# Patient Record
Sex: Female | Born: 1935 | Race: White | Hispanic: No | Marital: Married | Smoking: Former smoker
Health system: Southern US, Community
[De-identification: ages and names within clinical notes are randomized; demographics above are authoritative.]

## PROBLEM LIST (undated history)

## (undated) DIAGNOSIS — R600 Localized edema: Secondary | ICD-10-CM

## (undated) DIAGNOSIS — E039 Hypothyroidism, unspecified: Secondary | ICD-10-CM

## (undated) DIAGNOSIS — M549 Dorsalgia, unspecified: Secondary | ICD-10-CM

## (undated) DIAGNOSIS — R8281 Pyuria: Secondary | ICD-10-CM

## (undated) DIAGNOSIS — D500 Iron deficiency anemia secondary to blood loss (chronic): Secondary | ICD-10-CM

## (undated) DIAGNOSIS — I10 Essential (primary) hypertension: Secondary | ICD-10-CM

## (undated) DIAGNOSIS — M160 Bilateral primary osteoarthritis of hip: Secondary | ICD-10-CM

## (undated) DIAGNOSIS — Z9289 Personal history of other medical treatment: Secondary | ICD-10-CM

## (undated) DIAGNOSIS — K644 Residual hemorrhoidal skin tags: Secondary | ICD-10-CM

## (undated) DIAGNOSIS — K219 Gastro-esophageal reflux disease without esophagitis: Secondary | ICD-10-CM

## (undated) DIAGNOSIS — K921 Melena: Secondary | ICD-10-CM

## (undated) DIAGNOSIS — M47816 Spondylosis without myelopathy or radiculopathy, lumbar region: Secondary | ICD-10-CM

## (undated) DIAGNOSIS — M1990 Unspecified osteoarthritis, unspecified site: Secondary | ICD-10-CM

## (undated) DIAGNOSIS — K625 Hemorrhage of anus and rectum: Secondary | ICD-10-CM

## (undated) DIAGNOSIS — D649 Anemia, unspecified: Secondary | ICD-10-CM

## (undated) DIAGNOSIS — Z8719 Personal history of other diseases of the digestive system: Secondary | ICD-10-CM

## (undated) DIAGNOSIS — Z780 Asymptomatic menopausal state: Secondary | ICD-10-CM

## (undated) DIAGNOSIS — M129 Arthropathy, unspecified: Secondary | ICD-10-CM

## (undated) DIAGNOSIS — J449 Chronic obstructive pulmonary disease, unspecified: Secondary | ICD-10-CM

## (undated) DIAGNOSIS — M810 Age-related osteoporosis without current pathological fracture: Secondary | ICD-10-CM

## (undated) DIAGNOSIS — Z8673 Personal history of transient ischemic attack (TIA), and cerebral infarction without residual deficits: Secondary | ICD-10-CM

## (undated) DIAGNOSIS — Z7901 Long term (current) use of anticoagulants: Secondary | ICD-10-CM

## (undated) DIAGNOSIS — G4700 Insomnia, unspecified: Secondary | ICD-10-CM

## (undated) DIAGNOSIS — M170 Bilateral primary osteoarthritis of knee: Secondary | ICD-10-CM

## (undated) DIAGNOSIS — R609 Edema, unspecified: Secondary | ICD-10-CM

## (undated) DIAGNOSIS — K2960 Other gastritis without bleeding: Secondary | ICD-10-CM

## (undated) DIAGNOSIS — I639 Cerebral infarction, unspecified: Secondary | ICD-10-CM

## (undated) DIAGNOSIS — E0590 Thyrotoxicosis, unspecified without thyrotoxic crisis or storm: Secondary | ICD-10-CM

## (undated) DIAGNOSIS — I480 Paroxysmal atrial fibrillation: Secondary | ICD-10-CM

## (undated) DIAGNOSIS — Z8639 Personal history of other endocrine, nutritional and metabolic disease: Secondary | ICD-10-CM

## (undated) DIAGNOSIS — N189 Chronic kidney disease, unspecified: Secondary | ICD-10-CM

## (undated) HISTORY — PX: BREAST ENHANCEMENT SURGERY: SHX7

## (undated) HISTORY — DX: Age-related osteoporosis without current pathological fracture: M81.0

## (undated) HISTORY — DX: Long term (current) use of anticoagulants: Z79.01

## (undated) HISTORY — DX: Arthropathy, unspecified: M12.9

## (undated) HISTORY — DX: Bilateral primary osteoarthritis of knee: M17.0

## (undated) HISTORY — DX: Thyrotoxicosis, unspecified without thyrotoxic crisis or storm: E05.90

## (undated) HISTORY — DX: Personal history of transient ischemic attack (TIA), and cerebral infarction without residual deficits: Z86.73

## (undated) HISTORY — DX: Melena: K92.1

## (undated) HISTORY — DX: Iron deficiency anemia secondary to blood loss (chronic): D50.0

## (undated) HISTORY — DX: Chronic kidney disease, unspecified: N18.9

## (undated) HISTORY — PX: COLONOSCOPY: SHX174

## (undated) HISTORY — PX: CHOLECYSTECTOMY: SHX55

## (undated) HISTORY — DX: Cerebral infarction, unspecified: I63.9

## (undated) HISTORY — DX: Hemorrhage of anus and rectum: K62.5

## (undated) HISTORY — DX: Spondylosis without myelopathy or radiculopathy, lumbar region: M47.816

## (undated) HISTORY — DX: Insomnia, unspecified: G47.00

## (undated) HISTORY — PX: ABDOMINAL HYSTERECTOMY: SHX81

## (undated) HISTORY — DX: Personal history of other diseases of the digestive system: Z87.19

## (undated) HISTORY — PX: OPERATIVE HYSTEROSCOPY: SUR664

## (undated) HISTORY — DX: Pyuria: R82.81

## (undated) HISTORY — DX: Asymptomatic menopausal state: Z78.0

## (undated) HISTORY — DX: Bilateral primary osteoarthritis of hip: M16.0

## (undated) HISTORY — DX: Localized edema: R60.0

## (undated) HISTORY — DX: Residual hemorrhoidal skin tags: K64.4

## (undated) HISTORY — DX: Other gastritis without bleeding: K29.60

## (undated) HISTORY — DX: Paroxysmal atrial fibrillation: I48.0

## (undated) HISTORY — DX: Edema, unspecified: R60.9

## (undated) HISTORY — PX: WISDOM TOOTH EXTRACTION: SHX21

## (undated) HISTORY — PX: HEMORRHOID SURGERY: SHX153

## (undated) HISTORY — DX: Personal history of other medical treatment: Z92.89

---

## 1999-03-04 ENCOUNTER — Emergency Department (HOSPITAL_COMMUNITY): Admission: EM | Admit: 1999-03-04 | Discharge: 1999-03-04 | Payer: Self-pay

## 2001-05-05 ENCOUNTER — Encounter (INDEPENDENT_AMBULATORY_CARE_PROVIDER_SITE_OTHER): Payer: Self-pay | Admitting: *Deleted

## 2001-05-05 ENCOUNTER — Ambulatory Visit (HOSPITAL_BASED_OUTPATIENT_CLINIC_OR_DEPARTMENT_OTHER): Admission: RE | Admit: 2001-05-05 | Discharge: 2001-05-06 | Payer: Self-pay | Admitting: Surgery

## 2005-09-19 ENCOUNTER — Inpatient Hospital Stay (HOSPITAL_COMMUNITY): Admission: AD | Admit: 2005-09-19 | Discharge: 2005-09-20 | Payer: Self-pay | Admitting: Cardiology

## 2008-08-09 ENCOUNTER — Other Ambulatory Visit: Admission: RE | Admit: 2008-08-09 | Discharge: 2008-08-09 | Payer: Self-pay | Admitting: Surgery

## 2009-03-18 ENCOUNTER — Inpatient Hospital Stay (HOSPITAL_COMMUNITY): Admission: EM | Admit: 2009-03-18 | Discharge: 2009-03-21 | Payer: Self-pay | Admitting: Emergency Medicine

## 2009-03-18 ENCOUNTER — Encounter: Payer: Self-pay | Admitting: Cardiology

## 2010-06-30 DIAGNOSIS — Z8673 Personal history of transient ischemic attack (TIA), and cerebral infarction without residual deficits: Secondary | ICD-10-CM

## 2010-06-30 HISTORY — DX: Personal history of transient ischemic attack (TIA), and cerebral infarction without residual deficits: Z86.73

## 2010-07-11 ENCOUNTER — Ambulatory Visit: Payer: Self-pay | Admitting: Vascular Surgery

## 2010-07-11 ENCOUNTER — Inpatient Hospital Stay (HOSPITAL_COMMUNITY): Admission: EM | Admit: 2010-07-11 | Discharge: 2010-07-13 | Payer: Self-pay | Admitting: Emergency Medicine

## 2010-07-11 ENCOUNTER — Encounter (INDEPENDENT_AMBULATORY_CARE_PROVIDER_SITE_OTHER): Payer: Self-pay | Admitting: Internal Medicine

## 2010-07-13 ENCOUNTER — Encounter (INDEPENDENT_AMBULATORY_CARE_PROVIDER_SITE_OTHER): Payer: Self-pay | Admitting: Neurology

## 2010-07-19 ENCOUNTER — Encounter: Admission: RE | Admit: 2010-07-19 | Discharge: 2010-08-09 | Payer: Self-pay | Admitting: Family Medicine

## 2010-08-01 ENCOUNTER — Ambulatory Visit: Payer: Self-pay | Admitting: Cardiology

## 2010-08-08 ENCOUNTER — Ambulatory Visit: Payer: Self-pay | Admitting: Cardiology

## 2010-08-24 ENCOUNTER — Ambulatory Visit: Payer: Self-pay | Admitting: Cardiology

## 2010-09-08 ENCOUNTER — Ambulatory Visit: Payer: Self-pay | Admitting: Cardiology

## 2010-10-05 ENCOUNTER — Ambulatory Visit: Payer: Self-pay | Admitting: Cardiology

## 2010-10-23 ENCOUNTER — Ambulatory Visit: Payer: Self-pay | Admitting: Internal Medicine

## 2010-10-23 ENCOUNTER — Inpatient Hospital Stay (HOSPITAL_COMMUNITY): Admission: EM | Admit: 2010-10-23 | Discharge: 2010-10-26 | Payer: Self-pay | Admitting: Emergency Medicine

## 2010-11-13 ENCOUNTER — Ambulatory Visit: Payer: Self-pay | Admitting: Cardiology

## 2010-12-05 ENCOUNTER — Inpatient Hospital Stay (HOSPITAL_COMMUNITY)
Admission: EM | Admit: 2010-12-05 | Discharge: 2010-12-08 | Payer: Self-pay | Source: Home / Self Care | Attending: Internal Medicine | Admitting: Internal Medicine

## 2010-12-13 ENCOUNTER — Ambulatory Visit: Payer: Self-pay | Admitting: Cardiology

## 2010-12-20 ENCOUNTER — Ambulatory Visit: Payer: Self-pay | Admitting: Cardiology

## 2011-01-04 ENCOUNTER — Ambulatory Visit: Payer: Self-pay | Admitting: Cardiovascular Disease

## 2011-01-10 ENCOUNTER — Encounter
Admission: RE | Admit: 2011-01-10 | Discharge: 2011-01-10 | Payer: Self-pay | Source: Home / Self Care | Attending: Endocrinology | Admitting: Endocrinology

## 2011-01-19 ENCOUNTER — Ambulatory Visit: Payer: Self-pay | Admitting: Cardiology

## 2011-01-21 ENCOUNTER — Encounter: Payer: Self-pay | Admitting: Endocrinology

## 2011-01-30 ENCOUNTER — Ambulatory Visit: Payer: Self-pay | Admitting: Cardiology

## 2011-02-14 ENCOUNTER — Ambulatory Visit (INDEPENDENT_AMBULATORY_CARE_PROVIDER_SITE_OTHER): Payer: Medicare Other | Admitting: Cardiology

## 2011-02-14 DIAGNOSIS — D509 Iron deficiency anemia, unspecified: Secondary | ICD-10-CM

## 2011-02-14 DIAGNOSIS — I4891 Unspecified atrial fibrillation: Secondary | ICD-10-CM

## 2011-02-14 DIAGNOSIS — I6350 Cerebral infarction due to unspecified occlusion or stenosis of unspecified cerebral artery: Secondary | ICD-10-CM

## 2011-02-14 DIAGNOSIS — Z7901 Long term (current) use of anticoagulants: Secondary | ICD-10-CM

## 2011-03-13 LAB — MAGNESIUM: Magnesium: 1.9 mg/dL (ref 1.5–2.5)

## 2011-03-13 LAB — GLUCOSE, CAPILLARY
Glucose-Capillary: 100 mg/dL — ABNORMAL HIGH (ref 70–99)
Glucose-Capillary: 101 mg/dL — ABNORMAL HIGH (ref 70–99)
Glucose-Capillary: 109 mg/dL — ABNORMAL HIGH (ref 70–99)
Glucose-Capillary: 114 mg/dL — ABNORMAL HIGH (ref 70–99)
Glucose-Capillary: 68 mg/dL — ABNORMAL LOW (ref 70–99)
Glucose-Capillary: 87 mg/dL (ref 70–99)
Glucose-Capillary: 92 mg/dL (ref 70–99)

## 2011-03-13 LAB — CBC
HCT: 22.3 % — ABNORMAL LOW (ref 36.0–46.0)
HCT: 23.5 % — ABNORMAL LOW (ref 36.0–46.0)
HCT: 27.6 % — ABNORMAL LOW (ref 36.0–46.0)
HCT: 29.6 % — ABNORMAL LOW (ref 36.0–46.0)
Hemoglobin: 7.2 g/dL — ABNORMAL LOW (ref 12.0–15.0)
Hemoglobin: 7.5 g/dL — ABNORMAL LOW (ref 12.0–15.0)
MCH: 28.3 pg (ref 26.0–34.0)
MCH: 28.7 pg (ref 26.0–34.0)
MCH: 29.4 pg (ref 26.0–34.0)
MCHC: 31.9 g/dL (ref 30.0–36.0)
MCHC: 32.3 g/dL (ref 30.0–36.0)
MCHC: 32.6 g/dL (ref 30.0–36.0)
MCV: 87.6 fL (ref 78.0–100.0)
MCV: 87.7 fL (ref 78.0–100.0)
MCV: 88.7 fL (ref 78.0–100.0)
MCV: 89 fL (ref 78.0–100.0)
MCV: 91 fL (ref 78.0–100.0)
Platelets: 212 K/uL (ref 150–400)
Platelets: 234 10*3/uL (ref 150–400)
Platelets: 251 10*3/uL (ref 150–400)
Platelets: 268 10*3/uL (ref 150–400)
RBC: 2.65 MIL/uL — ABNORMAL LOW (ref 3.87–5.11)
RBC: 3.38 MIL/uL — ABNORMAL LOW (ref 3.87–5.11)
RDW: 13.6 % (ref 11.5–15.5)
RDW: 14.4 % (ref 11.5–15.5)
WBC: 4.7 K/uL (ref 4.0–10.5)
WBC: 4.8 10*3/uL (ref 4.0–10.5)
WBC: 4.9 10*3/uL (ref 4.0–10.5)
WBC: 6.1 10*3/uL (ref 4.0–10.5)

## 2011-03-13 LAB — BASIC METABOLIC PANEL
BUN: 10 mg/dL (ref 6–23)
CO2: 24 mEq/L (ref 19–32)
CO2: 25 mEq/L (ref 19–32)
Calcium: 9.5 mg/dL (ref 8.4–10.5)
Chloride: 107 mEq/L (ref 96–112)
Creatinine, Ser: 0.96 mg/dL (ref 0.4–1.2)
Creatinine, Ser: 1.01 mg/dL (ref 0.4–1.2)
GFR calc Af Amer: >60 mL/min (ref >60)
GFR calc non Af Amer: 41 mL/min — ABNORMAL LOW (ref >60)
GFR calc non Af Amer: 57 mL/min — ABNORMAL LOW (ref >60)
Potassium: 4 mEq/L (ref 3.5–5.1)
Sodium: 138 mEq/L (ref 135–145)

## 2011-03-13 LAB — DIFFERENTIAL
Basophils Absolute: 0.1 10*3/uL (ref 0.0–0.1)
Basophils Absolute: 0.1 K/uL (ref 0.0–0.1)
Basophils Relative: 1 % (ref 0–1)
Basophils Relative: 1 % (ref 0–1)
Eosinophils Absolute: 0.2 K/uL (ref 0.0–0.7)
Eosinophils Relative: 3 % (ref 0–5)
Eosinophils Relative: 4 % (ref 0–5)
Lymphocytes Relative: 40 % (ref 12–46)
Lymphs Abs: 1.9 10*3/uL (ref 0.7–4.0)
Lymphs Abs: 2.1 10*3/uL (ref 0.7–4.0)
Monocytes Absolute: 0.5 K/uL (ref 0.1–1.0)
Monocytes Relative: 10 % (ref 3–12)
Monocytes Relative: 8 % (ref 3–12)
Neutro Abs: 2.2 K/uL (ref 1.7–7.7)
Neutrophils Relative %: 46 % (ref 43–77)
Neutrophils Relative %: 53 % (ref 43–77)

## 2011-03-13 LAB — COMPREHENSIVE METABOLIC PANEL
AST: 32 U/L (ref 0–37)
Albumin: 3.4 g/dL — ABNORMAL LOW (ref 3.5–5.2)
Alkaline Phosphatase: 27 U/L — ABNORMAL LOW (ref 39–117)
Calcium: 9 mg/dL (ref 8.4–10.5)
Chloride: 110 mEq/L (ref 96–112)
Glucose, Bld: 86 mg/dL (ref 70–99)
Potassium: 3.2 mEq/L — ABNORMAL LOW (ref 3.5–5.1)

## 2011-03-13 LAB — CROSSMATCH
ABO/RH(D): O POS
Antibody Screen: NEGATIVE
Unit division: 0

## 2011-03-13 LAB — BRAIN NATRIURETIC PEPTIDE: Pro B Natriuretic peptide (BNP): 492 pg/mL — ABNORMAL HIGH (ref 0.0–100.0)

## 2011-03-13 LAB — PROTIME-INR
INR: 2.62 — ABNORMAL HIGH (ref 0.00–1.49)
INR: 3.18 — ABNORMAL HIGH (ref 0.00–1.49)
Prothrombin Time: 29.6 seconds — ABNORMAL HIGH (ref 11.6–15.2)
Prothrombin Time: 32.6 seconds — ABNORMAL HIGH (ref 11.6–15.2)

## 2011-03-13 LAB — APTT: aPTT: 64 seconds — ABNORMAL HIGH (ref 24–37)

## 2011-03-14 ENCOUNTER — Encounter (INDEPENDENT_AMBULATORY_CARE_PROVIDER_SITE_OTHER): Payer: Medicare Other

## 2011-03-14 DIAGNOSIS — Z7901 Long term (current) use of anticoagulants: Secondary | ICD-10-CM

## 2011-03-14 DIAGNOSIS — I4891 Unspecified atrial fibrillation: Secondary | ICD-10-CM

## 2011-03-14 LAB — PREPARE FRESH FROZEN PLASMA: Unit division: 0

## 2011-03-14 LAB — DIFFERENTIAL
Basophils Absolute: 0.1 10*3/uL (ref 0.0–0.1)
Basophils Relative: 1 % (ref 0–1)
Eosinophils Absolute: 0.1 10*3/uL (ref 0.0–0.7)
Neutro Abs: 4.9 10*3/uL (ref 1.7–7.7)
Neutrophils Relative %: 64 % (ref 43–77)

## 2011-03-14 LAB — CROSSMATCH
Antibody Screen: NEGATIVE
Unit division: 0
Unit division: 0
Unit division: 0

## 2011-03-14 LAB — VITAMIN B12: Vitamin B-12: 713 pg/mL (ref 211–911)

## 2011-03-14 LAB — BASIC METABOLIC PANEL
BUN: 37 mg/dL — ABNORMAL HIGH (ref 6–23)
CO2: 25 mEq/L (ref 19–32)
CO2: 29 mEq/L (ref 19–32)
Calcium: 9.2 mg/dL (ref 8.4–10.5)
Chloride: 108 mEq/L (ref 96–112)
Creatinine, Ser: 0.85 mg/dL (ref 0.4–1.2)
Creatinine, Ser: 1.04 mg/dL (ref 0.4–1.2)
GFR calc Af Amer: >60 mL/min (ref >60)
Glucose, Bld: 101 mg/dL — ABNORMAL HIGH (ref 70–99)

## 2011-03-14 LAB — COMPREHENSIVE METABOLIC PANEL
ALT: 17 U/L (ref 0–35)
Alkaline Phosphatase: 25 U/L — ABNORMAL LOW (ref 39–117)
BUN: 36 mg/dL — ABNORMAL HIGH (ref 6–23)
CO2: 29 mEq/L (ref 19–32)
Chloride: 106 mEq/L (ref 96–112)
Glucose, Bld: 94 mg/dL (ref 70–99)
Potassium: 4.5 mEq/L (ref 3.5–5.1)
Total Bilirubin: 0.6 mg/dL (ref 0.3–1.2)

## 2011-03-14 LAB — ABO/RH: ABO/RH(D): O POS

## 2011-03-14 LAB — PROTIME-INR
INR: 1.3 (ref 0.00–1.49)
INR: 1.35 (ref 0.00–1.49)
Prothrombin Time: 16.4 seconds — ABNORMAL HIGH (ref 11.6–15.2)
Prothrombin Time: 16.9 seconds — ABNORMAL HIGH (ref 11.6–15.2)

## 2011-03-14 LAB — CBC
HCT: 24.8 % — ABNORMAL LOW (ref 36.0–46.0)
Hemoglobin: 8.5 g/dL — ABNORMAL LOW (ref 12.0–15.0)
Hemoglobin: 9.7 g/dL — ABNORMAL LOW (ref 12.0–15.0)
MCH: 30.6 pg (ref 26.0–34.0)
MCH: 30.9 pg (ref 26.0–34.0)
MCHC: 34.5 g/dL (ref 30.0–36.0)
MCHC: 34.6 g/dL (ref 30.0–36.0)
MCV: 88.5 fL (ref 78.0–100.0)
MCV: 90.2 fL (ref 78.0–100.0)
MCV: 90.5 fL (ref 78.0–100.0)
MCV: 91.7 fL (ref 78.0–100.0)
Platelets: 146 10*3/uL — ABNORMAL LOW (ref 150–400)
Platelets: 149 10*3/uL — ABNORMAL LOW (ref 150–400)
Platelets: 165 10*3/uL (ref 150–400)
Platelets: 173 10*3/uL (ref 150–400)
Platelets: 175 10*3/uL (ref 150–400)
RBC: 2.04 MIL/uL — ABNORMAL LOW (ref 3.87–5.11)
RBC: 2.74 MIL/uL — ABNORMAL LOW (ref 3.87–5.11)
RBC: 2.97 MIL/uL — ABNORMAL LOW (ref 3.87–5.11)
RBC: 3.01 MIL/uL — ABNORMAL LOW (ref 3.87–5.11)
RDW: 15 % (ref 11.5–15.5)
RDW: 15.3 % (ref 11.5–15.5)
RDW: 15.3 % (ref 11.5–15.5)
WBC: 4.9 10*3/uL (ref 4.0–10.5)
WBC: 5.8 10*3/uL (ref 4.0–10.5)
WBC: 7.6 10*3/uL (ref 4.0–10.5)

## 2011-03-14 LAB — URINALYSIS, ROUTINE W REFLEX MICROSCOPIC
Glucose, UA: NEGATIVE mg/dL
Hgb urine dipstick: NEGATIVE
Specific Gravity, Urine: 1.012 (ref 1.005–1.030)
Urobilinogen, UA: 1 mg/dL (ref 0.0–1.0)

## 2011-03-14 LAB — APTT
aPTT: 38 seconds — ABNORMAL HIGH (ref 24–37)
aPTT: 68 seconds — ABNORMAL HIGH (ref 24–37)

## 2011-03-14 LAB — POCT CARDIAC MARKERS
CKMB, poc: <1 ng/mL — ABNORMAL LOW (ref 1.0–8.0)
CKMB, poc: <1 ng/mL — ABNORMAL LOW (ref 1.0–8.0)
Myoglobin, poc: 33.8 ng/mL (ref 12–200)
Myoglobin, poc: 35.5 ng/mL (ref 12–200)
Troponin i, poc: <0.05 ng/mL (ref 0.00–0.09)

## 2011-03-14 LAB — TSH: TSH: 1.244 u[IU]/mL (ref 0.350–4.500)

## 2011-03-14 LAB — PREPARE RBC (CROSSMATCH)

## 2011-03-14 LAB — GLUCOSE, CAPILLARY
Glucose-Capillary: 109 mg/dL — ABNORMAL HIGH (ref 70–99)
Glucose-Capillary: 111 mg/dL — ABNORMAL HIGH (ref 70–99)
Glucose-Capillary: 86 mg/dL (ref 70–99)

## 2011-03-14 LAB — MRSA PCR SCREENING: MRSA by PCR: NEGATIVE

## 2011-03-14 LAB — URINE CULTURE: Culture: NO GROWTH

## 2011-03-14 LAB — IRON AND TIBC
Saturation Ratios: 29 % (ref 20–55)
TIBC: 411 ug/dL (ref 250–470)
UIBC: 293 ug/dL

## 2011-03-14 LAB — FERRITIN: Ferritin: 29 ng/mL (ref 10–291)

## 2011-03-14 LAB — FOLATE: Folate: >20 ng/mL

## 2011-03-18 LAB — URINALYSIS, ROUTINE W REFLEX MICROSCOPIC
Ketones, ur: NEGATIVE mg/dL
Nitrite: NEGATIVE
Protein, ur: NEGATIVE mg/dL
Urobilinogen, UA: 0.2 mg/dL (ref 0.0–1.0)

## 2011-03-18 LAB — GLUCOSE, CAPILLARY: Glucose-Capillary: 130 mg/dL — ABNORMAL HIGH (ref 70–99)

## 2011-03-18 LAB — BASIC METABOLIC PANEL
BUN: 22 mg/dL (ref 6–23)
BUN: 6 mg/dL (ref 6–23)
BUN: 9 mg/dL (ref 6–23)
Creatinine, Ser: 0.81 mg/dL (ref 0.4–1.2)
Creatinine, Ser: 1.55 mg/dL — ABNORMAL HIGH (ref 0.4–1.2)
GFR calc Af Amer: >60 mL/min (ref >60)
GFR calc Af Amer: >60 mL/min (ref >60)
GFR calc non Af Amer: 33 mL/min — ABNORMAL LOW (ref >60)
GFR calc non Af Amer: >60 mL/min (ref >60)
GFR calc non Af Amer: >60 mL/min (ref >60)
Glucose, Bld: 110 mg/dL — ABNORMAL HIGH (ref 70–99)
Potassium: 3.1 mEq/L — ABNORMAL LOW (ref 3.5–5.1)
Potassium: 3.4 mEq/L — ABNORMAL LOW (ref 3.5–5.1)
Sodium: 140 mEq/L (ref 135–145)

## 2011-03-18 LAB — DIFFERENTIAL
Basophils Absolute: 0 10*3/uL (ref 0.0–0.1)
Basophils Relative: 1 % (ref 0–1)
Eosinophils Relative: 3 % (ref 0–5)
Lymphocytes Relative: 31 % (ref 12–46)
Monocytes Absolute: 0.5 10*3/uL (ref 0.1–1.0)
Neutro Abs: 4.3 10*3/uL (ref 1.7–7.7)

## 2011-03-18 LAB — CBC
HCT: 39.6 % (ref 36.0–46.0)
HCT: 40.3 % (ref 36.0–46.0)
HCT: 41.1 % (ref 36.0–46.0)
Hemoglobin: 13.8 g/dL (ref 12.0–15.0)
MCHC: 33.7 g/dL (ref 30.0–36.0)
MCHC: 34.7 g/dL (ref 30.0–36.0)
Platelets: 210 10*3/uL (ref 150–400)
RBC: 4.27 MIL/uL (ref 3.87–5.11)
RDW: 14 % (ref 11.5–15.5)
RDW: 14 % (ref 11.5–15.5)
RDW: 14.2 % (ref 11.5–15.5)
WBC: 6.6 10*3/uL (ref 4.0–10.5)
WBC: 7 10*3/uL (ref 4.0–10.5)
WBC: 7.5 10*3/uL (ref 4.0–10.5)

## 2011-03-18 LAB — LIPID PANEL
LDL Cholesterol: 72 mg/dL (ref 0–99)
Total CHOL/HDL Ratio: 2.9 RATIO
VLDL: 14 mg/dL (ref 0–40)

## 2011-03-18 LAB — PROTIME-INR
INR: 1.09 (ref 0.00–1.49)
INR: 1.09 (ref 0.00–1.49)
Prothrombin Time: 14 seconds (ref 11.6–15.2)

## 2011-03-18 LAB — TROPONIN I: Troponin I: 0.01 ng/mL (ref 0.00–0.06)

## 2011-03-18 LAB — COMPREHENSIVE METABOLIC PANEL
Albumin: 3.8 g/dL (ref 3.5–5.2)
BUN: 18 mg/dL (ref 6–23)
Calcium: 10 mg/dL (ref 8.4–10.5)
Glucose, Bld: 82 mg/dL (ref 70–99)
Total Protein: 6.5 g/dL (ref 6.0–8.3)

## 2011-03-18 LAB — CK TOTAL AND CKMB (NOT AT ARMC)
CK, MB: 1 ng/mL (ref 0.3–4.0)
Relative Index: INVALID (ref 0.0–2.5)
Total CK: 69 U/L (ref 7–177)

## 2011-03-18 LAB — MAGNESIUM
Magnesium: 1.9 mg/dL (ref 1.5–2.5)
Magnesium: 2 mg/dL (ref 1.5–2.5)

## 2011-03-18 LAB — HEMOGLOBIN A1C
Hgb A1c MFr Bld: 5.8 % — ABNORMAL HIGH (ref <5.7)
Mean Plasma Glucose: 120 mg/dL — ABNORMAL HIGH (ref <117)

## 2011-03-18 LAB — APTT: aPTT: 39 seconds — ABNORMAL HIGH (ref 24–37)

## 2011-03-29 ENCOUNTER — Other Ambulatory Visit: Payer: Self-pay | Admitting: Dermatology

## 2011-04-12 LAB — BASIC METABOLIC PANEL
CO2: 26 mEq/L (ref 19–32)
CO2: 27 mEq/L (ref 19–32)
Calcium: 8.9 mg/dL (ref 8.4–10.5)
Calcium: 9.5 mg/dL (ref 8.4–10.5)
Creatinine, Ser: 1.06 mg/dL (ref 0.4–1.2)
GFR calc Af Amer: >60 mL/min (ref >60)
GFR calc Af Amer: >60 mL/min (ref >60)
GFR calc non Af Amer: 51 mL/min — ABNORMAL LOW (ref >60)
GFR calc non Af Amer: 52 mL/min — ABNORMAL LOW (ref >60)
Glucose, Bld: 98 mg/dL (ref 70–99)
Potassium: 3.8 mEq/L (ref 3.5–5.1)
Sodium: 136 mEq/L (ref 135–145)
Sodium: 140 mEq/L (ref 135–145)

## 2011-04-12 LAB — GLUCOSE, CAPILLARY
Glucose-Capillary: 103 mg/dL — ABNORMAL HIGH (ref 70–99)
Glucose-Capillary: 103 mg/dL — ABNORMAL HIGH (ref 70–99)
Glucose-Capillary: 72 mg/dL (ref 70–99)
Glucose-Capillary: 82 mg/dL (ref 70–99)
Glucose-Capillary: 98 mg/dL (ref 70–99)

## 2011-04-12 LAB — PROTIME-INR
INR: 1.1 (ref 0.00–1.49)
INR: 1.1 (ref 0.00–1.49)
INR: 1.2 (ref 0.00–1.49)
Prothrombin Time: 14.1 seconds (ref 11.6–15.2)
Prothrombin Time: 14.5 seconds (ref 11.6–15.2)
Prothrombin Time: 15.7 seconds — ABNORMAL HIGH (ref 11.6–15.2)

## 2011-04-12 LAB — CBC
HCT: 39.1 % (ref 36.0–46.0)
HCT: 42.8 % (ref 36.0–46.0)
Hemoglobin: 13.5 g/dL (ref 12.0–15.0)
Hemoglobin: 13.7 g/dL (ref 12.0–15.0)
MCHC: 34.6 g/dL (ref 30.0–36.0)
MCHC: 34.9 g/dL (ref 30.0–36.0)
MCV: 91.6 fL (ref 78.0–100.0)
MCV: 92.4 fL (ref 78.0–100.0)
Platelets: 205 10*3/uL (ref 150–400)
Platelets: 217 10*3/uL (ref 150–400)
RBC: 4.21 MIL/uL (ref 3.87–5.11)
RBC: 4.22 MIL/uL (ref 3.87–5.11)
RDW: 14.6 % (ref 11.5–15.5)
RDW: 14.7 % (ref 11.5–15.5)
WBC: 7.1 10*3/uL (ref 4.0–10.5)
WBC: 8.2 10*3/uL (ref 4.0–10.5)
WBC: 9.2 10*3/uL (ref 4.0–10.5)

## 2011-04-12 LAB — CK TOTAL AND CKMB (NOT AT ARMC)
CK, MB: 1.5 ng/mL (ref 0.3–4.0)
Relative Index: 1.5 (ref 0.0–2.5)

## 2011-04-12 LAB — POCT I-STAT, CHEM 8
BUN: 19 mg/dL (ref 6–23)
Chloride: 103 mEq/L (ref 96–112)
Creatinine, Ser: 1.3 mg/dL — ABNORMAL HIGH (ref 0.4–1.2)
Glucose, Bld: 102 mg/dL — ABNORMAL HIGH (ref 70–99)
Potassium: 3.4 mEq/L — ABNORMAL LOW (ref 3.5–5.1)

## 2011-04-12 LAB — CARDIAC PANEL(CRET KIN+CKTOT+MB+TROPI)
CK, MB: 1 ng/mL (ref 0.3–4.0)
Relative Index: INVALID (ref 0.0–2.5)
Total CK: 80 U/L (ref 7–177)
Total CK: 85 U/L (ref 7–177)

## 2011-04-12 LAB — LIPID PANEL
HDL: 56 mg/dL (ref >39)
VLDL: 14 mg/dL (ref 0–40)

## 2011-04-12 LAB — COMPREHENSIVE METABOLIC PANEL
Albumin: 3.9 g/dL (ref 3.5–5.2)
BUN: 15 mg/dL (ref 6–23)
Chloride: 105 mEq/L (ref 96–112)
Creatinine, Ser: 1.08 mg/dL (ref 0.4–1.2)
Total Bilirubin: 0.8 mg/dL (ref 0.3–1.2)
Total Protein: 6.1 g/dL (ref 6.0–8.3)

## 2011-04-12 LAB — POCT CARDIAC MARKERS
CKMB, poc: <1 ng/mL — ABNORMAL LOW (ref 1.0–8.0)
Troponin i, poc: <0.05 ng/mL (ref 0.00–0.09)

## 2011-04-12 LAB — DIFFERENTIAL
Basophils Relative: 0 % (ref 0–1)
Eosinophils Absolute: 0.2 10*3/uL (ref 0.0–0.7)
Neutrophils Relative %: 65 % (ref 43–77)

## 2011-04-12 LAB — HEPARIN LEVEL (UNFRACTIONATED)
Heparin Unfractionated: 0.12 IU/mL — ABNORMAL LOW (ref 0.30–0.70)
Heparin Unfractionated: 0.47 IU/mL (ref 0.30–0.70)

## 2011-04-12 LAB — TSH: TSH: 0.735 u[IU]/mL (ref 0.350–4.500)

## 2011-04-12 LAB — BRAIN NATRIURETIC PEPTIDE: Pro B Natriuretic peptide (BNP): 610 pg/mL — ABNORMAL HIGH (ref 0.0–100.0)

## 2011-04-12 LAB — C-REACTIVE PROTEIN: CRP: 0.2 mg/dL — ABNORMAL LOW (ref <0.6)

## 2011-04-12 LAB — APTT: aPTT: 43 seconds — ABNORMAL HIGH (ref 24–37)

## 2011-04-16 ENCOUNTER — Other Ambulatory Visit: Payer: Self-pay | Admitting: Cardiology

## 2011-04-16 DIAGNOSIS — G723 Periodic paralysis: Secondary | ICD-10-CM

## 2011-04-16 DIAGNOSIS — E876 Hypokalemia: Secondary | ICD-10-CM

## 2011-04-17 NOTE — Telephone Encounter (Signed)
Refill per escribe request.  

## 2011-04-18 ENCOUNTER — Encounter: Payer: Medicare Other | Admitting: *Deleted

## 2011-04-23 ENCOUNTER — Ambulatory Visit (INDEPENDENT_AMBULATORY_CARE_PROVIDER_SITE_OTHER): Payer: Medicare Other | Admitting: *Deleted

## 2011-04-23 DIAGNOSIS — I4891 Unspecified atrial fibrillation: Secondary | ICD-10-CM | POA: Insufficient documentation

## 2011-04-23 LAB — POCT INR: INR: 2.7

## 2011-04-30 ENCOUNTER — Other Ambulatory Visit: Payer: Self-pay | Admitting: *Deleted

## 2011-04-30 DIAGNOSIS — Z79899 Other long term (current) drug therapy: Secondary | ICD-10-CM

## 2011-05-15 NOTE — H&P (Signed)
NAMEMARKIA, KYER NO.:  [id number]   MEDICAL RECORD NO.:  [id number]          PATIENT TYPE:  INP   LOCATION:  2040                         FACILITY:  [facility]   PHYSICIAN:  Cassell Clement, M.D. DATE OF BIRTH:  [birth year]   DATE OF ADMISSION:  03/18/2009  DATE OF DISCHARGE:                              HISTORY & PHYSICAL   CHIEF COMPLAINT:  Chest pain and rapid pulse.   HISTORY:  This is a 76 year old Caucasian female who is admitted from  the [facility] Emergency Room.  She awoke at about 5:00 a.m. and noticed that  her heart was racing and she noted some discomfort in the back of her  neck and across her shoulders and subsequently down the back of her left  arm.  The discomfort and rapid pulse persisted and she went to see Dr.  Herb Grays at about 8:00 a.m. and was evaluated and an EKG there showed  her atrial fibrillation with rapid ventricular response.  She was sent  to the emergency room.  By the time she arrived in emergency room, the  chest discomfort had resolved, but she was still in atrial fibrillation.  Her oxygen saturation at Dr. Alda Berthold office was normal at 96%.  The  patient has a past history of atrial fibrillation.  Her last episode  documented was in August 2008.  She had a normal stress Cardiolite in  our office in October 2006, and she had an echocardiogram in our office  in September 2006 showing an ejection fraction of 55-60% with mild  aortic sclerosis.   Her present illness is remarkable for absence of significant dyspnea.  She was slightly nauseated and was diaphoretic when she woke up in  atrial fibrillation this morning.   FAMILY HISTORY:  Both parents died of stroke.   SOCIAL HISTORY:  She is married.  She smokes a half pack of cigarettes a  day.  She drinks a glass of wine perhaps 3 times a week.   HOME MEDICATIONS:  1. Diltiazem CD 180 one daily.  2. Fenofibrate 134 mg daily.  3. Hydrochlorothiazide 25 mg half tablet daily.  4.  Metoprolol 50 mg one twice a day.  5. Miacalcin 1 spray daily and also in nostrils.  6. Trazodone 50 mg half tablet at bedtime p.r.n. for sleep.  7. She takes an over-the-counter laxative stool softener called H2Go      which is a milk of magnesia based product apparently.   The patient is allergic to LIPITOR which cause myalgias.   REVIEW OF SYSTEMS:  She does not have any history of ulcers or GI  bleeding.  She denies any dysuria or hematuria.  She does have an  occasional cough.  All other systems negative in detail.   PHYSICAL EXAMINATION:  VITAL SIGNS:  Her blood pressure is 115/76 and  pulse is 93 in atrial fib.  GENERAL:  The patient is a well-developed, well-nourished woman in no  distress.  She is lying flat comfortably.  She is not experiencing any  pain at the present time.  NECK:  Jugular venous pressure is  normal.  Carotids are normal.  The  thyroid is not enlarged.  There is no lymphadenopathy.  HEENT:  The pupils were equal and reactive.  Sclerae are nonicteric.  Mouth and pharynx are normal.  CHEST:  Clear to percussion and auscultation.  HEART:  No murmur, gallop, rub, or click.  ABDOMEN:  Soft without hepatosplenomegaly or masses.  EXTREMITIES:  No phlebitis or edema.  She has good pedal pulses.  NEUROLOGIC:  Physiologic.  SKIN:  There is no skin rash.   Her EKG on admission shows atrial fib with no ischemic changes.  Chest x-  ray shows borderline cardiomegaly, clear lungs, and no evidence  radiologically of heart failure.   Her blood work shows normal cardiac enzymes, her potassium is low at  3.4, presumably secondary to diuretic.  Her CBC is normal.   IMPRESSION:  1. Chest pain and neck pain with left arm radiation, rule out ischemic      heart disease, rule out myocardial infarction.  2. New onset of recurrent atrial fibrillation.  3. Dyslipidemia by history.  4. Hypokalemia.  5. Cigarette abuse.   DISPOSITION:  We are admitting to telemetry.  We will  try to get a 2-D  echo.  We will treat with IV heparin, IV Cardizem, and aspirin.  We will  begin Coumadin, although she may not need it long-term.  We will  continue beta-blocker in a reduced dose while on IV Cardizem.  We will  replete her potassium.  We will get serial cardiac enzymes.  If she  rules out, we will consider outpatient Cardiolite soon.           ______________________________  Cassell Clement, M.D.     TB/MEDQ  D:  03/18/2009  T:  03/19/2009  Job:  [id number]   cc:   Tammy R. Collins Scotland, M.D.

## 2011-05-15 NOTE — Discharge Summary (Signed)
Lisa [facility]ero, NATZKE NO.:  [id number]   MEDICAL RECORD NO.:  [id number]          PATIENT TYPE:  INP   LOCATION:  2040                         FACILITY:  [facility]   PHYSICIAN:  Cassell Clement, M.D. DATE OF BIRTH:  17-Nov-1935   DATE OF ADMISSION:  03/18/2009  DATE OF DISCHARGE:  03/21/2009                               DISCHARGE SUMMARY   FINAL DIAGNOSES:  1. Paroxysmal atrial fibrillation, resolved.  2. Chest pain, myocardial infarction ruled out.  3. Dyslipidemia.  4. Hypokalemia.  5. Cigarette abuse.   OPERATIONS PERFORMED:  A 2-D echo on March 18, 2009.   HISTORY OF PRESENT ILLNESS:  This is a 76 year old Caucasian female  admitted from the [facility].  She awoke at 5 a.m. on the  morning of admission, noted that her heart was racing and she was also  experiencing discomfort in the back of her neck, across her shoulders,  and down her left arm.  She went to see Dr. Herb Grays about at about 8  a.m. on the day of admission and EKG showed atrial fib with a rapid  ventricular response.  She was sent to the emergency [facility].  By the time  she arrived in the ER, her chest discomfort had resolved, but she was  still in atrial fib.  Her O2 sat at Dr. Alda Berthold office was 96%.  She  does have a past history of paroxysmal atrial fib.  Her last episode of  atrial fib was in August 2008.  She had a normal stress Cardiolite in  our office in October 2006.   PHYSICAL EXAMINATION:  On admission showed blood pressure 115/76 and  pulse was 73, in atrial fibrillation after IV Cardizem in the emergency  [facility].  Carotid pulses were normal.  Thyroid was normal.  The lungs were  clear.  The heart reveals no murmur, gallop, rub, or click.  The abdomen  is soft and nontender.  The extremities show no phlebitis or edema.   HOSPITAL COURSE:  The patient was admitted to telemetry.  She was placed  on IV heparin and begun on oral Coumadin on the day of admission.  We  continued beta-blocker as well as treated her with IV Cardizem.  We  repleted her potassium.  Her 2-D echo showed ejection fraction of 55%  and mild aortic valve sclerosis, but no stenosis.  The patient converted  back to normal sinus rhythm.  Her IV Cardizem was switched back to oral  Cardizem.  IV heparin was continued until March 21, 2009, at which time  her INR was 1.7.  It was felt that she could be safely discharged home  on oral Coumadin.  As noted, serial cardiac enzymes ruled out myocardial  infarction and further evaluation of her presenting symptoms of chest  pain will be done with an outpatient Cardiolite stress test.   ACCESSORY LABORATORY STUDIES:  At discharge, hemoglobin 15.1, hematocrit  42, and white count 7100.  At discharge, sodium 140, potassium 3.8, BUN  13, creatinine 1.04, and blood sugar 98.  INR at discharge is 1.7,  protime 20.4.  Liver function studies on admission were normal.  Cardiac  enzymes negative x3.  Cholesterol was 159, LDL 89, HDL 56, and  triglycerides 70.  Calcium normal.  TSH 0.735 and T4 of 9.5.  C-reactive  protein 0.2.  B-natriuretic peptide 610.  Chest x-ray on admission  showed borderline cardiomegaly with clear lungs and no evidence of CHF,  no effusion, no acute disease.  Her electrocardiogram showed no ischemic  changes.   The patient is being discharged on March 21, 2009, on a low-cholesterol  diet.  She may increase activity as tolerated.  She is to avoid  caffeine.  She will be followed up in the office on March 23, 2009, for  a protime and on March 30, 2009, for an office visit, EKG, protime, and  a BMET.  We will also schedule an outpatient treadmill Cardiolite  sometime in the next several weeks as the schedule permits.  It is  anticipated if she has no further episodes of atrial fib she will remain  on Coumadin for about 1 month and then switch back to aspirin alone.   DISCHARGE MEDICATIONS:  1. Warfarin 5 mg 1 daily or as  directed.  2. Fenofibrate 134 mg daily.  3. Hydrochlorothiazide 12.5 mg daily.  4. Miacalcin nasal spray on alternate nostrils daily.  5. Aspirin 81 mg daily.  6. K-Dur 20 mEq daily.  7. Metoprolol 50 mg 1 twice a day.  8. Diltiazem CD 180 mg 1 daily.  9. Trazodone 50 mg one and half daily at bedtime p.r.n.  10.Nitrostat 1/150 under the tongue p.r.n. for chest pain.   Time spent in preparation of discharge papers, going over medications,  going over dietary instructions, and outpatient plans greater than 30  minutes.           ______________________________  Cassell Clement, M.D.     TB/MEDQ  D:  03/21/2009  T:  03/21/2009  Job:  [id number]   cc:   Tammy R. Collins Scotland, M.D.

## 2011-05-16 ENCOUNTER — Encounter: Payer: Self-pay | Admitting: *Deleted

## 2011-05-17 ENCOUNTER — Encounter: Payer: Self-pay | Admitting: Cardiology

## 2011-05-17 ENCOUNTER — Ambulatory Visit (INDEPENDENT_AMBULATORY_CARE_PROVIDER_SITE_OTHER): Payer: Medicare Other | Admitting: *Deleted

## 2011-05-17 ENCOUNTER — Encounter: Payer: Self-pay | Admitting: *Deleted

## 2011-05-17 ENCOUNTER — Ambulatory Visit (INDEPENDENT_AMBULATORY_CARE_PROVIDER_SITE_OTHER): Payer: Medicare Other | Admitting: Cardiology

## 2011-05-17 ENCOUNTER — Other Ambulatory Visit (INDEPENDENT_AMBULATORY_CARE_PROVIDER_SITE_OTHER): Payer: Medicare Other | Admitting: *Deleted

## 2011-05-17 ENCOUNTER — Telehealth: Payer: Self-pay | Admitting: Cardiology

## 2011-05-17 DIAGNOSIS — I4891 Unspecified atrial fibrillation: Secondary | ICD-10-CM

## 2011-05-17 DIAGNOSIS — E0590 Thyrotoxicosis, unspecified without thyrotoxic crisis or storm: Secondary | ICD-10-CM | POA: Insufficient documentation

## 2011-05-17 DIAGNOSIS — I119 Hypertensive heart disease without heart failure: Secondary | ICD-10-CM

## 2011-05-17 DIAGNOSIS — Z7901 Long term (current) use of anticoagulants: Secondary | ICD-10-CM | POA: Insufficient documentation

## 2011-05-17 DIAGNOSIS — Z8673 Personal history of transient ischemic attack (TIA), and cerebral infarction without residual deficits: Secondary | ICD-10-CM | POA: Insufficient documentation

## 2011-05-17 DIAGNOSIS — E785 Hyperlipidemia, unspecified: Secondary | ICD-10-CM | POA: Insufficient documentation

## 2011-05-17 DIAGNOSIS — Z79899 Other long term (current) drug therapy: Secondary | ICD-10-CM

## 2011-05-17 LAB — BASIC METABOLIC PANEL
BUN: 24 mg/dL — ABNORMAL HIGH (ref 6–23)
CO2: 30 mEq/L (ref 19–32)
Calcium: 9.7 mg/dL (ref 8.4–10.5)
Chloride: 102 mEq/L (ref 96–112)
Creatinine, Ser: 1.5 mg/dL — ABNORMAL HIGH (ref 0.4–1.2)
Glucose, Bld: 100 mg/dL — ABNORMAL HIGH (ref 70–99)

## 2011-05-17 NOTE — Telephone Encounter (Signed)
PT SAID SHE WAS TO CALL SUSIE BACK ABOUT A PILL.

## 2011-05-17 NOTE — Assessment & Plan Note (Signed)
The patient is doing well with her Coumadin anticoagulation.  Her INR is 2.7 today.  She has not been experiencing any bleeding problems.  She has had no TIA symptoms.  She does have a history of a remote stroke.

## 2011-05-17 NOTE — Progress Notes (Signed)
Lisa [facility]ero Date of Birth:  Jul 29, 1935 [facility] [address]   [address] [place], [place]  [address] [phone]           Fax   [phone]  HPI: This pleasant 76 year old woman is seen for a scheduled followup office visit.  She has a history of paroxysmal atrial fibrillation.  She was hospitalized at [facility] in March of 2010 for atrial fibrillation.  At that time she converted on IV Cardizem promptly back to sinus rhythm.  She remained on warfarin for several months and then was stopped.  However, in July 2011 she was hospitalized with an embolic stroke at that time she was placed back on long-term Coumadin which she has remained on.  In October 2011 she was admitted briefly to [facility] with an upper GI bleed secondary to erosive duodenitis.  Her Coumadin was stopped briefly and then she was placed back on Coumadin.  She is now on long-term protime pump inhibitor to prevent further problems with her stomach.  She has not been expressing any hematochezia or melena.  She denies any chest pain or shortness of breath.  She's had no recent peripheral edema  Current Outpatient Prescriptions  Medication Sig Dispense Refill  . amLODipine (NORVASC) 2.5 MG tablet Take 2.5 mg by mouth daily. Taking 5 mg. daily      . calcitonin, salmon, (MIACALCIN/FORTICAL) 200 UNIT/ACT nasal spray 1 spray by Nasal route daily.        Marland Kitchen diltiazem (DILACOR XR) 180 MG 24 hr capsule Take 180 mg by mouth daily.        . fenofibrate 54 MG tablet Take 54 mg by mouth daily.        . IRON PO Take by mouth.        Marland Kitchen KLOR-CON M10 10 MEQ tablet TAKE 1 TABLET BY MOUTH TWICE A DAY  60 tablet  5  . levothyroxine (SYNTHROID, LEVOTHROID) 50 MCG tablet Take 50 mcg by mouth daily.        . metoprolol (LOPRESSOR) 50 MG tablet Take 50 mg by mouth 2 (two) times daily.        . pantoprazole (PROTONIX) 40 MG tablet Take 40 mg by mouth 2 (two) times daily.        . traMADol (ULTRAM)  50 MG tablet Take 50 mg by mouth daily.        . traZODone (DESYREL) 50 MG tablet Take 75 mg by mouth at bedtime.        Marland Kitchen warfarin (COUMADIN) 5 MG tablet Take 5 mg by mouth daily. Take as directed        Allergies  Allergen Reactions  . Lipitor (Atorvastatin Calcium)     myalgias    Patient Active Problem List  Diagnoses  . Atrial fibrillation  . History of embolic stroke  . Hyperthyroidism  . Long-term (current) use of anticoagulants  . Benign hypertensive heart disease without heart failure  . Dyslipidemia    History  Smoking status  . Former Smoker  Smokeless tobacco  . Not on file    History  Alcohol Use: Not on file    Family History  Problem Relation Age of Onset  . Stroke Mother   . Dementia Father     Review of Systems: The patient denies any heat or cold intolerance.  No weight gain or weight loss.  The patient denies headaches or blurry vision.  There is no cough or  sputum production.  The patient denies dizziness.  There is no hematuria or hematochezia.  The patient denies any muscle aches or arthritis.  The patient denies any rash.  The patient denies frequent falling or instability.  There is no history of depression or anxiety.  All other systems were reviewed and are negative.   Physical Exam: Filed Vitals:   05/17/11 1108  BP: 130/80  Pulse: 60  The general appearance reveals a well-developed well-nourished elderly woman in no acute distress.Pupils equal and reactive.   Extraocular Movements are full.  There is no scleral icterus.  The mouth and pharynx are normal.  The neck is supple.  The carotids reveal no bruits.  The jugular venous pressure is normal.  The thyroid is not enlarged.  There is no lymphadenopathy.The chest is clear to percussion and auscultation. There are no rales or rhonchi. Expansion of the chest is symmetrical.The precordium is quiet.  The first heart sound is normal.  The second heart sound is physiologically split.  There is no  murmur gallop rub or click.  There is no abnormal lift or heave.The abdomen is soft and nontender. Bowel sounds are normal. The liver and spleen are not enlarged. There Are no abdominal masses. There are no bruits.The pedal pulses are good.  There is no phlebitis or edema.  There is no cyanosis or clubbing.Strength is normal and symmetrical in all extremities.  There is no lateralizing weakness.  There are no sensory deficits.The skin is warm and dry.  There is no rash.    Assessment / Plan: Continue same medication.  Return for monthly protimes and recheck for an office visit in 3 months

## 2011-05-17 NOTE — Assessment & Plan Note (Signed)
The patient has a past history of paroxysmal atrial fibrillation.  She is on long-term Coumadin.  Since last visit she's had no recurrent atrial fibrillation or palpitations.  She is walking regularly for exercise.

## 2011-05-17 NOTE — Assessment & Plan Note (Signed)
The patient has a past history of essential hypertension.  She had been previously having problems with peripheral edema from her amlodipine but this seems to have improved since her dose was reduced.  She's not having any headaches or dizziness

## 2011-05-18 NOTE — Discharge Summary (Signed)
[name], Lisa [facility]ero NO.:  [id number]   MEDICAL RECORD NO.:  [id number]          PATIENT TYPE:  INP   LOCATION:  3739                         FACILITY:  [facility]   PHYSICIAN:  Cassell Clement, M.D. DATE OF BIRTH:  27-Sep-1935   DATE OF ADMISSION:  09/19/2005  DATE OF DISCHARGE:  09/20/2005                                 DISCHARGE SUMMARY   FINAL DIAGNOSES:  1.  Paroxysmal atrial fibrillation, resolved.  2.  Hypercholesterolemia by history.  3.  Throat pain, possible anginal equivalent, resolved.   HISTORY:  This 76 year old married Caucasian female with a past history of  paroxysmal atrial fib was admitted after appearing at Dr. Alda Berthold office  earlier on September 19, 2005 in atrial fib with rapid ventricular response.  She denied chest pain but was feeling a tightness in her jaw and neck.  She  has had a past history of paroxysmal atrial fib, but the episodes have  become more frequent recently.  The patient smokes a half a pack of  cigarettes a day.  She drinks a moderate amount of caffeine and has a small  glass of wine with supper.   PRESENT MEDICATIONS:  1.  Diltiazem LA 180 b.i.d.  2.  Hydrochlorothiazide 25 mg a half tablet daily.  3.  Weekly Fosamax.   ALLERGIES:  She states she is allergic to LIPITOR, which cause myalgias, but  in the past had taken Crestor without side-effects.  She is not on any lipid-  lowering medication at the present time.   PHYSICAL EXAM ON ADMISSION:  VITAL SIGNS:  Her blood pressure on admission  is 130/70.  On arrival at the floor, she was back in sinus rhythm at 60 per  minute and apparently had converted on the way to the hospital.  CHEST:  Clear.  HEART:  Reveals no murmur, gallop or rub.  ABDOMEN:  Negative.  EXTREMITIES:  Negative.   ACCESSORY CLINICAL DATA:  Her repeat electrocardiogram shows no ischemic  changes and she does have an incomplete right bundle branch block.   HOSPITAL COURSE:  The patient had an  uneventful hospital course.  She was  kept on IV heparin overnight.  A beta blocker was added to her regimen.  She  was counseled regarding smoking cessation.   LABORATORY STUDIES:  Her laboratory studies included a normal troponin and  CK-MB.  Hemoglobin 12.7, hematocrit 36,  white count 7600, platelet count  207,000.  Sodium 139, potassium 3.5, BUN 13, creatinine 1.0, blood sugar  144, but a nonfasting specimen.  Beta natriuretic peptide slightly elevated  at 226.  Thyroid function studies were normal.  Fasting lipid panel drawn on  the morning of discharge is not yet reported back.   DISPOSITION:  The patient is stable for discharge on September 20, 2005.   DISCHARGE MEDICATIONS:  She is being discharged on:  1.  Metoprolol 50 mg a half a tablet b.i.d.  2.  Cardizem CD 180 mg once a day.  3.  Coated aspirin 81 mg daily.  4.  Hydrochlorothiazide 25 mg half a tablet daily.  5.  To resume weekly Fosamax.   FOLLOWUP:  She is to see Dr. Patty Sermons in the office in 7-10 days for an  office visit and 2-D echo.  At that time, we will also discuss possible  further ischemic workup with a Cardiolite stress test, in view of her  history of throat pain with tachycardia.   DISCHARGE DIET:  She was instructed about a low-cholesterol diet.   DISCHARGE INSTRUCTIONS:  She is to avoid excessive caffeine and she is  counseled about smoking cessation.   CONDITION ON DISCHARGE:  Improved.           ______________________________  Cassell Clement, M.D.     TB/MEDQ  D:  09/20/2005  T:  09/20/2005  Job:  [id number]   cc:   Tammy R. Collins Scotland, M.D.  [address] [place]  [place] [address]  Fax: [phone]

## 2011-05-18 NOTE — H&P (Signed)
Lisa [facility], Lisa [facility] NO.:  [id number]   MEDICAL RECORD NO.:  [id number]          PATIENT TYPE:  INP   LOCATION:  3739                         FACILITY:  [facility]   PHYSICIAN:  Cassell Clement, M.D. DATE OF BIRTH:  Jul 30, 1935   DATE OF ADMISSION:  09/19/2005  DATE OF DISCHARGE:                                HISTORY & PHYSICAL   CHIEF COMPLAINT:  Rapid heart action and throat pain.   HISTORY:  This is a 76 year old married Caucasian female with a past history  of paroxysmal atrial fibrillation. We first saw Lisa [facility] for atrial  fibrillation in 1999. We last saw her in the office and 2003. She has a  history of paroxysmal atrial fibrillation. Her last echocardiogram done in  our office on October 22, 2002, had shown left atrial size at upper limit of  normal at 40 mm, and she had normal left ventricular systolic function with  mildly decreased left ventricular compliance and trace tricuspid  regurgitation. The patient has a history of borderline hypertension. She  also has a history of hypercholesterolemia. She previously had a trial of  Lipitor but developed myalgias. She reports that she was then switched by  [facility] to Crestor and appeared to be tolerating that  okay but was unable to get refills and stopped taking it. She has  subsequently switched physicians and now is seeing Dr. Herb Grays. The  patient has not been experiencing any precordial chest discomfort but has  noted some intermittent tightness in her neck and jaw area, possibly an  anginal equivalent. She feels that she has been having more frequent  episodes of paroxysmal atrial fibrillation recently. This morning she awoke  early with severe dyspnea and exertional fatigue and noted that her heart  was racing. She went to see Dr. Collins Scotland in the office where her  electrocardiogram confirms rapid atrial fibrillation, and she was referred  for admission and further evaluation. Upon  arrival in her room, her  electrocardiogram here showed normal sinus rhythm.   FAMILY HISTORY:  Negative for paroxysmal atrial fibrillation but is positive  for coronary heart disease. Her mother died at 27 of natural causes. Father  died of Alzheimer's at 66.   SOCIAL HISTORY:  She smokes less than one-half pack cigarettes a day. She  drinks a small glass of wine with supper. She has three cups of coffee each  morning which are half irregular and half decaf.  The patient is retired.  She walks for exercise.   PRESENT MEDICATIONS:  1.  Diltiazem LA 180 mg twice a day.  2.  Hydrochlorothiazide 25 mg 1/2 tablet daily.  3.  Fosamax once a week.   ALLERGIES:  She is allergic to LIPITOR which cause myalgias but apparently  was able to take Crestor.   REVIEW OF SYSTEMS:  Reveals that she has experienced a lot of postprandial  gas. She does not have any known cholelithiasis problems. She denies any  dysuria or kidney problems. She denies any smoker's cough or sputum  production or hemoptysis. She has a remote history of pericarditis.  Her operations include hysterectomy, hemorrhoidectomy and recent vein  surgery on the left leg.   REVIEW OF SYSTEMS:  Remainder of Review of Systems is negative in detail.   PHYSICAL EXAMINATION:  VITAL SIGNS: Blood pressure is 130/70, pulse 60.  HEENT:  Negative.  CHEST:  Clear.  HEART:  Reveals a quiet precordium without murmur, gallop, rub or click.  ABDOMEN: The abdomen is soft without hepatosplenomegaly or masses.  EXTREMITIES:  Show good peripheral pulses. No phlebitis or edema.   Her first electrocardiogram done in Dr. Alda Berthold office shows atrial  fibrillation with rapid ventricular response and no ischemic changes.   EKG #2 done on arrival here shows normal sinus rhythm at 67 per minute and  incomplete right bundle branch block, left atrial enlargement, and no  ischemic changes.   Chest x-ray is pending.   Laboratory work so far is  normal, but thyroid functions are not yet back,  and the B-natruretic peptide is pending.  Lipid panel is pending.   IMPRESSION:  1.  Paroxysmal atrial fibrillation, resolved.  2.  Throat pain during periods of tachycardia, possibly anginal equivalent.  3.  History of hypercholesterolemia.  4.  Cigarette abuse.   DISPOSITION:  1.  We are admitting overnight for observation.  2.  She will receive IV heparin overnight.  3.  We will and a beta blocker to try to prevent future episodes of atrial      fibrillation and will simultaneously cut back on her oral diltiazem to      just 180 mg once a day to allow her to tolerate the beta blocker.  4.  We will counsel on smoking cessation.  5.  Anticipate if she remains in sinus rhythm tonight, we will probably      discharge her tomorrow and plan for outpatient evaluation of      echocardiogram and probably subsequent stress Cardiolite to rule out      underlying ischemic problems.           ______________________________  Cassell Clement, M.D.     TB/MEDQ  D:  09/19/2005  T:  09/19/2005  Job:  [id number]   cc:   Tammy R. Collins Scotland, M.D.  [address] [place]  [place] [address]  Fax: [phone]

## 2011-05-18 NOTE — Op Note (Signed)
[employer]  Patient:    Lisa Lucero, SPEAK                      MRN: [id number] Proc. Date: 05/05/01 Adm. Date:  [id number] Attending:  Katha Cabal CC:         Tammy R. Collins Scotland, M.D.   Operative Report  CCS [phone].  PREOPERATIVE DIAGNOSES: 1. Silicone breast implants from the 1970s with palpable mass in left breast    and also skin nevus on the left medial breast. 2. Posterior hemorrhoidal prolapse with hemorrhoids.  PROCEDURES: 1. Left breast biopsy. 2. Excision of nevus, left breast. 3. Hemorrhoidectomy.  SURGEON:  Thornton Park. Daphine Deutscher, M.D.  ANESTHESIA:  General by LMA.  DISPOSITION:  To overnight recovery care center at [facility].  DESCRIPTION OF PROCEDURE:  Ms. Lievanos was taken to room 8 and given general and placed in the lithotomy position.  Preoperatively I marked the left breast, and the palpable BB-size mass was firm.  It was in about the 4 oclock position behind her areola.  Because of her implants, no sharp objects were inserted into the breast, and after making a tiny incision overlying this mass, I used blunt dissection and scissors to dissect out a hard, BB-size mass, which was removed in toto and sent for permanent sections.  I closed the breast with 4-0 and 5-0 Vicryl subcutaneously and subcuticularly and subsequent benzoin and Steri-Strips.  In the meanwhile, the patient had asked me about an area medially in her left breast, which initially looked like more of a seborrheic keratosis.  There was, however, a pigmented portion which lent itself to an elliptical incision.  However, there was another portion that looked more like seborrheic keratosis, would have made this require a large excision, so what I elected to do was to do a scraping of the inferior medial part of this and then excise the pigmented portion.  This was excised and closed with 5-0 Vicryl with benzoin and Steri-Strips.  Then sterile  dressings were applied.  She was placed up in the dorsal lithotomy position.  The area was prepped widely with Betadine and draped sterilely.  With her in this position, she had prominent prolapse of hemorrhoids kind of at the 8 oclock position and at the 5 oclock position and skin tags posteriorly.  My approach to this was to get purchases of this first on the patients right side and excise the excessive internal hemorrhoidal tissue, then excise the external hemorrhoidal tissue, and close the inside with a running locking suture of 3-0 chromic.  The outside was then approximated up to the skin line with interrupted 3-0 chromic sutures. Similarly, on the other side I excised a large hemorrhoidal column and closed the internal hemorrhoids with running locking suture internally after again placing a sentinel suture using a figure-of-eight technique.  This seemed to control the bleeding.  Again the excess skin had been excised and was closed separately with interrupted simple sutures of 3-0 chromic.  Betadine ointment was massaged into the area.  This really seemed to take care of the internal-external hemorrhoid components, particularly posteriorly.  I had previously injected that area with 1% lidocaine and Marcaine mixture with epinephrine.  In addition, the patient had received Ancef prior to the breast biopsy.  She seemed to tolerate the procedure well.  Because I felt like she may be more uncomfortable, I elected to try to get a room for her in the  recovery care center for overnight observation.  FINAL DIAGNOSES: 1. Left breast mass. 2. Hemorrhoids, status post excision. DD:  05/05/01 TD:  05/06/01 Job: [id number]

## 2011-05-21 ENCOUNTER — Telehealth: Payer: Self-pay | Admitting: *Deleted

## 2011-05-21 ENCOUNTER — Encounter: Payer: Medicare Other | Admitting: *Deleted

## 2011-05-21 NOTE — Telephone Encounter (Signed)
Adv. Pt of labs , try to increase water intake

## 2011-06-14 ENCOUNTER — Ambulatory Visit (INDEPENDENT_AMBULATORY_CARE_PROVIDER_SITE_OTHER): Payer: Medicare Other | Admitting: *Deleted

## 2011-06-14 DIAGNOSIS — I4891 Unspecified atrial fibrillation: Secondary | ICD-10-CM

## 2011-06-28 ENCOUNTER — Telehealth: Payer: Self-pay | Admitting: Cardiology

## 2011-06-28 NOTE — Telephone Encounter (Signed)
Pt wants to talk to you about Dr Patty Sermons filling out concealed weapon form Her PCP wouldn't fill out for her please call

## 2011-06-28 NOTE — Telephone Encounter (Signed)
Discussed with Dr. Patty Sermons and he said needs to come from PCP.  Advised patient

## 2011-07-05 ENCOUNTER — Other Ambulatory Visit: Payer: Self-pay | Admitting: Endocrinology

## 2011-07-05 DIAGNOSIS — E049 Nontoxic goiter, unspecified: Secondary | ICD-10-CM

## 2011-07-12 ENCOUNTER — Ambulatory Visit (INDEPENDENT_AMBULATORY_CARE_PROVIDER_SITE_OTHER): Payer: Medicare Other | Admitting: *Deleted

## 2011-07-12 DIAGNOSIS — I4891 Unspecified atrial fibrillation: Secondary | ICD-10-CM

## 2011-07-17 ENCOUNTER — Telehealth: Payer: Self-pay | Admitting: Cardiology

## 2011-07-17 DIAGNOSIS — I119 Hypertensive heart disease without heart failure: Secondary | ICD-10-CM

## 2011-07-17 MED ORDER — AMLODIPINE BESYLATE 5 MG PO TABS: 5.0000 mg | ORAL_TABLET | Freq: Every day | ORAL | Status: DC

## 2011-07-17 NOTE — Telephone Encounter (Signed)
Spoke with patient and she has been taking norvasc 10 mg 1/2 tablet daily, chart said 5 mg 1/2 daily.  Changed in chart and called in 5 mg tablets daily so she would not have to break tablet, ok with patient.

## 2011-07-17 NOTE — Telephone Encounter (Signed)
Called in wonder in if she had been taken off of her prescription of Amlodipine. If not can it be refilled at [facility] in [place] [phone]. Please call back. I have pulled her chart.

## 2011-08-09 ENCOUNTER — Encounter: Payer: Medicare Other | Admitting: *Deleted

## 2011-08-17 ENCOUNTER — Ambulatory Visit (INDEPENDENT_AMBULATORY_CARE_PROVIDER_SITE_OTHER): Payer: Medicare Other | Admitting: Cardiology

## 2011-08-17 ENCOUNTER — Encounter: Payer: Self-pay | Admitting: Cardiology

## 2011-08-17 ENCOUNTER — Ambulatory Visit (INDEPENDENT_AMBULATORY_CARE_PROVIDER_SITE_OTHER): Payer: Medicare Other | Admitting: *Deleted

## 2011-08-17 VITALS — BP 100/70 | HR 52 | Wt 124.0 lb

## 2011-08-17 DIAGNOSIS — K219 Gastro-esophageal reflux disease without esophagitis: Secondary | ICD-10-CM

## 2011-08-17 DIAGNOSIS — I4891 Unspecified atrial fibrillation: Secondary | ICD-10-CM

## 2011-08-17 DIAGNOSIS — E785 Hyperlipidemia, unspecified: Secondary | ICD-10-CM

## 2011-08-17 DIAGNOSIS — I119 Hypertensive heart disease without heart failure: Secondary | ICD-10-CM

## 2011-08-17 DIAGNOSIS — E781 Pure hyperglyceridemia: Secondary | ICD-10-CM

## 2011-08-17 NOTE — Progress Notes (Signed)
Lisa Lucero Date of Birth:  Mar 13, 1935 [facility] [address]   [address] [place], [place]  [address] [phone]           Fax   [phone]  History of Present Illness: This pleasant 76 year old Caucasian female is seen for a scheduled followup office visit.  She has a past history of paroxysmal atrial fibrillation.  She was hospitalized in March of 2010 for atrial fibrillation and converted on IV Cardizem back to sinus rhythm promptly.  She remained on warfarin for several months and then the warfarin was stopped because she was maintaining sinus rhythm.  However in July 2011 she was hospitalized with an embolic stroke and was placed on long-term Coumadin and has remained on Coumadin.  Her Coumadin was held briefly in October 2011 when she was admitted to [facility] with an upper GI bleed secondary to erosive duodenitis.  She is now on a long-term proton pump inhibitor to prevent further GI problems.  She has not been experiencing any awareness of palpitations.  He has had mild occasional dizziness.  Current Outpatient Prescriptions  Medication Sig Dispense Refill  . calcitonin, salmon, (MIACALCIN/FORTICAL) 200 UNIT/ACT nasal spray 1 spray by Nasal route daily.        Marland Kitchen diltiazem (DILACOR XR) 180 MG 24 hr capsule Take 180 mg by mouth daily.        . fenofibrate 54 MG tablet Take 54 mg by mouth daily.        Marland Kitchen gabapentin (NEURONTIN) 100 MG tablet Take 100 mg by mouth as directed. Dr. Sonia Baller       . IRON PO Take by mouth.        Marland Kitchen KLOR-CON M10 10 MEQ tablet TAKE 1 TABLET BY MOUTH TWICE A DAY  60 tablet  5  . levothyroxine (SYNTHROID, LEVOTHROID) 50 MCG tablet Take 50 mcg by mouth daily.        . metoprolol (LOPRESSOR) 50 MG tablet Take 50 mg by mouth 2 (two) times daily.        . pantoprazole (PROTONIX) 40 MG tablet Take 40 mg by mouth daily.       . traMADol (ULTRAM) 50 MG tablet Take 50 mg by mouth daily.        Marland Kitchen warfarin (COUMADIN) 5 MG tablet  Take 5 mg by mouth daily. Take as directed        Allergies  Allergen Reactions  . Lipitor (Atorvastatin Calcium)     myalgias    Patient Active Problem List  Diagnoses  . Atrial fibrillation  . History of embolic stroke  . Hyperthyroidism  . Long-term (current) use of anticoagulants  . Benign hypertensive heart disease without heart failure  . Dyslipidemia    History  Smoking status  . Former Smoker  Smokeless tobacco  . Not on file    History  Alcohol Use: Not on file    Family History  Problem Relation Age of Onset  . Stroke Mother   . Dementia Father     Review of Systems: Constitutional: no fever chills diaphoresis or fatigue or change in weight.  Head and neck: no hearing loss, no epistaxis, no photophobia or visual disturbance. Respiratory: No cough, shortness of breath or wheezing. Cardiovascular: No chest pain peripheral edema, palpitations. Gastrointestinal: No abdominal distention, no abdominal pain, no change in bowel habits hematochezia or melena. Genitourinary: No dysuria, no frequency, no urgency, no nocturia. Musculoskeletal:No arthralgias, no back pain, no gait disturbance  or myalgias. Neurological: No dizziness, no headaches, no numbness, no seizures, no syncope, no weakness, no tremors. Hematologic: No lymphadenopathy, no easy bruising. Psychiatric: No confusion, no hallucinations, no sleep disturbance.    Physical Exam: Filed Vitals:   08/17/11 1126  BP: 100/70  Pulse: 52  The general appearance feels a well-developed well-nourished woman in no distress.Pupils equal and reactive.   Extraocular Movements are full.  There is no scleral icterus.  The mouth and pharynx are normal.  The neck is supple.  The carotids reveal no bruits.  The jugular venous pressure is normal.  The thyroid is not enlarged.  There is no lymphadenopathy.  The chest is clear to percussion and auscultation. There are no rales or rhonchi. Expansion of the chest is  symmetrical.  The precordium is quiet.  The first heart sound is normal.  The second heart sound is physiologically split.  There is no murmur gallop rub or click.  There is no abnormal lift or heave.  The abdomen is soft and nontender. Bowel sounds are normal. The liver and spleen are not enlarged. There Are no abdominal masses. There are no bruits.    Normal extremity without phlebitis or edema.  Pedal pulses are good.Strength is normal and symmetrical in all extremities.  There is no lateralizing weakness.  There are no sensory deficits.  The skin is warm and dry.  There is no rash.She does have easy bruising from her Coumadin.     Assessment / Plan: I believe that her dizziness probably is related to her low blood pressure and we will stop her amlodipine at this point and observe response.  Continue same Coumadin.  Recheck in one month for protime and in 4 months for followup office visit.

## 2011-08-17 NOTE — Assessment & Plan Note (Addendum)
The patient has been experiencing no problems from her Fenofibrate.  She denies any myalgias .

## 2011-08-17 NOTE — Assessment & Plan Note (Signed)
The patient has had no recurrence of her atrial fibrillation.  She's not having any complications from her Coumadin anticoagulation.  Her INR today is 2.7 and she will continue same Coumadin

## 2011-08-17 NOTE — Assessment & Plan Note (Addendum)
The patient has a past history of essential hypertension.  Recently her blood pressures have been running low normal and she has been having some dizzy spells.  Her blood pressure today is 100/70 And we are going to have her stop taking her amlodipine.  She is also had some intermittent ankle edema probably secondary to amlodipine

## 2011-09-07 ENCOUNTER — Other Ambulatory Visit: Payer: Self-pay | Admitting: Cardiology

## 2011-09-07 NOTE — Telephone Encounter (Signed)
Refilled wafarin

## 2011-09-17 ENCOUNTER — Ambulatory Visit (INDEPENDENT_AMBULATORY_CARE_PROVIDER_SITE_OTHER): Payer: Medicare Other | Admitting: *Deleted

## 2011-09-17 DIAGNOSIS — I4891 Unspecified atrial fibrillation: Secondary | ICD-10-CM

## 2011-09-17 LAB — POCT INR: INR: 2.4

## 2011-10-15 ENCOUNTER — Ambulatory Visit (INDEPENDENT_AMBULATORY_CARE_PROVIDER_SITE_OTHER): Payer: Medicare Other | Admitting: *Deleted

## 2011-10-15 DIAGNOSIS — I4891 Unspecified atrial fibrillation: Secondary | ICD-10-CM

## 2011-10-15 LAB — POCT INR: INR: 2.6

## 2011-11-26 ENCOUNTER — Ambulatory Visit (INDEPENDENT_AMBULATORY_CARE_PROVIDER_SITE_OTHER): Payer: Medicare Other | Admitting: *Deleted

## 2011-11-26 DIAGNOSIS — I4891 Unspecified atrial fibrillation: Secondary | ICD-10-CM

## 2011-11-26 LAB — POCT INR: INR: 1.9

## 2011-12-16 ENCOUNTER — Other Ambulatory Visit: Payer: Self-pay | Admitting: Cardiology

## 2011-12-21 ENCOUNTER — Ambulatory Visit (INDEPENDENT_AMBULATORY_CARE_PROVIDER_SITE_OTHER): Payer: Medicare Other | Admitting: Cardiology

## 2011-12-21 ENCOUNTER — Encounter: Payer: Self-pay | Admitting: Cardiology

## 2011-12-21 ENCOUNTER — Encounter: Payer: Medicare Other | Admitting: *Deleted

## 2011-12-21 ENCOUNTER — Ambulatory Visit (INDEPENDENT_AMBULATORY_CARE_PROVIDER_SITE_OTHER): Payer: Medicare Other | Admitting: *Deleted

## 2011-12-21 VITALS — BP 140/98 | HR 60 | Ht 66.0 in | Wt 125.0 lb

## 2011-12-21 DIAGNOSIS — I480 Paroxysmal atrial fibrillation: Secondary | ICD-10-CM

## 2011-12-21 DIAGNOSIS — I4891 Unspecified atrial fibrillation: Secondary | ICD-10-CM

## 2011-12-21 DIAGNOSIS — E785 Hyperlipidemia, unspecified: Secondary | ICD-10-CM

## 2011-12-21 DIAGNOSIS — I119 Hypertensive heart disease without heart failure: Secondary | ICD-10-CM

## 2011-12-21 LAB — POCT INR: INR: 2.4

## 2011-12-21 NOTE — Assessment & Plan Note (Signed)
The patient has a history of dyslipidemia.  She is on fenofibrate and this is monitored by Dr. Collins Scotland.

## 2011-12-21 NOTE — Progress Notes (Signed)
Lisa Lucero Date of Birth:  [birth year] [facility] / [facility] [address]   [address] [place], [place]  [address] [phone]           Fax   [phone]  History of Present Illness: This pleasant 76 year old woman is seen back for a scheduled followup office visit.  She has a past history of paroxysmal atrial fibrillation.  She was last hospitalized for atrial fibrillation in March of 2010.  She is on long-term Coumadin.  After she had gone off Coumadin and was maintaining normal sinus rhythm she was hospitalized with an embolic stroke and was placed back on long-term Coumadin.  The Coumadin had to be held in October 2011 when she was admitted [facility] with a GI bleed secondary to erosive duodenitis.  She's had no further problems with her stomach since being placed on long-term proton pump inhibitor.  Current Outpatient Prescriptions  Medication Sig Dispense Refill  . acetaminophen (TYLENOL) 500 MG tablet Take 500 mg by mouth every 6 (six) hours as needed.        . calcitonin, salmon, (MIACALCIN/FORTICAL) 200 UNIT/ACT nasal spray 1 spray by Nasal route daily.        Marland Kitchen diltiazem (DILACOR XR) 180 MG 24 hr capsule Take 180 mg by mouth daily.        . diphenhydrAMINE (BENADRYL) 25 MG tablet Take 25 mg by mouth every 6 (six) hours as needed.        . fenofibrate 54 MG tablet Take 54 mg by mouth daily.        . IRON PO Take by mouth.        Marland Kitchen KLOR-CON M10 10 MEQ tablet TAKE 1 TABLET BY MOUTH TWICE A DAY  60 tablet  5  . levothyroxine (SYNTHROID, LEVOTHROID) 50 MCG tablet Take 50 mcg by mouth daily.        . metoprolol (LOPRESSOR) 50 MG tablet Take 50 mg by mouth 2 (two) times daily.        . pantoprazole (PROTONIX) 40 MG tablet Take 40 mg by mouth daily.       . traMADol (ULTRAM) 50 MG tablet Take 50 mg by mouth daily. Taking two bid      . traZODone (DESYREL) 50 MG tablet Take 50 mg by mouth at bedtime. Taking 1/2 daily per Dr. Collins Scotland       . warfarin (COUMADIN) 5 MG  tablet TAKE 1 TABLET DAILY X2DAYS AND TAKE 1/2 TABLET DAILY FOR 5 DAYS OR AS DIRECTED  90 tablet  3  . fluticasone (FLONASE) 50 MCG/ACT nasal spray Place 50 mcg into the nose as directed.        Allergies  Allergen Reactions  . Lipitor (Atorvastatin Calcium)     myalgias    Patient Active Problem List  Diagnoses  . Atrial fibrillation  . History of embolic stroke  . Hyperthyroidism  . Long-term (current) use of anticoagulants  . Benign hypertensive heart disease without heart failure  . Dyslipidemia    History  Smoking status  . Former Smoker  Smokeless tobacco  . Not on file    History  Alcohol Use: Not on file    Family History  Problem Relation Age of Onset  . Stroke Mother   . Dementia Father     Review of Systems: Constitutional: no fever chills diaphoresis or fatigue or change in weight.  Head and neck: no hearing loss, no epistaxis, no photophobia or visual disturbance. Respiratory:  No cough, shortness of breath or wheezing. Cardiovascular: No chest pain peripheral edema, palpitations. Gastrointestinal: No abdominal distention, no abdominal pain, no change in bowel habits hematochezia or melena. Genitourinary: No dysuria, no frequency, no urgency, no nocturia. Musculoskeletal:No arthralgias, no back pain, no gait disturbance or myalgias. Neurological: No dizziness, no headaches, no numbness, no seizures, no syncope, no weakness, no tremors. Hematologic: No lymphadenopathy, no easy bruising. Psychiatric: No confusion, no hallucinations, no sleep disturbance.    Physical Exam: Filed Vitals:   12/21/11 1121  BP: 140/98  Pulse: 60   the general appearance reveals a well-developed well-nourished woman in no distress.The head and neck exam reveals pupils equal and reactive.  Extraocular movements are full.  There is no scleral icterus.  The mouth and pharynx are normal.  The neck is supple.  The carotids reveal no bruits.  The jugular venous pressure is  normal.  The  thyroid is not enlarged.  There is no lymphadenopathy.  The chest is clear to percussion and auscultation.  There are no rales or rhonchi.  Expansion of the chest is symmetrical.  The precordium is quiet.  The first heart sound is normal.  The second heart sound is physiologically split.  There is no murmur gallop rub or click.  There is no abnormal lift or heave.  The abdomen is soft and nontender.  The bowel sounds are normal.  The liver and spleen are not enlarged.  There are no abdominal masses.  There are no abdominal bruits.  Extremities reveal good pedal pulses.  There is no phlebitis or edema.  There is no cyanosis or clubbing.  Strength is normal and symmetrical in all extremities.  There is no lateralizing weakness.  There are no sensory deficits.  The skin is warm and dry.  There is no rash.     Assessment / Plan: Continue on same medication to needs to get back to regular exercise.  Watch dietary salt especially over the holidays.  Followup with Dr. Collins Scotland for a blood pressure check in early January.  Recheck here in 4 months for office visit and EKG

## 2011-12-21 NOTE — Patient Instructions (Signed)
Increase your walking and watch your salt intake to help lower your blood pressure.  Follow up with Dr Collins Scotland in about 2 weeks to recheck your blood pressure, call the office for an appointment.  Your physician wants you to follow-up in: 4 months You will receive a reminder letter in the mail two months in advance. If you don't receive a letter, please call our office to schedule the follow-up appointment.

## 2011-12-21 NOTE — Assessment & Plan Note (Signed)
The patient has a history of essential hypertension.  Today her blood pressure is higher than usual.  She was not aware that her blood pressure was high and she's had no symptoms from it and in fact has been feeling well recently.  She denies headaches or dizziness.  She has not been exercising as much because she has been busy with the holidays.  Also her diet may have had increased salt.  Will not make any changes in her blood pressure medication at this time since usually her pressure is good but we will ask her to followup with Dr. Collins Scotland after the first of the year regarding her blood pressure.

## 2011-12-21 NOTE — Assessment & Plan Note (Signed)
The patient has not been aware of any palpitations or recurrent atrial fibrillation.  She's had no further TIA symptoms or embolic strokes

## 2012-01-02 DIAGNOSIS — N189 Chronic kidney disease, unspecified: Secondary | ICD-10-CM | POA: Diagnosis not present

## 2012-01-02 DIAGNOSIS — I10 Essential (primary) hypertension: Secondary | ICD-10-CM | POA: Diagnosis not present

## 2012-01-04 DIAGNOSIS — N189 Chronic kidney disease, unspecified: Secondary | ICD-10-CM | POA: Diagnosis not present

## 2012-01-07 ENCOUNTER — Ambulatory Visit
Admission: RE | Admit: 2012-01-07 | Discharge: 2012-01-07 | Disposition: A | Discharge status: Home / Self Care | Payer: Medicare Other | Source: Ambulatory Visit | Attending: Endocrinology | Admitting: Endocrinology

## 2012-01-07 DIAGNOSIS — E049 Nontoxic goiter, unspecified: Secondary | ICD-10-CM

## 2012-01-07 DIAGNOSIS — E042 Nontoxic multinodular goiter: Secondary | ICD-10-CM | POA: Diagnosis not present

## 2012-01-18 ENCOUNTER — Ambulatory Visit (INDEPENDENT_AMBULATORY_CARE_PROVIDER_SITE_OTHER): Payer: Medicare Other | Admitting: *Deleted

## 2012-01-18 DIAGNOSIS — I4891 Unspecified atrial fibrillation: Secondary | ICD-10-CM | POA: Diagnosis not present

## 2012-02-22 ENCOUNTER — Ambulatory Visit (INDEPENDENT_AMBULATORY_CARE_PROVIDER_SITE_OTHER): Payer: Medicare Other | Admitting: *Deleted

## 2012-02-22 DIAGNOSIS — I4891 Unspecified atrial fibrillation: Secondary | ICD-10-CM

## 2012-02-22 LAB — POCT INR: INR: 1.3

## 2012-03-04 DIAGNOSIS — J309 Allergic rhinitis, unspecified: Secondary | ICD-10-CM | POA: Diagnosis not present

## 2012-03-05 DIAGNOSIS — N952 Postmenopausal atrophic vaginitis: Secondary | ICD-10-CM | POA: Diagnosis not present

## 2012-03-08 ENCOUNTER — Inpatient Hospital Stay (HOSPITAL_COMMUNITY)
Admission: EM | Admit: 2012-03-08 | Discharge: 2012-03-13 | DRG: 418 | Disposition: A | Discharge status: Home / Self Care | Payer: Medicare Other | Source: Ambulatory Visit | Attending: Internal Medicine | Admitting: Internal Medicine

## 2012-03-08 ENCOUNTER — Emergency Department (HOSPITAL_COMMUNITY): Payer: Medicare Other

## 2012-03-08 ENCOUNTER — Encounter (HOSPITAL_COMMUNITY): Payer: Self-pay | Admitting: Emergency Medicine

## 2012-03-08 DIAGNOSIS — R1900 Intra-abdominal and pelvic swelling, mass and lump, unspecified site: Secondary | ICD-10-CM | POA: Diagnosis not present

## 2012-03-08 DIAGNOSIS — I10 Essential (primary) hypertension: Secondary | ICD-10-CM | POA: Diagnosis present

## 2012-03-08 DIAGNOSIS — K8010 Calculus of gallbladder with chronic cholecystitis without obstruction: Secondary | ICD-10-CM | POA: Diagnosis not present

## 2012-03-08 DIAGNOSIS — K828 Other specified diseases of gallbladder: Secondary | ICD-10-CM | POA: Diagnosis not present

## 2012-03-08 DIAGNOSIS — K219 Gastro-esophageal reflux disease without esophagitis: Secondary | ICD-10-CM | POA: Diagnosis present

## 2012-03-08 DIAGNOSIS — E871 Hypo-osmolality and hyponatremia: Secondary | ICD-10-CM | POA: Diagnosis present

## 2012-03-08 DIAGNOSIS — C249 Malignant neoplasm of biliary tract, unspecified: Secondary | ICD-10-CM | POA: Diagnosis not present

## 2012-03-08 DIAGNOSIS — K810 Acute cholecystitis: Secondary | ICD-10-CM | POA: Diagnosis not present

## 2012-03-08 DIAGNOSIS — E878 Other disorders of electrolyte and fluid balance, not elsewhere classified: Secondary | ICD-10-CM | POA: Diagnosis present

## 2012-03-08 DIAGNOSIS — Z87891 Personal history of nicotine dependence: Secondary | ICD-10-CM | POA: Diagnosis not present

## 2012-03-08 DIAGNOSIS — J4489 Other specified chronic obstructive pulmonary disease: Secondary | ICD-10-CM | POA: Diagnosis present

## 2012-03-08 DIAGNOSIS — Z7901 Long term (current) use of anticoagulants: Secondary | ICD-10-CM | POA: Diagnosis not present

## 2012-03-08 DIAGNOSIS — E876 Hypokalemia: Secondary | ICD-10-CM | POA: Diagnosis present

## 2012-03-08 DIAGNOSIS — Z8673 Personal history of transient ischemic attack (TIA), and cerebral infarction without residual deficits: Secondary | ICD-10-CM

## 2012-03-08 DIAGNOSIS — J449 Chronic obstructive pulmonary disease, unspecified: Secondary | ICD-10-CM | POA: Diagnosis present

## 2012-03-08 DIAGNOSIS — K8020 Calculus of gallbladder without cholecystitis without obstruction: Secondary | ICD-10-CM | POA: Diagnosis present

## 2012-03-08 DIAGNOSIS — Z79899 Other long term (current) drug therapy: Secondary | ICD-10-CM

## 2012-03-08 DIAGNOSIS — K8000 Calculus of gallbladder with acute cholecystitis without obstruction: Secondary | ICD-10-CM | POA: Diagnosis not present

## 2012-03-08 DIAGNOSIS — D72829 Elevated white blood cell count, unspecified: Secondary | ICD-10-CM | POA: Diagnosis present

## 2012-03-08 DIAGNOSIS — R1012 Left upper quadrant pain: Secondary | ICD-10-CM | POA: Diagnosis not present

## 2012-03-08 DIAGNOSIS — R109 Unspecified abdominal pain: Secondary | ICD-10-CM | POA: Diagnosis not present

## 2012-03-08 DIAGNOSIS — R110 Nausea: Secondary | ICD-10-CM | POA: Diagnosis not present

## 2012-03-08 DIAGNOSIS — I4891 Unspecified atrial fibrillation: Secondary | ICD-10-CM | POA: Diagnosis present

## 2012-03-08 DIAGNOSIS — D7289 Other specified disorders of white blood cells: Secondary | ICD-10-CM | POA: Diagnosis not present

## 2012-03-08 DIAGNOSIS — N3091 Cystitis, unspecified with hematuria: Secondary | ICD-10-CM | POA: Diagnosis present

## 2012-03-08 DIAGNOSIS — R1011 Right upper quadrant pain: Secondary | ICD-10-CM | POA: Diagnosis not present

## 2012-03-08 HISTORY — DX: Essential (primary) hypertension: I10

## 2012-03-08 HISTORY — DX: Cerebral infarction, unspecified: I63.9

## 2012-03-08 HISTORY — DX: Chronic obstructive pulmonary disease, unspecified: J44.9

## 2012-03-08 HISTORY — DX: Gastro-esophageal reflux disease without esophagitis: K21.9

## 2012-03-08 LAB — URINALYSIS, ROUTINE W REFLEX MICROSCOPIC
Bilirubin Urine: NEGATIVE
Glucose, UA: 100 mg/dL — AB
Hgb urine dipstick: NEGATIVE
Ketones, ur: NEGATIVE mg/dL
Protein, ur: NEGATIVE mg/dL
Urobilinogen, UA: 0.2 mg/dL (ref 0.0–1.0)

## 2012-03-08 LAB — COMPREHENSIVE METABOLIC PANEL
ALT: 18 U/L (ref 0–35)
AST: 33 U/L (ref 0–37)
Alkaline Phosphatase: 43 U/L (ref 39–117)
CO2: 31 mEq/L (ref 19–32)
GFR calc Af Amer: 65 mL/min — ABNORMAL LOW (ref >90)
GFR calc non Af Amer: 56 mL/min — ABNORMAL LOW (ref >90)
Glucose, Bld: 136 mg/dL — ABNORMAL HIGH (ref 70–99)
Potassium: 3.4 mEq/L — ABNORMAL LOW (ref 3.5–5.1)
Sodium: 134 mEq/L — ABNORMAL LOW (ref 135–145)

## 2012-03-08 LAB — CBC
HCT: 38.4 % (ref 36.0–46.0)
Hemoglobin: 13.3 g/dL (ref 12.0–15.0)
MCH: 31.4 pg (ref 26.0–34.0)
MCHC: 34.6 g/dL (ref 30.0–36.0)
RDW: 13.8 % (ref 11.5–15.5)

## 2012-03-08 LAB — DIFFERENTIAL
Basophils Absolute: 0 10*3/uL (ref 0.0–0.1)
Basophils Relative: 0 % (ref 0–1)
Eosinophils Absolute: 0.1 10*3/uL (ref 0.0–0.7)
Monocytes Absolute: 0.5 10*3/uL (ref 0.1–1.0)
Monocytes Relative: 4 % (ref 3–12)
Neutro Abs: 11.2 10*3/uL — ABNORMAL HIGH (ref 1.7–7.7)

## 2012-03-08 LAB — URINE MICROSCOPIC-ADD ON

## 2012-03-08 LAB — PROTIME-INR: Prothrombin Time: 22.8 seconds — ABNORMAL HIGH (ref 11.6–15.2)

## 2012-03-08 MED ORDER — ACETAMINOPHEN 650 MG RE SUPP: 650.0000 mg | Freq: Four times a day (QID) | RECTAL | Status: DC | PRN

## 2012-03-08 MED ORDER — SENNA 8.6 MG PO TABS
1.0000 | ORAL_TABLET | Freq: Two times a day (BID) | ORAL | Status: DC
  Administered 2012-03-09 – 2012-03-13 (×8): 8.6 mg via ORAL
  Filled 2012-03-08 (×11): qty 1

## 2012-03-08 MED ORDER — IOHEXOL 300 MG/ML  SOLN
80.0000 mL | Freq: Once | INTRAMUSCULAR | Status: AC | PRN
  Administered 2012-03-08: 80 mL via INTRAVENOUS

## 2012-03-08 MED ORDER — ONDANSETRON HCL 4 MG/2ML IJ SOLN
4.0000 mg | Freq: Four times a day (QID) | INTRAMUSCULAR | Status: DC | PRN
  Administered 2012-03-08: 4 mg via INTRAVENOUS
  Filled 2012-03-08: qty 2

## 2012-03-08 MED ORDER — IOHEXOL 300 MG/ML  SOLN
20.0000 mL | INTRAMUSCULAR | Status: AC
  Administered 2012-03-08: 20 mL via ORAL

## 2012-03-08 MED ORDER — ACETAMINOPHEN 325 MG PO TABS: 650.0000 mg | ORAL_TABLET | Freq: Four times a day (QID) | ORAL | Status: DC | PRN

## 2012-03-08 MED ORDER — ONDANSETRON HCL 4 MG PO TABS: 4.0000 mg | ORAL_TABLET | Freq: Four times a day (QID) | ORAL | Status: DC | PRN

## 2012-03-08 MED ORDER — HYDROMORPHONE HCL PF 1 MG/ML IJ SOLN
1.0000 mg | INTRAMUSCULAR | Status: DC | PRN
Start: 2012-03-08 — End: 2012-03-09
  Administered 2012-03-08: 1 mg via INTRAVENOUS
  Filled 2012-03-08: qty 1

## 2012-03-08 MED ORDER — LABETALOL HCL 5 MG/ML IV SOLN
10.0000 mg | INTRAVENOUS | Status: DC | PRN
Start: 2012-03-08 — End: 2012-03-13
  Administered 2012-03-09 (×2): 10 mg via INTRAVENOUS
  Filled 2012-03-08 (×3): qty 4

## 2012-03-08 MED ORDER — METOPROLOL TARTRATE 50 MG PO TABS
50.0000 mg | ORAL_TABLET | Freq: Two times a day (BID) | ORAL | Status: DC
  Administered 2012-03-08 – 2012-03-13 (×10): 50 mg via ORAL
  Filled 2012-03-08: qty 2
  Filled 2012-03-08 (×10): qty 1

## 2012-03-08 MED ORDER — ONDANSETRON HCL 4 MG/2ML IJ SOLN
4.0000 mg | Freq: Four times a day (QID) | INTRAMUSCULAR | Status: DC | PRN
  Administered 2012-03-11: 4 mg via INTRAVENOUS

## 2012-03-08 MED ORDER — SODIUM CHLORIDE 0.9 % IV SOLN
1.5000 g | Freq: Four times a day (QID) | INTRAVENOUS | Status: DC
  Administered 2012-03-09 – 2012-03-12 (×15): 1.5 g via INTRAVENOUS
  Filled 2012-03-08 (×19): qty 1.5

## 2012-03-08 MED ORDER — SODIUM CHLORIDE 0.9 % IV SOLN
Freq: Once | INTRAVENOUS | Status: AC
  Administered 2012-03-09: 04:00:00 via INTRAVENOUS

## 2012-03-08 MED ORDER — DOCUSATE SODIUM 100 MG PO CAPS
100.0000 mg | ORAL_CAPSULE | Freq: Two times a day (BID) | ORAL | Status: DC
  Administered 2012-03-09 – 2012-03-13 (×8): 100 mg via ORAL
  Filled 2012-03-08 (×7): qty 1

## 2012-03-08 MED ORDER — DILTIAZEM HCL ER 180 MG PO CP24
180.0000 mg | ORAL_CAPSULE | Freq: Every day | ORAL | Status: DC
  Administered 2012-03-09 – 2012-03-13 (×5): 180 mg via ORAL
  Filled 2012-03-08 (×5): qty 1

## 2012-03-08 MED ORDER — MORPHINE SULFATE 2 MG/ML IJ SOLN
2.0000 mg | INTRAMUSCULAR | Status: DC | PRN
  Administered 2012-03-09 – 2012-03-12 (×7): 2 mg via INTRAVENOUS
  Filled 2012-03-08 (×9): qty 1

## 2012-03-08 MED ORDER — SODIUM CHLORIDE 0.9 % IV SOLN
3.0000 g | Freq: Once | INTRAVENOUS | Status: AC
  Administered 2012-03-08: 3 g via INTRAVENOUS
  Filled 2012-03-08: qty 3

## 2012-03-08 NOTE — H&P (Signed)
PCP:   Herb Grays, MD, MD   Chief Complaint:  RUQ pain  HPI: 76yoF with h/o embolic CVA 06/2010, HTN, AFib/Coumadin, COPD, h/o GIB with supratherapeutic INR  presents with RUQ pain and found to have GB stone, concern for GB tumor, and pericholecystic fluid  concerning for hemorrhagic cystitis.   Pt is a moderately bad historian, not seemingly due to dementia or acute illness, but states she  exhausted from being ED all day long. However, she states she was in usual state of health without  any complaint until suddenly in the early morning today she awoke with RUQ and epigastric pain,  for which she sought care. She denies nausea until she started taking PO contrast in the ED. She  denies any fevers, chills, sweats, malaise, and was well until this event. Pt has not eaten all day, so unclear if worse with PO intake.   In the ED, vitals were stable. Labs showed minimal hypoNa 134, hypoK 3.4, hypoCl 93, pre-renal  24/0.97, normal LFT's and lipase. WBC 12.9 with 87% neutros, rest of CBC normal. INR 1.97. UA  negative for infection. Initially had RUQ ultrasound showing "markedly abnormal GB" and this was  followed with CT showing dilated GB with 2.2cm gallstone, filled with medium density sludge vs  tumor, and with pericholecystic fluid tracking inferiorly called as possible hemorrhage, i.e.  hemorrhagic cystitis. These changes were affecting the 2nd part duodenum, with mass effect. There  was biliary and pancreatic ductal dilatation but no clear mass or stone; finally also with  borderline c'megaly and COPD.   Dr. Lindie Spruce in surgery was called from the ED and will see the patient. Pt has been ordered for  Unasyn.   ROS otherwise negative, no cardiopulmonary complaints, no dysuria, no cough.   Past Medical History  Diagnosis Date  . Paroxysmal atrial fibrillation   . History of embolic stroke 06/2010  . Erosive gastritis     with GI bleed thought due to elevated INR and duodenitis  .  Hyperthyroidism   . Peripheral edema   . Long-term (current) use of anticoagulants   . HTN (hypertension)   . COPD (chronic obstructive pulmonary disease)     Past Surgical History  Procedure Date  . Hemorrhoid surgery   . Operative hysteroscopy     Medications:  HOME MEDS: Poorly reconciled with pt. She does recognize most of the names  Prior to Admission medications   Medication Sig Start Date End Date Taking? Authorizing Provider  acetaminophen (TYLENOL) 500 MG tablet Take 500 mg by mouth 2 (two) times daily.    Yes Historical Provider, MD  CALCIUM PO Take 1,000 mg by mouth daily.   Yes Historical Provider, MD  diltiazem (DILACOR XR) 180 MG 24 hr capsule Take 180 mg by mouth daily.     Yes Historical Provider, MD  fenofibrate 54 MG tablet Take 54 mg by mouth daily.     Yes Historical Provider, MD  fluticasone (FLONASE) 50 MCG/ACT nasal spray Place 2 sprays into the nose daily.  10/26/11  Yes Historical Provider, MD  HYDROcodone-acetaminophen (VICODIN ES) 7.5-750 MG per tablet Take 1 tablet by mouth once.   Yes Historical Provider, MD  IRON PO Take 1 tablet by mouth daily.    Yes Historical Provider, MD  levothyroxine (SYNTHROID, LEVOTHROID) 50 MCG tablet Take 50 mcg by mouth daily.     Yes Historical Provider, MD  metoprolol (LOPRESSOR) 50 MG tablet Take 50 mg by mouth 2 (two) times daily.  Yes Historical Provider, MD  Multiple Vitamin (MULITIVITAMIN WITH MINERALS) TABS Take 1 tablet by mouth daily.   Yes Historical Provider, MD  pantoprazole (PROTONIX) 40 MG tablet Take 40 mg by mouth daily.    Yes Historical Provider, MD  potassium chloride (K-DUR) 10 MEQ tablet Take 10 mEq by mouth 2 (two) times daily.   Yes Historical Provider, MD  traMADol (ULTRAM) 50 MG tablet Take 50 mg by mouth 2 (two) times daily.    Yes Historical Provider, MD  traZODone (DESYREL) 50 MG tablet Take 25 mg by mouth at bedtime.    Yes Historical Provider, MD  warfarin (COUMADIN) 5 MG tablet Take 2.5-5 mg  by mouth daily. 2.5mg  Monday-Saturday 5mg  Sunday   Yes Historical Provider, MD    Allergies:  Allergies  Allergen Reactions  . Lipitor (Atorvastatin Calcium)     myalgias    Social History:   reports that she quit smoking about 17 months ago. Her smoking use included Cigarettes. She does not have any smokeless tobacco history on file. She reports that she drinks about .6 ounces of alcohol per week. She reports that she does not use illicit drugs.  She lives at home with her husband.   Family History: Family History  Problem Relation Age of Onset  . Stroke Mother   . Dementia Father   . Hypertension    . Arthritis      Physical Exam: Filed Vitals:   03/08/12 1059 03/08/12 1727 03/08/12 2033  BP: 147/96 159/93 200/118  Pulse: 59 65 78  Temp: 97.7 F (36.5 C) 97 F (36.1 C) 98.1 F (36.7 C)  TempSrc: Oral Oral Oral  Resp: 20 18 16   SpO2: 99% 99% 98%   Blood pressure 200/118, pulse 78, temperature 98.1 F (36.7 C), temperature source Oral, resp. rate 16, SpO2 98.00%. Gen: Elderly appearing F in no distress, appears well, poor historian, well dressed, not toxic or  even ill appearing.  HEENT: Pupils round, reactive, normal appearing, EOMI, sclera clear wihtout icterus. Mouth moist  and normal appearing Lungs: CTAB no w/c/r, good air movement, normal exam Heart: Regular and not tachy, no m/g, normal exam as well Abd: All quadrants except RUQ are benign, no TTP, no facial grimacing, however when RUQ is  palpated, she guards quite a lot and endorses pain, and grimaces. Difficult to assess liver edge  or GB given her guarding. Positive BS's Extrem: Warm, perfusing normally, radials palpable, normal exam. No BLE edema  Neuro: Alert, attentive, CN 2-12 intact, moves around on the bed on her own, normal exam and  grossly non focal.    Labs & Imaging Results for orders placed during the hospital encounter of 03/08/12 (from the past 48 hour(s))  COMPREHENSIVE METABOLIC PANEL      Status: Abnormal   Collection Time   03/08/12 11:59 AM      Component Value Range Comment   Sodium 134 (*) 135 - 145 (mEq/L)    Potassium 3.4 (*) 3.5 - 5.1 (mEq/L)    Chloride 93 (*) 96 - 112 (mEq/L)    CO2 31  19 - 32 (mEq/L)    Glucose, Bld 136 (*) 70 - 99 (mg/dL)    BUN 24 (*) 6 - 23 (mg/dL)    Creatinine, Ser 1.61  0.50 - 1.10 (mg/dL)    Calcium 09.6  8.4 - 10.5 (mg/dL)    Total Protein 7.9  6.0 - 8.3 (g/dL)    Albumin 4.7  3.5 - 5.2 (g/dL)  AST 33  0 - 37 (U/L)    ALT 18  0 - 35 (U/L)    Alkaline Phosphatase 43  39 - 117 (U/L)    Total Bilirubin 0.4  0.3 - 1.2 (mg/dL)    GFR calc non Af Amer 56 (*) >90 (mL/min)    GFR calc Af Amer 65 (*) >90 (mL/min)   CBC     Status: Abnormal   Collection Time   03/08/12 11:59 AM      Component Value Range Comment   WBC 12.9 (*) 4.0 - 10.5 (K/uL)    RBC 4.24  3.87 - 5.11 (MIL/uL)    Hemoglobin 13.3  12.0 - 15.0 (g/dL)    HCT 16.1  09.6 - 04.5 (%)    MCV 90.6  78.0 - 100.0 (fL)    MCH 31.4  26.0 - 34.0 (pg)    MCHC 34.6  30.0 - 36.0 (g/dL)    RDW 40.9  81.1 - 91.4 (%)    Platelets 257  150 - 400 (K/uL)   DIFFERENTIAL     Status: Abnormal   Collection Time   03/08/12 11:59 AM      Component Value Range Comment   Neutrophils Relative 87 (*) 43 - 77 (%)    Neutro Abs 11.2 (*) 1.7 - 7.7 (K/uL)    Lymphocytes Relative 9 (*) 12 - 46 (%)    Lymphs Abs 1.2  0.7 - 4.0 (K/uL)    Monocytes Relative 4  3 - 12 (%)    Monocytes Absolute 0.5  0.1 - 1.0 (K/uL)    Eosinophils Relative 1  0 - 5 (%)    Eosinophils Absolute 0.1  0.0 - 0.7 (K/uL)    Basophils Relative 0  0 - 1 (%)    Basophils Absolute 0.0  0.0 - 0.1 (K/uL)   LIPASE, BLOOD     Status: Normal   Collection Time   03/08/12 11:59 AM      Component Value Range Comment   Lipase 19  11 - 59 (U/L)   PROTIME-INR     Status: Abnormal   Collection Time   03/08/12 11:59 AM      Component Value Range Comment   Prothrombin Time 22.8 (*) 11.6 - 15.2 (seconds)    INR 1.97 (*) 0.00 - 1.49      URINALYSIS, ROUTINE W REFLEX MICROSCOPIC     Status: Abnormal   Collection Time   03/08/12 12:03 PM      Component Value Range Comment   Color, Urine YELLOW  YELLOW     APPearance CLEAR  CLEAR     Specific Gravity, Urine 1.013  1.005 - 1.030     pH 8.0  5.0 - 8.0     Glucose, UA 100 (*) NEGATIVE (mg/dL)    Hgb urine dipstick NEGATIVE  NEGATIVE     Bilirubin Urine NEGATIVE  NEGATIVE     Ketones, ur NEGATIVE  NEGATIVE (mg/dL)    Protein, ur NEGATIVE  NEGATIVE (mg/dL)    Urobilinogen, UA 0.2  0.0 - 1.0 (mg/dL)    Nitrite NEGATIVE  NEGATIVE     Leukocytes, UA TRACE (*) NEGATIVE    URINE MICROSCOPIC-ADD ON     Status: Abnormal   Collection Time   03/08/12 12:03 PM      Component Value Range Comment   Squamous Epithelial / LPF FEW (*) RARE     WBC, UA 0-2  <3 (WBC/hpf)    RBC / HPF 0-2  <3 (  RBC/hpf)    Urine-Other MUCOUS PRESENT      US Abdomen Complete  03/08/2012  *RADIOLOGY REPORT*  Clinical Data:  Right upper quadrant pain.  Elevated white count.  COMPLETE ABDOMINAL ULTRASOUND  Comparison:  None.  Findings:  Gallbladder:  The gallbladder is profoundly abnormal.  It appears enlarged and filled with echogenic material and at least one gallstone.  I looked  at this myself and could not see blood flow within this tissue, though the appearance otherwise is quite worrisome for a gallbladder mass.  The only other possibility would be a gallbladder full of pus.  In any case, I would recommend a CT scan to help [place] understand this unusual appearance.  The patient was not extraordinarily tender in this area surprisingly.  Common bile duct:  Enlarged at 9 mm.  No ductal stone is seen.  Liver:  No focal parenchymal lesion.  Mild intraductal prominence in the left lobe.  IVC:  Normal  Pancreas:  Normal  Spleen:  Normal at 6.2 cm.  Right Kidney:  Normal at 9.6 cm.  Normal echogenicity.  No cyst, mass, stone or hydronephrosis.  Left Kidney:  Normal at 9.7 cm.  Abdominal aorta:  Mildly ectatic with maximal  diameter 2.2 cm.  No ascites  IMPRESSION: Markedly abnormal gallbladder.  The structure is enlarged and filled with echogenic material and at least one gallstone.  The appearance is worrisome for a gallbladder mass, though I cannot demonstrate blood flow within it.  If this is not a mass, it would seem to represent advanced cholecystitis with pus filling the gallbladder.  However, the patient was not very tender, surprisingly.  I would recommend a CT scan to understand this more completely.  Original Report Authenticated By: Thomasenia Sales, M.D.   Ct Abdomen Pelvis W Contrast  03/08/2012  *RADIOLOGY REPORT*  Clinical Data: Right abdominal pain and palpable mass.  CT ABDOMEN AND PELVIS WITH CONTRAST  Technique:  Multidetector CT imaging of the abdomen and pelvis was performed following the standard protocol during bolus administration of intravenous contrast.  Contrast: 80mL OMNIPAQUE IOHEXOL 300 MG/ML IJ SOLN  Comparison: None.  Findings: The gallbladder is markedly abnormal and enlarged containing medium density sludge or soft tissue surrounding a 2.2 cm gallstone.  There is also medium density fluid surrounding the gallbladder and tracking inferiorly from the gallbladder posterior to the transverse colon and partially surrounding the duodenum. There is also medial displacement and flattening of the second portion of the duodenum by these changes.  Also noted is pronounced intrahepatic biliary ductal dilatation and less pronounced extrahepatic biliary ductal dilatation.  No obstructing stone or pancreatic mass is visualized.  Mildly dilated pancreatic duct, measuring 3.3 mm in diameter.  Mild changes of COPD at the lung bases.  The heart is borderline enlarged.  Unremarkable spleen. The adrenal glands, kidneys and urinary bladder are unremarkable.  Surgically absent uterus.  No adnexal masses or enlarged lymph nodes seen.  No gastrointestinal abnormalities.  Mild lumbar spine degenerative changes.  IMPRESSION:   1.  Dilated gallbladder containing a 2.2 cm gallstone and filled with medium density sludge or tumor. 2.  Medium density pericholecystic fluid tracking inferiorly, as described above.  This may represent hemorrhage, potentially associated with hemorrhagic cholecystitis. 3.  The changes in and around the gallbladder are secondarily affecting the second portion of the duodenum, with mass effect on the duodenum. 4.  Biliary and pancreatic ductal dilatation with no visible obstructing stone or mass. 5.  COPD and borderline  cardiomegaly.  These results were called by telephone on 03/08/2012  at  1850 hours to  Dr. Jeraldine Loots, who verbally acknowledged these results.  Original Report Authenticated By: Darrol Angel, M.D.    Impression Present on Admission:  .Gallstone .Leukocytosis .Afib .Hyponatremia .Hypochloremia .Hypokalemia .Atrial fibrillation  76yoF with h/o embolic CVA 06/2010, HTN, AFib/Coumadin, COPD, h/o GIB with supratherapeutic INR  presents with RUQ pain and found to have GB stone, concern for GB tumor, and pericholecystic fluid  concerning for hemorrhagic cystitis.   1. Gallstone, with sludge vs tumor, and inferiorly tracking fluid concering for hemorrhagic  cystitis: Despite WBC 13, pt doesn't actually appear ill and was doing well until today, no  fevers. Will need to have ? GB tumor worked up and surgery already consulted, appreciated. Given  the concern for hemorrhagic cystitis, I think it's safer to hold her Coumadin for a couple days  until the dust settles, despite her known h/o CVA and AFib. I have discussed this with the pt and  she agrees. Furthermore, although she does not appear infected to me, her WBC count is high, so  will give IV Unasyn but low threshold to stop this in a couple days.   - Clear liquids if tolerable but NPO if pain as discussed with pt, will make NPOpMN. Surgery recs  appreciated  - Unasyn for now. Pain control.   2. Leukocytosis: As above. UA is  negative and has no other infectious ROS.   3. AFib: cardiac exam is actually regular at present and not tachycardic, not active issue except  for holding coumadin.  - Continue home metoprolol, diltiazem. Trending daily INR.   4. HypoNa, hypoCl, hypoK: All minimal. Will give some IVF's and PO repletion of K, monitor.   5. Holding all various non essential meds. Continue levothyroxine   Regular bed, [facility] team 9 Presumed full code  Other plans as per orders.  Analisa Sledd 03/08/2012, 8:40 PM  Addendum: Pt now running quite hypertense, will restart her PO metoprolol and diltiazem as she probably has missed the PM doses

## 2012-03-08 NOTE — ED Notes (Signed)
Pt. Stated, Iwoke up with abdominal pain onset 0300, called the Dr. And told to come here

## 2012-03-08 NOTE — ED Notes (Signed)
Patient transported to CT 

## 2012-03-08 NOTE — ED Provider Notes (Signed)
History     CSN: [id number]  Arrival date & time 03/08/12  1052   First MD Initiated Contact with Patient 03/08/12 1137      Chief Complaint  Patient presents with  . Abdominal Pain    HPI The patient presents with abdominal pain.  The pain began approximately 9 hours ago, subacutely.  The pain was most prominently about the epigastrium and right upper quadrant.  Pain was also slightly diffuse.  Pain is sharp, crampy.  Patient notes nausea.  She denies any lightheadedness, dyspnea, chest pain there was some relief with the combination of TUMS, Zantac, Vicodin.  On arrival the patient's pain is decreased. Past Medical History  Diagnosis Date  . Paroxysmal atrial fibrillation   . History of embolic stroke   . Erosive gastritis     with GI bleed  . Hyperthyroidism   . Peripheral edema   . Long-term (current) use of anticoagulants     History reviewed. No pertinent past surgical history.  Family History  Problem Relation Age of Onset  . Stroke Mother   . Dementia Father     History  Substance Use Topics  . Smoking status: Former Games developer  . Smokeless tobacco: Not on file  . Alcohol Use: 0.6 oz/week    1 Glasses of wine per week     occassional    OB History    Grav Para Term Preterm Abortions TAB SAB Ect Mult Living                  Review of Systems  Constitutional:       HPI  HENT:       HPI otherwise negative  Eyes: Negative.   Respiratory:       HPI, otherwise negative  Cardiovascular:       HPI, otherwise nmegative  Gastrointestinal: Negative for vomiting.  Genitourinary:       HPI, otherwise negative  Musculoskeletal:       HPI, otherwise negative  Skin: Negative.   Neurological: Negative for syncope.    Allergies  Lipitor  Home Medications   Current Outpatient Rx  Name Route Sig Dispense Refill  . ACETAMINOPHEN 500 MG PO TABS Oral Take 500 mg by mouth 2 (two) times daily.     Marland Kitchen CALCIUM PO Oral Take 1,000 mg by mouth daily.    Marland Kitchen DILTIAZEM  HCL ER 180 MG PO CP24 Oral Take 180 mg by mouth daily.      . FENOFIBRATE 54 MG PO TABS Oral Take 54 mg by mouth daily.      Marland Kitchen FLUTICASONE PROPIONATE 50 MCG/ACT NA SUSP Nasal Place 2 sprays into the nose daily.     Marland Kitchen HYDROCODONE-ACETAMINOPHEN 7.5-750 MG PO TABS Oral Take 1 tablet by mouth once.    . IRON PO Oral Take 1 tablet by mouth daily.     Marland Kitchen LEVOTHYROXINE SODIUM 50 MCG PO TABS Oral Take 50 mcg by mouth daily.      Marland Kitchen METOPROLOL TARTRATE 50 MG PO TABS Oral Take 50 mg by mouth 2 (two) times daily.      . ADULT MULTIVITAMIN W/MINERALS CH Oral Take 1 tablet by mouth daily.    Marland Kitchen PANTOPRAZOLE SODIUM 40 MG PO TBEC Oral Take 40 mg by mouth daily.     Marland Kitchen POTASSIUM CHLORIDE ER 10 MEQ PO TBCR Oral Take 10 mEq by mouth 2 (two) times daily.    . TRAMADOL HCL 50 MG PO TABS Oral Take 50 mg by  mouth 2 (two) times daily.     . TRAZODONE HCL 50 MG PO TABS Oral Take 25 mg by mouth at bedtime.     . WARFARIN SODIUM 5 MG PO TABS Oral Take 2.5-5 mg by mouth daily. 2.5mg  Monday-Saturday 5mg  Sunday      BP 147/96  Pulse 59  Temp(Src) 97.7 F (36.5 C) (Oral)  Resp 20  SpO2 99%  Physical Exam  Nursing note and vitals reviewed. Constitutional: She is oriented to person, place, and time. She appears well-developed and well-nourished. No distress.  HENT:  Head: Normocephalic and atraumatic.  Eyes: Conjunctivae and EOM are normal.  Cardiovascular: Normal rate and regular rhythm.   Pulmonary/Chest: Effort normal and breath sounds normal. No stridor. No respiratory distress.  Abdominal: Soft. She exhibits no distension. There is no hepatosplenomegaly. There is tenderness in the right upper quadrant, epigastric area, periumbilical area and suprapubic area. There is guarding. There is no rigidity, no rebound and no CVA tenderness.  Musculoskeletal: She exhibits no edema.  Neurological: She is alert and oriented to person, place, and time. No cranial nerve deficit.  Skin: Skin is warm and dry.  Psychiatric: She  has a normal mood and affect.    ED Course  Procedures (including critical care time)   Labs Reviewed  COMPREHENSIVE METABOLIC PANEL  CBC  DIFFERENTIAL  LIPASE, BLOOD  URINALYSIS, ROUTINE W REFLEX MICROSCOPIC   No results found.   No diagnosis found.  I have reviewed the scan and discuss both the CT and the ultrasound with the radiologist.  I have discussed the case with general surgery, as well as the hospitalist service.  MDM  This elderly female with atrial fibrillation, anticoagulated, now presents with new abdominal pain.  On my exam the patient's pain is improved, as she took Vicodin prior to arrival.  Patient does have tenderness both in the epigastrium and a positive Murphy's sign.  The patient's labs are notable for leukocytosis, but no other notable findings consistent with acute hepatobiliary obstruction.  The patient's ultrasound was notable for a 2 cm gallstone and nonspecific gallbladder changes.  A followup CT demonstrated changes concerning for either malignancy or hemorrhagic cholecystitis.  Given these findings, the patient's leukocytosis, she was provided Unasyn.  The patient's INR was actually subtherapeutic, but given the possibility of surgery, this was not supplemented with Lovenox.  I discussed the case with Dr. Lindie Spruce who will be available as a consultant for the patient.  The patient was admitted to the hospital service for further evaluation and management.  Gerhard Munch, MD 03/08/12 [phone]

## 2012-03-08 NOTE — ED Notes (Signed)
Attempted to call report.  RN in contact room. Will call back in 10 minutes.  Dr Kaylyn Layer is aware that pt has taken diltiazem this am.  He stated to have pt take metoprolol and have floor call him in 30 min if pt tp isn't below 170/100 at re-check.

## 2012-03-08 NOTE — ED Notes (Signed)
PT with elevated bp.  Denies pain.  All other vs stable.  Pt has not taken her pm meds.  Dr Kaylyn Layer notified and stated to release her pm bp meds.

## 2012-03-09 LAB — BASIC METABOLIC PANEL
BUN: 15 mg/dL (ref 6–23)
CO2: 25 mEq/L (ref 19–32)
Calcium: 9.6 mg/dL (ref 8.4–10.5)
Chloride: 93 mEq/L — ABNORMAL LOW (ref 96–112)
Creatinine, Ser: 0.77 mg/dL (ref 0.50–1.10)
GFR calc Af Amer: >90 mL/min (ref >90)
GFR calc non Af Amer: 81 mL/min — ABNORMAL LOW (ref >90)
Glucose, Bld: 124 mg/dL — ABNORMAL HIGH (ref 70–99)
Potassium: 3 mEq/L — ABNORMAL LOW (ref 3.5–5.1)
Sodium: 130 mEq/L — ABNORMAL LOW (ref 135–145)

## 2012-03-09 LAB — CBC
HCT: 36.3 % (ref 36.0–46.0)
Hemoglobin: 12.5 g/dL (ref 12.0–15.0)
MCH: 30.6 pg (ref 26.0–34.0)
MCHC: 34.4 g/dL (ref 30.0–36.0)
MCV: 88.8 fL (ref 78.0–100.0)
Platelets: 234 10*3/uL (ref 150–400)
RBC: 4.09 MIL/uL (ref 3.87–5.11)
RDW: 13.6 % (ref 11.5–15.5)
WBC: 9.9 10*3/uL (ref 4.0–10.5)

## 2012-03-09 LAB — GLUCOSE, CAPILLARY: Glucose-Capillary: 119 mg/dL — ABNORMAL HIGH (ref 70–99)

## 2012-03-09 LAB — PROTIME-INR: INR: 2.32 — ABNORMAL HIGH (ref 0.00–1.49)

## 2012-03-09 MED ORDER — POTASSIUM CHLORIDE CRYS ER 20 MEQ PO TBCR
40.0000 meq | EXTENDED_RELEASE_TABLET | Freq: Once | ORAL | Status: AC
  Administered 2012-03-09: 40 meq via ORAL
  Filled 2012-03-09: qty 2

## 2012-03-09 MED ORDER — LEVOTHYROXINE SODIUM 50 MCG PO TABS
50.0000 ug | ORAL_TABLET | Freq: Every day | ORAL | Status: DC
  Administered 2012-03-09 – 2012-03-13 (×5): 50 ug via ORAL
  Filled 2012-03-09 (×5): qty 1

## 2012-03-09 MED ORDER — HYDRALAZINE HCL 20 MG/ML IJ SOLN
20.0000 mg | Freq: Three times a day (TID) | INTRAMUSCULAR | Status: DC | PRN
  Administered 2012-03-09 – 2012-03-12 (×2): 20 mg via INTRAVENOUS
  Filled 2012-03-09: qty 1

## 2012-03-09 MED ORDER — DIPHENHYDRAMINE HCL 50 MG/ML IJ SOLN: 12.5000 mg | Freq: Four times a day (QID) | INTRAMUSCULAR | Status: DC | PRN

## 2012-03-09 MED ORDER — HYDRALAZINE HCL 20 MG/ML IJ SOLN
10.0000 mg | Freq: Four times a day (QID) | INTRAMUSCULAR | Status: DC | PRN
  Filled 2012-03-09: qty 0.5

## 2012-03-09 NOTE — Progress Notes (Signed)
Patient ID: Lisa Lucero, female   DOB: 02/02/1935, 77 y.o.   MRN: [id number]  Assessment/Plan:  Principal Problem:   *Cholelithiasis/Cholecystitis - patient remains afebrile - WBC count is within normal limits - patient was started on Unasyn on admission - as per EPIC note surgery has been notified of patient's admission and consult requested - we will follow up with surgery - continue antibiotics - analgesia provided  Active Problems:   Atrial fibrillation - rate controlled with cardizem and metoprolol - hold coumadin in the setting of possible hemorrhagic cystiti   History of embolic stroke - hold aspirin and coumadin for now   Hemorrhagic cystitis - follow up surgery - hold aspirin and coumadin   Leukocytosis - perhaps secondary to cholecystitis - resolved at present - continue unasyn   Hyponatremia - Possible etiology is dehydration versus SIADH secondary to malignant process (gallbladder) - IV fluids, NS - continue to monitor - no changes in mental status   Hypokalemia - replete - follow up BMP in am   EDUCATION - test results and diagnostic studies were discussed with patientat the bedside - patient has verbalized the understanding - questions were answered at the bedside and contact information was provided for additional questions or concerns   Subjective: No events overnight. Patient denies chest pain, shortness of breath, reports abdominal pain.  Objective:  Vital signs in last 24 hours:  Filed Vitals:   03/09/12 0150 03/09/12 0417 03/09/12 0500 03/09/12 0819  BP: 184/100 180/101 179/90 178/103  Pulse: 69 67 67 75  Temp: 96.7 F (35.9 C) 98.7 F (37.1 C) 98.1 F (36.7 C)   TempSrc: Oral Oral Oral   Resp: 18 18 18    Height:      Weight:      SpO2: 96% 98% 99%     Physical Exam: General: Alert, awake, oriented x3, in no acute distress. HEENT: No bruits, no goiter. Moist mucous membranes, no scleral icterus, no conjunctival  pallor. Heart: Regular rate and rhythm, S1/S2 +, no murmurs, rubs, gallops. Lungs: Clear to auscultation bilaterally. No wheezing, no rhonchi, no rales.  Abdomen: Soft, tender over lower rib margin on the right side, nondistended, positive bowel sounds. Extremities: No clubbing or cyanosis, no pitting edema,  positive pedal pulses. Neuro: Grossly nonfocal.  Lab Results:  Lab 03/09/12 0850 03/08/12 1159  WBC 9.9 12.9*  HGB 12.5 13.3  HCT 36.3 38.4  PLT 234 257  MCV 88.8 90.6  MCH 30.6 31.4  MCHC 34.4 34.6  RDW 13.6 13.8  LYMPHSABS -- 1.2  MONOABS -- 0.5  EOSABS -- 0.1  BASOSABS -- 0.0  BANDABS -- --    Lab 03/09/12 0850 03/08/12 1159  NA 130* 134*  K 3.0* 3.4*  CL 93* 93*  CO2 25 31  GLUCOSE 124* 136*  BUN 15 24*  CREATININE 0.77 0.97  CALCIUM 9.6 10.3  MG -- --    Lab 03/09/12 0850 03/08/12 1159  INR 2.32* 1.97*  PROTIME -- --    Studies/Results: US Abdomen Complete 03/08/2012  *RADIOLOGY REPORT*  Clinical Data:  Right upper quadrant pain.  Elevated white count.  COMPLETE ABDOMINAL ULTRASOUND  Comparison:  None.  Findings:  Gallbladder:  The gallbladder is profoundly abnormal.  It appears enlarged and filled with echogenic material and at least one gallstone.  I looked  at this myself and could not see blood flow within this tissue, though the appearance otherwise is quite worrisome for a gallbladder mass.  The only other possibility would  be a gallbladder full of pus.  In any case, I would recommend a CT scan to help [place] understand this unusual appearance.  The patient was not extraordinarily tender in this area surprisingly.  Common bile duct:  Enlarged at 9 mm.  No ductal stone is seen.  Liver:  No focal parenchymal lesion.  Mild intraductal prominence in the left lobe.  IVC:  Normal  Pancreas:  Normal  Spleen:  Normal at 6.2 cm.  Right Kidney:  Normal at 9.6 cm.  Normal echogenicity.  No cyst, mass, stone or hydronephrosis.  Left Kidney:  Normal at 9.7 cm.  Abdominal  aorta:  Mildly ectatic with maximal diameter 2.2 cm.  No ascites  IMPRESSION: Markedly abnormal gallbladder.  The structure is enlarged and filled with echogenic material and at least one gallstone.  The appearance is worrisome for a gallbladder mass, though I cannot demonstrate blood flow within it.  If this is not a mass, it would seem to represent advanced cholecystitis with pus filling the gallbladder.  However, the patient was not very tender, surprisingly.  I would recommend a CT scan to understand this more completely.  Original Report Authenticated By: Thomasenia Sales, M.D.   Ct Abdomen Pelvis W Contrast 03/08/2012  *RADIOLOGY REPORT*  Clinical Data: Right abdominal pain and palpable mass.  CT ABDOMEN AND PELVIS WITH CONTRAST  Technique:  Multidetector CT imaging of the abdomen and pelvis was performed following the standard protocol during bolus administration of intravenous contrast.  Contrast: 80mL OMNIPAQUE IOHEXOL 300 MG/ML IJ SOLN  Comparison: None.  Findings: The gallbladder is markedly abnormal and enlarged containing medium density sludge or soft tissue surrounding a 2.2 cm gallstone.  There is also medium density fluid surrounding the gallbladder and tracking inferiorly from the gallbladder posterior to the transverse colon and partially surrounding the duodenum. There is also medial displacement and flattening of the second portion of the duodenum by these changes.  Also noted is pronounced intrahepatic biliary ductal dilatation and less pronounced extrahepatic biliary ductal dilatation.  No obstructing stone or pancreatic mass is visualized.  Mildly dilated pancreatic duct, measuring 3.3 mm in diameter.  Mild changes of COPD at the lung bases.  The heart is borderline enlarged.  Unremarkable spleen. The adrenal glands, kidneys and urinary bladder are unremarkable.  Surgically absent uterus.  No adnexal masses or enlarged lymph nodes seen.  No gastrointestinal abnormalities.  Mild lumbar spine  degenerative changes.  IMPRESSION:  1.  Dilated gallbladder containing a 2.2 cm gallstone and filled with medium density sludge or tumor. 2.  Medium density pericholecystic fluid tracking inferiorly, as described above.  This may represent hemorrhage, potentially associated with hemorrhagic cholecystitis. 3.  The changes in and around the gallbladder are secondarily affecting the second portion of the duodenum, with mass effect on the duodenum. 4.  Biliary and pancreatic ductal dilatation with no visible obstructing stone or mass. 5.  COPD and borderline cardiomegaly.  These results were called by telephone on 03/08/2012  at  1850 hours to  Dr. Jeraldine Loots, who verbally acknowledged these results.  Original Report Authenticated By: Darrol Angel, M.D.    Medications: Scheduled Meds:   . sodium chloride   Intravenous Once  . ampicillin-sulbactam (UNASYN) IV  1.5 g Intravenous Q6H  . ampicillin-sulbactam (UNASYN) IV  3 g Intravenous Once  . diltiazem  180 mg Oral Daily  . docusate sodium  100 mg Oral BID  . iohexol  20 mL Oral Q1 Hr x 2  .  levothyroxine  50 mcg Oral Daily  . metoprolol  50 mg Oral BID  . potassium chloride  40 mEq Oral Once  . senna  1 tablet Oral BID     LOS: 1 day   Christabella Alvira 03/09/2012, 1:26 PM  [facility] Pager: [phone]

## 2012-03-10 ENCOUNTER — Encounter (HOSPITAL_COMMUNITY): Payer: Self-pay | Admitting: General Practice

## 2012-03-10 DIAGNOSIS — K8000 Calculus of gallbladder with acute cholecystitis without obstruction: Secondary | ICD-10-CM

## 2012-03-10 DIAGNOSIS — K8010 Calculus of gallbladder with chronic cholecystitis without obstruction: Secondary | ICD-10-CM

## 2012-03-10 LAB — CBC
MCH: 31 pg (ref 26.0–34.0)
MCHC: 34.8 g/dL (ref 30.0–36.0)
MCV: 89.3 fL (ref 78.0–100.0)
Platelets: 210 10*3/uL (ref 150–400)
RBC: 3.93 MIL/uL (ref 3.87–5.11)
RDW: 13.7 % (ref 11.5–15.5)

## 2012-03-10 LAB — BASIC METABOLIC PANEL
CO2: 26 mEq/L (ref 19–32)
Calcium: 9.6 mg/dL (ref 8.4–10.5)
Creatinine, Ser: 0.67 mg/dL (ref 0.50–1.10)
GFR calc non Af Amer: 84 mL/min — ABNORMAL LOW (ref >90)
Sodium: 136 mEq/L (ref 135–145)

## 2012-03-10 LAB — GLUCOSE, CAPILLARY: Glucose-Capillary: 85 mg/dL (ref 70–99)

## 2012-03-10 LAB — PROTIME-INR
INR: 2.28 — ABNORMAL HIGH (ref 0.00–1.49)
Prothrombin Time: 25.5 seconds — ABNORMAL HIGH (ref 11.6–15.2)

## 2012-03-10 MED ORDER — FENOFIBRATE 54 MG PO TABS
54.0000 mg | ORAL_TABLET | Freq: Every day | ORAL | Status: DC
  Administered 2012-03-10 – 2012-03-13 (×4): 54 mg via ORAL
  Filled 2012-03-10 (×4): qty 1

## 2012-03-10 NOTE — Progress Notes (Signed)
Patient ID: Lisa Lucero, female   DOB: June 21, 1935, 77 y.o.   MRN: [id number]  Assessment/Plan:   Principal Problem:   *Cholelithiasis/Cholecystitis  - patient remains afebrile  - WBC count is within normal limits  - patient was started on Unasyn on admission  - surgery consult appreciated - continue antibiotics  - analgesia provided  - keep NPO  Active Problems:   Atrial fibrillation  - rate controlled with cardizem and metoprolol  - hold coumadin in the setting of possible hemorrhagic cystitis  History of embolic stroke  - hold aspirin and coumadin for now   Hemorrhagic cystitis  - follow up surgery  - hold aspirin and coumadin   Leukocytosis  - perhaps secondary to cholecystitis  - resolved at present  - continue unasyn   Hyponatremia  - Possible etiology is dehydration versus SIADH secondary to malignant process (gallbladder)  - IV fluids, NS  - resolved   Hypokalemia  - repleted; resolved - follow up BMP in am   EDUCATION  - test results and diagnostic studies were discussed with patientat the bedside  - patient has verbalized the understanding  - questions were answered at the bedside and contact information was provided for additional questions or concerns   Subjective: No events overnight.   Objective:  Vital signs in last 24 hours:   03/10/12 0456 03/10/12 1316  BP: 169/86 165/95  Pulse: 71 75  Temp: 98.6 F (37 C) 97.1 F (36.2 C)  TempSrc: Oral Oral  Resp: 16 18  SpO2: 97% 98%    Intake/Output from previous day:   Intake/Output Summary (Last 24 hours) at 03/10/12 1735 Last data filed at 03/10/12 0600  Gross per 24 hour  Intake    640 ml  Output      0 ml  Net    640 ml    Physical Exam: General:  no acute distress. HEENT: No bruits, no goiter. Moist mucous membranes, no scleral icterus, no conjunctival pallor. Heart: Regular rate and rhythm, S1/S2 +, no murmurs, rubs, gallops. Lungs: Clear to auscultation bilaterally. No  wheezing, no rhonchi, no rales.  Abdomen: tender without rebound or guarding Extremities: No clubbing or cyanosis, no pitting edema,  positive pedal pulses. Neuro: Grossly nonfocal.  Lab Results:   Lab 03/10/12 1029 03/09/12 0850 03/08/12 1159  WBC 7.3 9.9 12.9*  HGB 12.2 12.5 13.3  HCT 35.1* 36.3 38.4  PLT 210 234 257  MCV 89.3 88.8 90.6    Lab 03/10/12 1029 03/09/12 0850 03/08/12 1159  NA 136 130* 134*  K 3.5 3.0* 3.4*  CL 101 93* 93*  CO2 26 25 31   GLUCOSE 119* 124* 136*  BUN 12 15 24*  CREATININE 0.67 0.77 0.97  CALCIUM 9.6 9.6 10.3  MG -- -- --    Lab 03/10/12 0542 03/09/12 0850 03/08/12 1159  INR 2.28* 2.32* 1.97*   Studies/Results: Ct Abdomen Pelvis W Contrast  03/08/2012  *RADIOLOGY REPORT*  Clinical Data: Right abdominal pain and palpable mass.  CT ABDOMEN AND PELVIS WITH CONTRAST  Technique:  Multidetector CT imaging of the abdomen and pelvis was performed following the standard protocol during bolus administration of intravenous contrast.  Contrast: 80mL OMNIPAQUE IOHEXOL 300 MG/ML IJ SOLN  Comparison: None.  Findings: The gallbladder is markedly abnormal and enlarged containing medium density sludge or soft tissue surrounding a 2.2 cm gallstone.  There is also medium density fluid surrounding the gallbladder and tracking inferiorly from the gallbladder posterior to the transverse colon and partially  surrounding the duodenum. There is also medial displacement and flattening of the second portion of the duodenum by these changes.  Also noted is pronounced intrahepatic biliary ductal dilatation and less pronounced extrahepatic biliary ductal dilatation.  No obstructing stone or pancreatic mass is visualized.  Mildly dilated pancreatic duct, measuring 3.3 mm in diameter.  Mild changes of COPD at the lung bases.  The heart is borderline enlarged.  Unremarkable spleen. The adrenal glands, kidneys and urinary bladder are unremarkable.  Surgically absent uterus.  No adnexal masses  or enlarged lymph nodes seen.  No gastrointestinal abnormalities.  Mild lumbar spine degenerative changes.  IMPRESSION:  1.  Dilated gallbladder containing a 2.2 cm gallstone and filled with medium density sludge or tumor. 2.  Medium density pericholecystic fluid tracking inferiorly, as described above.  This may represent hemorrhage, potentially associated with hemorrhagic cholecystitis. 3.  The changes in and around the gallbladder are secondarily affecting the second portion of the duodenum, with mass effect on the duodenum. 4.  Biliary and pancreatic ductal dilatation with no visible obstructing stone or mass. 5.  COPD and borderline cardiomegaly.  These results were called by telephone on 03/08/2012  at  1850 hours to  Dr. Jeraldine Loots, who verbally acknowledged these results.  Original Report Authenticated By: Darrol Angel, M.D.    Medications: Scheduled Meds:   . ampicillin-sulbactam (UNASYN) IV  1.5 g Intravenous Q6H  . diltiazem  180 mg Oral Daily  . docusate sodium  100 mg Oral BID  . fenofibrate  54 mg Oral Daily  . levothyroxine  50 mcg Oral Daily  . metoprolol  50 mg Oral BID  . senna  1 tablet Oral BID     LOS: 2 days   Keylor Rands 03/10/2012, 5:35 PM  [facility] Pager: [phone]

## 2012-03-10 NOTE — Progress Notes (Addendum)
Speech Language/Pathology SLP Cancellation Note  Evaluation cancelled today due to medical issues with patient which prohibited therapy. Per MD notes, "Suspect hemorrhagic cholecystitis and will need to keep NPO." Discussed with RN who confirmed that patient needs to remain NPO at this time. Will defer swallow evaluation and f/u 3/12.   MD, please clarify if swallow evaluation needed at this time. Thank you.   Ferdinand Lango MA, CCC-SLP [phone]    Lisa Lucero 03/10/2012, 11:20 AM

## 2012-03-10 NOTE — Consult Note (Signed)
Pt seen and examined.  Chart reviewed.  Hemorrhagic cholecystitis.  Will need cholecystectomy once INR lower.  Wll check on tomorrow to see if she is able to go.

## 2012-03-10 NOTE — Consult Note (Signed)
Reason for Consult: RUQ pain Consulting Surgeon: Cornett Referring Physician: Elisabeth Pigeon   HPI: Lisa Lucero is an 77 y.o. female with a history of medical issues including Afib on chronic Coumadin. She had been doing well until Saturday morning when she developed some RUQ and epigastric abd pain. She took some OTC medicine for indigestion with no improvement. (Had a prior hx of UGIB in 12/11 but (-) EGD, treated for gastritis with mild bleed secondary to anticoagulation.) She presented to ER and her workup was abnormal with [place] and CT findings of the gallbladder. Due to her medical issues, she was admitted by medicine service and surgery consult requested. She is feeling better this am. Initial diet rec of NPO was switched to regular diet for some reason. The pt states she could not eat this am due to some pain and nausea.  Past Medical History:  Past Medical History  Diagnosis Date  . Paroxysmal atrial fibrillation   . History of embolic stroke 06/2010  . Erosive gastritis     with GI bleed thought due to elevated INR and duodenitis  . Hyperthyroidism   . Peripheral edema   . Long-term (current) use of anticoagulants   . HTN (hypertension)   . COPD (chronic obstructive pulmonary disease)     Surgical History:  Past Surgical History  Procedure Date  . Hemorrhoid surgery   . Operative hysteroscopy     Family History:  Family History  Problem Relation Age of Onset  . Stroke Mother   . Dementia Father   . Hypertension    . Arthritis      Social History:  reports that she quit smoking about 17 months ago. Her smoking use included Cigarettes. She does not have any smokeless tobacco history on file. She reports that she drinks about .6 ounces of alcohol per week. She reports that she does not use illicit drugs.  Allergies:  Allergies  Allergen Reactions  . Lipitor (Atorvastatin Calcium)     myalgias    Medications:  Prior to Admission:  Prescriptions prior to admission    Medication Sig Dispense Refill  . acetaminophen (TYLENOL) 500 MG tablet Take 500 mg by mouth 2 (two) times daily.       Marland Kitchen CALCIUM PO Take 1,000 mg by mouth daily.      Marland Kitchen diltiazem (DILACOR XR) 180 MG 24 hr capsule Take 180 mg by mouth daily.        . fenofibrate 54 MG tablet Take 54 mg by mouth daily.        . fluticasone (FLONASE) 50 MCG/ACT nasal spray Place 2 sprays into the nose daily.       Marland Kitchen HYDROcodone-acetaminophen (VICODIN ES) 7.5-750 MG per tablet Take 1 tablet by mouth once.      . IRON PO Take 1 tablet by mouth daily.       Marland Kitchen levothyroxine (SYNTHROID, LEVOTHROID) 50 MCG tablet Take 50 mcg by mouth daily.        . metoprolol (LOPRESSOR) 50 MG tablet Take 50 mg by mouth 2 (two) times daily.        . Multiple Vitamin (MULITIVITAMIN WITH MINERALS) TABS Take 1 tablet by mouth daily.      . pantoprazole (PROTONIX) 40 MG tablet Take 40 mg by mouth daily.       . potassium chloride (K-DUR) 10 MEQ tablet Take 10 mEq by mouth 2 (two) times daily.      . traMADol (ULTRAM) 50 MG tablet Take 50 mg  by mouth 2 (two) times daily.       . traZODone (DESYREL) 50 MG tablet Take 25 mg by mouth at bedtime.       Marland Kitchen warfarin (COUMADIN) 5 MG tablet Take 2.5-5 mg by mouth daily. 2.5mg  Monday-Saturday 5mg  Sunday        ROS: See HPI for pertinent findings, otherwise complete 10 system review negative.  Physical Exam: Blood pressure 169/86, pulse 71, temperature 98.6 F (37 C), temperature source Oral, resp. rate 16, height 5\' 6"  (1.676 m), weight 55.838 kg (123 lb 1.6 oz), SpO2 97.00%.  General Appearance:  Alert, cooperative, no distress, appears stated age  Head:  Normocephalic, without obvious abnormality, atraumatic  ENT: Unremarkable  Neck: Supple, symmetrical, trachea midline, no adenopathy, thyroid: not enlarged, symmetric, no tenderness/mass/nodules  Lungs:   Clear to auscultation bilaterally, no w/r/r, respirations unlabored without use of accessory muscles.  Heart:  Regular rate and  rhythm, S1, S2 normal, no murmur, rub or gallop. Carotids 2+ without bruit.  Abdomen:   Soft, low midline scar c/w prior partial Hysterectomy. She is tender RUQ and epigastric region.  Bowel sounds active all four quadrants,  no masses, no organomegaly.  Rectal:  Deferred.  Extremities: Extremities normal, atraumatic, no cyanosis or edema  Skin: Skin color, texture, turgor normal, no rashes or lesions  Neurologic: Normal affect, no gross deficits.     Labs: CBC  Basename 03/09/12 0850 03/08/12 1159  WBC 9.9 12.9*  HGB 12.5 13.3  HCT 36.3 38.4  PLT 234 257   MET  Basename 03/09/12 0850 03/08/12 1159  NA 130* 134*  K 3.0* 3.4*  CL 93* 93*  CO2 25 31  GLUCOSE 124* 136*  BUN 15 24*  CREATININE 0.77 0.97  CALCIUM 9.6 10.3    Basename 03/08/12 1159  PROT 7.9  ALBUMIN 4.7  AST 33  ALT 18  ALKPHOS 43  BILITOT 0.4  BILIDIR --  IBILI --  LIPASE 19   PT/INR  Basename 03/10/12 0542 03/09/12 0850  LABPROT 25.5* 25.9*  INR 2.28* 2.32*   ABG No results found for this basename: PHART:2,PCO2:2,PO2:2,HCO3:2 in the last 72 hours    US Abdomen Complete  03/08/2012  *RADIOLOGY REPORT*  Clinical Data:  Right upper quadrant pain.  Elevated white count.  COMPLETE ABDOMINAL ULTRASOUND  Comparison:  None.  Findings:  Gallbladder:  The gallbladder is profoundly abnormal.  It appears enlarged and filled with echogenic material and at least one gallstone.  I looked  at this myself and could not see blood flow within this tissue, though the appearance otherwise is quite worrisome for a gallbladder mass.  The only other possibility would be a gallbladder full of pus.  In any case, I would recommend a CT scan to help [place] understand this unusual appearance.  The patient was not extraordinarily tender in this area surprisingly.  Common bile duct:  Enlarged at 9 mm.  No ductal stone is seen.  Liver:  No focal parenchymal lesion.  Mild intraductal prominence in the left lobe.  IVC:  Normal  Pancreas:   Normal  Spleen:  Normal at 6.2 cm.  Right Kidney:  Normal at 9.6 cm.  Normal echogenicity.  No cyst, mass, stone or hydronephrosis.  Left Kidney:  Normal at 9.7 cm.  Abdominal aorta:  Mildly ectatic with maximal diameter 2.2 cm.  No ascites  IMPRESSION: Markedly abnormal gallbladder.  The structure is enlarged and filled with echogenic material and at least one gallstone.  The appearance is  worrisome for a gallbladder mass, though I cannot demonstrate blood flow within it.  If this is not a mass, it would seem to represent advanced cholecystitis with pus filling the gallbladder.  However, the patient was not very tender, surprisingly.  I would recommend a CT scan to understand this more completely.  Original Report Authenticated By: Thomasenia Sales, M.D.   Ct Abdomen Pelvis W Contrast  03/08/2012  *RADIOLOGY REPORT*  Clinical Data: Right abdominal pain and palpable mass.  CT ABDOMEN AND PELVIS WITH CONTRAST  Technique:  Multidetector CT imaging of the abdomen and pelvis was performed following the standard protocol during bolus administration of intravenous contrast.  Contrast: 80mL OMNIPAQUE IOHEXOL 300 MG/ML IJ SOLN  Comparison: None.  Findings: The gallbladder is markedly abnormal and enlarged containing medium density sludge or soft tissue surrounding a 2.2 cm gallstone.  There is also medium density fluid surrounding the gallbladder and tracking inferiorly from the gallbladder posterior to the transverse colon and partially surrounding the duodenum. There is also medial displacement and flattening of the second portion of the duodenum by these changes.  Also noted is pronounced intrahepatic biliary ductal dilatation and less pronounced extrahepatic biliary ductal dilatation.  No obstructing stone or pancreatic mass is visualized.  Mildly dilated pancreatic duct, measuring 3.3 mm in diameter.  Mild changes of COPD at the lung bases.  The heart is borderline enlarged.  Unremarkable spleen. The adrenal glands,  kidneys and urinary bladder are unremarkable.  Surgically absent uterus.  No adnexal masses or enlarged lymph nodes seen.  No gastrointestinal abnormalities.  Mild lumbar spine degenerative changes.  IMPRESSION:  1.  Dilated gallbladder containing a 2.2 cm gallstone and filled with medium density sludge or tumor. 2.  Medium density pericholecystic fluid tracking inferiorly, as described above.  This may represent hemorrhage, potentially associated with hemorrhagic cholecystitis. 3.  The changes in and around the gallbladder are secondarily affecting the second portion of the duodenum, with mass effect on the duodenum. 4.  Biliary and pancreatic ductal dilatation with no visible obstructing stone or mass. 5.  COPD and borderline cardiomegaly.  These results were called by telephone on 03/08/2012  at  1850 hours to  Dr. Jeraldine Loots, who verbally acknowledged these results.  Original Report Authenticated By: Darrol Angel, M.D.    Assessment/Plan: Principal Problem: RUQ pain secondary to biliary process, possible hemorrhagic cholecystitis and gallstone. Mass effect on duodenum Atrial fibrillation-Coumadin on hold History of embolic stroke Leukocytosis-normalized LFTs normal on admission  Will d/w MD and review all imaging with radiologist. Suspect hemorrhagic cholecystitis and will need to keep NPO and hold Coumadin, may need to actively reverse, though her Hgb is stable and she appears clinically better.   Marianna Fuss PA-C 03/10/2012, 9:35 AM

## 2012-03-11 ENCOUNTER — Inpatient Hospital Stay (HOSPITAL_COMMUNITY): Payer: Medicare Other | Admitting: Anesthesiology

## 2012-03-11 ENCOUNTER — Encounter (HOSPITAL_COMMUNITY): Payer: Self-pay | Admitting: Anesthesiology

## 2012-03-11 ENCOUNTER — Encounter (HOSPITAL_COMMUNITY)
Admission: EM | Disposition: A | Discharge status: Home / Self Care | Payer: Self-pay | Source: Ambulatory Visit | Attending: Internal Medicine

## 2012-03-11 ENCOUNTER — Inpatient Hospital Stay (HOSPITAL_COMMUNITY): Payer: Medicare Other

## 2012-03-11 ENCOUNTER — Encounter (HOSPITAL_COMMUNITY): Payer: Self-pay | Admitting: Surgery

## 2012-03-11 DIAGNOSIS — K8010 Calculus of gallbladder with chronic cholecystitis without obstruction: Secondary | ICD-10-CM | POA: Diagnosis not present

## 2012-03-11 DIAGNOSIS — K8000 Calculus of gallbladder with acute cholecystitis without obstruction: Secondary | ICD-10-CM | POA: Diagnosis not present

## 2012-03-11 HISTORY — PX: CHOLECYSTECTOMY: SHX55

## 2012-03-11 LAB — COMPREHENSIVE METABOLIC PANEL
ALT: 16 U/L (ref 0–35)
AST: 25 U/L (ref 0–37)
Albumin: 3.2 g/dL — ABNORMAL LOW (ref 3.5–5.2)
Alkaline Phosphatase: 34 U/L — ABNORMAL LOW (ref 39–117)
CO2: 23 mEq/L (ref 19–32)
Chloride: 102 mEq/L (ref 96–112)
Creatinine, Ser: 0.63 mg/dL (ref 0.50–1.10)
GFR calc non Af Amer: 86 mL/min — ABNORMAL LOW (ref >90)
Potassium: 2.8 mEq/L — ABNORMAL LOW (ref 3.5–5.1)
Total Bilirubin: 0.6 mg/dL (ref 0.3–1.2)

## 2012-03-11 LAB — CBC
MCV: 88.8 fL (ref 78.0–100.0)
Platelets: 176 10*3/uL (ref 150–400)
RBC: 3.49 MIL/uL — ABNORMAL LOW (ref 3.87–5.11)
WBC: 6.2 10*3/uL (ref 4.0–10.5)

## 2012-03-11 LAB — PROTIME-INR: INR: 1.97 — ABNORMAL HIGH (ref 0.00–1.49)

## 2012-03-11 SURGERY — LAPAROSCOPIC CHOLECYSTECTOMY WITH INTRAOPERATIVE CHOLANGIOGRAM
Anesthesia: General | Site: Abdomen | Wound class: Contaminated

## 2012-03-11 MED ORDER — OXYCODONE-ACETAMINOPHEN 5-325 MG PO TABS
2.0000 | ORAL_TABLET | ORAL | Status: DC | PRN
  Administered 2012-03-11: 2 via ORAL
  Administered 2012-03-12: 1 via ORAL
  Administered 2012-03-12: 2 via ORAL
  Administered 2012-03-12: 1 via ORAL
  Administered 2012-03-12: 2 via ORAL
  Administered 2012-03-13 (×2): 1 via ORAL
  Filled 2012-03-11: qty 1
  Filled 2012-03-11: qty 2
  Filled 2012-03-11: qty 1
  Filled 2012-03-11: qty 2
  Filled 2012-03-11: qty 1
  Filled 2012-03-11: qty 2
  Filled 2012-03-11: qty 1

## 2012-03-11 MED ORDER — HYDROMORPHONE HCL PF 1 MG/ML IJ SOLN
0.2500 mg | INTRAMUSCULAR | Status: DC | PRN
  Administered 2012-03-11 (×2): 0.5 mg via INTRAVENOUS
  Administered 2012-03-11: 0.25 mg via INTRAVENOUS
  Administered 2012-03-11: 0.5 mg via INTRAVENOUS
  Administered 2012-03-11: 0.25 mg via INTRAVENOUS

## 2012-03-11 MED ORDER — HEPARIN SODIUM (PORCINE) 5000 UNIT/ML IJ SOLN
5000.0000 [IU] | Freq: Three times a day (TID) | INTRAMUSCULAR | Status: DC
  Administered 2012-03-12 – 2012-03-13 (×2): 5000 [IU] via SUBCUTANEOUS
  Filled 2012-03-11 (×5): qty 1

## 2012-03-11 MED ORDER — MIDAZOLAM HCL 5 MG/5ML IJ SOLN
INTRAMUSCULAR | Status: DC | PRN
  Administered 2012-03-11: 2 mg via INTRAVENOUS

## 2012-03-11 MED ORDER — GLYCOPYRROLATE 0.2 MG/ML IJ SOLN
INTRAMUSCULAR | Status: DC | PRN
  Administered 2012-03-11: .4 mg via INTRAVENOUS

## 2012-03-11 MED ORDER — ROCURONIUM BROMIDE 100 MG/10ML IV SOLN
INTRAVENOUS | Status: DC | PRN
  Administered 2012-03-11: 40 mg via INTRAVENOUS

## 2012-03-11 MED ORDER — BUPIVACAINE-EPINEPHRINE 0.25% -1:200000 IJ SOLN
INTRAMUSCULAR | Status: DC | PRN
  Administered 2012-03-11: 10 mL

## 2012-03-11 MED ORDER — NEOSTIGMINE METHYLSULFATE 1 MG/ML IJ SOLN
INTRAMUSCULAR | Status: DC | PRN
  Administered 2012-03-11: 3 mg via INTRAVENOUS

## 2012-03-11 MED ORDER — FENTANYL CITRATE 0.05 MG/ML IJ SOLN
INTRAMUSCULAR | Status: DC | PRN
  Administered 2012-03-11 (×3): 50 ug via INTRAVENOUS
  Administered 2012-03-11: 100 ug via INTRAVENOUS

## 2012-03-11 MED ORDER — HYDROMORPHONE HCL PF 1 MG/ML IJ SOLN
INTRAMUSCULAR | Status: AC
  Administered 2012-03-11: 0.5 mg via INTRAVENOUS
  Filled 2012-03-11: qty 1

## 2012-03-11 MED ORDER — LABETALOL HCL 5 MG/ML IV SOLN
INTRAVENOUS | Status: DC | PRN
  Administered 2012-03-11 (×3): 2.5 mg via INTRAVENOUS
  Administered 2012-03-11 (×2): 5 mg via INTRAVENOUS
  Administered 2012-03-11: 2.5 mg via INTRAVENOUS

## 2012-03-11 MED ORDER — SODIUM CHLORIDE 0.9 % IR SOLN
Status: DC | PRN
  Administered 2012-03-11 (×2): 1000 mL

## 2012-03-11 MED ORDER — LACTATED RINGERS IV SOLN
INTRAVENOUS | Status: DC | PRN
  Administered 2012-03-11: 15:00:00 via INTRAVENOUS

## 2012-03-11 MED ORDER — PROPOFOL 10 MG/ML IV EMUL
INTRAVENOUS | Status: DC | PRN
  Administered 2012-03-11: 140 mg via INTRAVENOUS

## 2012-03-11 MED ORDER — IOHEXOL 300 MG/ML  SOLN
INTRAMUSCULAR | Status: DC | PRN
  Administered 2012-03-11: 20 mL

## 2012-03-11 MED ORDER — ONDANSETRON HCL 4 MG/2ML IJ SOLN: 4.0000 mg | Freq: Four times a day (QID) | INTRAMUSCULAR | Status: DC | PRN

## 2012-03-11 MED ORDER — POTASSIUM CHLORIDE 10 MEQ/100ML IV SOLN
10.0000 meq | INTRAVENOUS | Status: AC
  Administered 2012-03-11 (×6): 10 meq via INTRAVENOUS
  Filled 2012-03-11 (×6): qty 100

## 2012-03-11 MED ORDER — 0.9 % SODIUM CHLORIDE (POUR BTL) OPTIME
TOPICAL | Status: DC | PRN
  Administered 2012-03-11: 2000 mL

## 2012-03-11 MED ORDER — HEMOSTATIC AGENTS (NO CHARGE) OPTIME
TOPICAL | Status: DC | PRN
  Administered 2012-03-11: 1 via TOPICAL

## 2012-03-11 MED ORDER — ONDANSETRON HCL 4 MG/2ML IJ SOLN
INTRAMUSCULAR | Status: DC | PRN
  Administered 2012-03-11: 4 mg via INTRAVENOUS

## 2012-03-11 SURGICAL SUPPLY — 53 items
ADH SKN CLS APL DERMABOND .7 (GAUZE/BANDAGES/DRESSINGS) ×1
APPLIER CLIP ROT 10 11.4 M/L (STAPLE) ×2
APR CLP MED LRG 11.4X10 (STAPLE) ×1
BAG SPEC RTRVL LRG 6X4 10 (ENDOMECHANICALS) ×1
BLADE SURG ROTATE 9660 (MISCELLANEOUS) IMPLANT
CANISTER SUCTION 2500CC (MISCELLANEOUS) ×2 IMPLANT
CHLORAPREP W/TINT 26ML (MISCELLANEOUS) ×2 IMPLANT
CLIP APPLIE ROT 10 11.4 M/L (STAPLE) ×1 IMPLANT
CLOTH BEACON ORANGE TIMEOUT ST (SAFETY) ×2 IMPLANT
COVER MAYO STAND STRL (DRAPES) ×2 IMPLANT
COVER SURGICAL LIGHT HANDLE (MISCELLANEOUS) ×2 IMPLANT
DECANTER SPIKE VIAL GLASS SM (MISCELLANEOUS) ×2 IMPLANT
DERMABOND ADVANCED (GAUZE/BANDAGES/DRESSINGS) ×1
DERMABOND ADVANCED .7 DNX12 (GAUZE/BANDAGES/DRESSINGS) ×1 IMPLANT
DISSECTOR BLUNT TIP ENDO 5MM (MISCELLANEOUS) ×1 IMPLANT
DRAIN CHANNEL 19F RND (DRAIN) ×1 IMPLANT
DRAPE C-ARM 42X72 X-RAY (DRAPES) ×2 IMPLANT
DRAPE UTILITY 15X26 W/TAPE STR (DRAPE) ×4 IMPLANT
DRAPE WARM FLUID 44X44 (DRAPE) ×2 IMPLANT
ELECT REM PT RETURN 9FT ADLT (ELECTROSURGICAL) ×2
ELECTRODE REM PT RTRN 9FT ADLT (ELECTROSURGICAL) ×1 IMPLANT
EVACUATOR SILICONE 100CC (DRAIN) ×1 IMPLANT
GAUZE SPONGE 2X2 8PLY STRL LF (GAUZE/BANDAGES/DRESSINGS) IMPLANT
GLOVE BIO SURGEON STRL SZ8 (GLOVE) ×3 IMPLANT
GLOVE BIOGEL PI IND STRL 6.5 (GLOVE) IMPLANT
GLOVE BIOGEL PI IND STRL 7.0 (GLOVE) IMPLANT
GLOVE BIOGEL PI IND STRL 8 (GLOVE) ×1 IMPLANT
GLOVE BIOGEL PI INDICATOR 6.5 (GLOVE) ×1
GLOVE BIOGEL PI INDICATOR 7.0 (GLOVE) ×1
GLOVE BIOGEL PI INDICATOR 8 (GLOVE) ×2
GLOVE ECLIPSE 6.5 STRL STRAW (GLOVE) ×3 IMPLANT
GLOVE SURG SS PI 6.5 STRL IVOR (GLOVE) ×1 IMPLANT
GOWN STRL NON-REIN LRG LVL3 (GOWN DISPOSABLE) ×8 IMPLANT
HEMOSTAT SNOW SURGICEL 2X4 (HEMOSTASIS) ×1 IMPLANT
KIT BASIN OR (CUSTOM PROCEDURE TRAY) ×2 IMPLANT
KIT ROOM TURNOVER OR (KITS) ×2 IMPLANT
NS IRRIG 1000ML POUR BTL (IV SOLUTION) ×2 IMPLANT
PAD ARMBOARD 7.5X6 YLW CONV (MISCELLANEOUS) ×2 IMPLANT
POUCH SPECIMEN RETRIEVAL 10MM (ENDOMECHANICALS) ×2 IMPLANT
SCISSORS LAP 5X35 DISP (ENDOMECHANICALS) IMPLANT
SET CHOLANGIOGRAPH 5 50 .035 (SET/KITS/TRAYS/PACK) ×2 IMPLANT
SET IRRIG TUBING LAPAROSCOPIC (IRRIGATION / IRRIGATOR) ×2 IMPLANT
SLEEVE ENDOPATH XCEL 5M (ENDOMECHANICALS) ×2 IMPLANT
SPECIMEN JAR SMALL (MISCELLANEOUS) ×2 IMPLANT
SPONGE GAUZE 2X2 STER 10/PKG (GAUZE/BANDAGES/DRESSINGS) ×1
SUT ETHILON 2 0 FS 18 (SUTURE) ×1 IMPLANT
SUT MNCRL AB 4-0 PS2 18 (SUTURE) ×2 IMPLANT
TOWEL OR 17X24 6PK STRL BLUE (TOWEL DISPOSABLE) ×2 IMPLANT
TOWEL OR 17X26 10 PK STRL BLUE (TOWEL DISPOSABLE) ×2 IMPLANT
TRAY LAPAROSCOPIC (CUSTOM PROCEDURE TRAY) ×2 IMPLANT
TROCAR XCEL BLUNT TIP 100MML (ENDOMECHANICALS) ×2 IMPLANT
TROCAR XCEL NON-BLD 11X100MML (ENDOMECHANICALS) ×2 IMPLANT
TROCAR XCEL NON-BLD 5MMX100MML (ENDOMECHANICALS) ×2 IMPLANT

## 2012-03-11 NOTE — Progress Notes (Signed)
Patient ID: Lisa Lucero, female   DOB: 1935/06/26, 77 y.o.   MRN: [id number]  Assessment/Plan:   Principal Problem:   *Cholelithiasis/Cholecystitis  - patient remains afebrile  - WBC count is within normal limits  - patient was started on Unasyn on admission  - surgery consult follow up - patient is on OR now for cholecystectomy - continue antibiotics  - analgesia provided   Active Problems:   Atrial fibrillation  - rate controlled with cardizem and metoprolol  - hold coumadin in the setting of possible hemorrhagic cystitis   History of embolic stroke  - hold aspirin and coumadin for now   Hemorrhagic cystitis  - follow up surgery  - hold aspirin and coumadin   Leukocytosis  - perhaps secondary to cholecystitis  - resolved at present  - continue unasyn   Hyponatremia  - Possible etiology is dehydration versus SIADH secondary to malignant process (gallbladder)  - IV fluids, NS  - resolved   Hypokalemia  - repleted;  - follow up BMP in am   EDUCATION  - test results and diagnostic studies were discussed with patientat the bedside  - patient has verbalized the understanding  - questions were answered at the bedside and contact information was provided for additional questions or concerns  - PT evaluation - no follow up needed - SLP - evaluation for dysphagia postponed until after surgery   Subjective: No events overnight. Patient denies chest pain, shortness of breath, abdominal pain. Patient reports no longer has dysphagia.  Objective:  Vital signs in last 24 hours:  Filed Vitals:   03/10/12 1316 03/10/12 2200 03/11/12 0525 03/11/12 1720  BP: 165/95 179/95 153/86   Pulse: 75 78 71   Temp: 97.1 F (36.2 C) 99.4 F (37.4 C) 98.1 F (36.7 C) 98 F (36.7 C)  TempSrc: Oral Oral Oral   Resp: 18 18 18    Height:      Weight:      SpO2: 98% 98% 95% 99%    Intake/Output from previous day:   Intake/Output Summary (Last 24 hours) at 03/11/12 1737 Last  data filed at 03/11/12 1627  Gross per 24 hour  Intake   1100 ml  Output     20 ml  Net   1080 ml    Physical Exam: General: Alert, awake, oriented x3, in no acute distress. HEENT: No bruits, no goiter. Moist mucous membranes, no scleral icterus, no conjunctival pallor. Heart: irregular rate and rhythm, S1/S2 +, no murmurs, rubs, gallops. Lungs: Clear to auscultation bilaterally. No wheezing, no rhonchi, no rales.  Abdomen: Soft, nontender, nondistended, positive bowel sounds. Extremities: No clubbing or cyanosis, no pitting edema,  positive pedal pulses. Neuro: Grossly nonfocal.  Lab Results:  Lab 03/11/12 0535 03/10/12 1029 03/09/12 0850 03/08/12 1159  WBC 6.2 7.3 9.9 12.9*  HGB 10.7* 12.2 12.5 13.3  HCT 31.0* 35.1* 36.3 38.4  PLT 176 210 234 257  MCV 88.8 89.3 88.8 90.6    Lab 03/11/12 0535 03/10/12 1029 03/09/12 0850 03/08/12 1159  NA 136 136 130* 134*  K 2.8* 3.5 3.0* 3.4*  CL 102 101 93* 93*  CO2 23 26 25 31   GLUCOSE 80 119* 124* 136*  BUN 8 12 15  24*  CREATININE 0.63 0.67 0.77 0.97  CALCIUM 9.0 9.6 9.6 10.3    Lab 03/11/12 0535 03/10/12 0542 03/09/12 0850 03/08/12 1159  INR 1.97* 2.28* 2.32* 1.97*   Studies/Results: Dg Cholangiogram Operative 03/11/2012   IMPRESSION  Negative for retained common  duct stone.     Medications: Scheduled Meds:   . ampicillin-sulbactam (UNASYN) IV  1.5 g Intravenous Q6H  . diltiazem  180 mg Oral Daily  . docusate sodium  100 mg Oral BID  . fenofibrate  54 mg Oral Daily  . levothyroxine  50 mcg Oral Daily  . metoprolol  50 mg Oral BID  . potassium chloride  10 mEq Intravenous Q1 Hr x 6  . senna  1 tablet Oral BID    LOS: 3 days   Lisa Lucero 03/11/2012, 5:37 PM  [facility] Pager: [phone]

## 2012-03-11 NOTE — Progress Notes (Signed)
Speech Language/Pathology SLP Cancellation Note  Treatment cancelled today due to patient receiving procedure or test.  Noted that patient now with plans to go to the OR. Remains NPO. SLP will sign off. Please re-consult for swallow evaluation if needed once patient able to resume pos.   Ferdinand Lango MA, CCC-SLP [phone]  Ferdinand Lango Meryl 03/11/2012, 8:37 AM

## 2012-03-11 NOTE — Progress Notes (Signed)
INITIAL ADULT NUTRITION ASSESSMENT Date: 03/11/2012   Time: 10:47 AM  Reason for Assessment: Consult: Dysphagia  ASSESSMENT: Female 77 y.o.  Dx: Gallstone  Hx:  Past Medical History  Diagnosis Date  . Paroxysmal atrial fibrillation   . History of embolic stroke 06/2010  . Erosive gastritis     with GI bleed thought due to elevated INR and duodenitis  . Hyperthyroidism   . Peripheral edema   . Long-term (current) use of anticoagulants   . HTN (hypertension)   . COPD (chronic obstructive pulmonary disease)   . Stroke   . GERD (gastroesophageal reflux disease)     Related Meds:     . ampicillin-sulbactam (UNASYN) IV  1.5 g Intravenous Q6H  . diltiazem  180 mg Oral Daily  . docusate sodium  100 mg Oral BID  . fenofibrate  54 mg Oral Daily  . levothyroxine  50 mcg Oral Daily  . metoprolol  50 mg Oral BID  . potassium chloride  10 mEq Intravenous Q1 Hr x 6  . senna  1 tablet Oral BID     Ht: 5\' 6"  (167.6 cm)  Wt: 123 lb 1.6 oz (55.838 kg)  Ideal Wt: 59.1 kg % Ideal Wt: 95%  Usual Wt: 62.7 kg per patient report 1.5 years ago % Usual Wt: 89%  Body mass index is 19.87 kg/(m^2). WNL  Food/Nutrition Related Hx: Patient had minimal hyponatermia, hypocloremia, and hypokalemia on admission. Was given IVF's and repleted with K. History of mild dysphagia per nutrition screen. No wounds or BM to document  SLP attempted to evaluate but unable to do so because of patient's lapaorscopy and probable cholecystectomy (3/12)  Patient reported dysphagia is more related to a swollen-feeling in her throat, which is resolving itself and not inhibited her eating abilities. Stated she has had a good appetite, and is able to tolerate most foods without any difficulty swallowing.   Weight loss of 16 lbs occurred approximately a year and a half ago, and patient has not been able to regain weight back. Patient expressed a desire to be closer to her usual weight.  Labs:  CMP     Component  Value Date/Time   NA 136 03/11/2012 0535   K 2.8* 03/11/2012 0535   CL 102 03/11/2012 0535   CO2 23 03/11/2012 0535   GLUCOSE 80 03/11/2012 0535   BUN 8 03/11/2012 0535   CREATININE 0.63 03/11/2012 0535   CALCIUM 9.0 03/11/2012 0535   PROT 5.7* 03/11/2012 0535   ALBUMIN 3.2* 03/11/2012 0535   AST 25 03/11/2012 0535   ALT 16 03/11/2012 0535   ALKPHOS 34* 03/11/2012 0535   BILITOT 0.6 03/11/2012 0535   GFRNONAA 86* 03/11/2012 0535   GFRAA >90 03/11/2012 0535  Abnormal amounts of glucose present in urine  CBG (last 3)   Basename 03/11/12 0802 03/10/12 0734 03/09/12 0803  GLUCAP 74 85 119*    Intake: No intake or output data in the 24 hours ending 03/11/12 1051   Diet Order: NPO  Supplements/Tube Feeding:N/A  IVF: N/A  Estimated Nutritional Needs:   Kcal:1500 - 1700 Protein: 75 - 85 Fluid: > 1.5 L  Dicussed supplement options due to patient expressing difficulty in regaining lost weight. Has tried some supplements before, but has not like the taste of them. Discussed with patient trying Ensure Complete BID as a way to increase protein and energy intake once diet order has been advanced.  NUTRITION DIAGNOSIS: -Inadequate protein energy intake (NI-5.3).  Status: Ongoing  RELATED TO:  History of dysphagia  AS EVIDENCE BY: unable to regain unintentional weight loss of 16 lbs   MONITORING/EVALUATION(Goals): Goal: Consume >/= 90% of estimated needs Monitor: Diet advancements, SLP consults, PO intake, weights, labs, supplement tolerance  EDUCATION NEEDS: -No education needs identified at this time  INTERVENTION:  Once diet order is advanced, recommend supplementing Ensure Complete BID to provide additional 700 kcal, 26 gram protein, and 360 ml free water  RD to follow nutrition care plan  Dietitian #[phone]  DOCUMENTATION CODES Per approved criteria  -Not Applicable    Lloyd Huger 03/11/2012, 10:47 AM  Hettie Holstein [phone]

## 2012-03-11 NOTE — Progress Notes (Signed)
Pt to or.  The procedure has been discussed with the patient. Operative and non operative treatments have been discussed. Risks of surgery include bleeding, infection,  Common bile duct injury,  Injury to the stomach,liver, colon,small intestine, abdominal wall,  Diaphragm,  Major blood vessels,  And the need for an open procedure.  Other risks include worsening of medical problems, death,  DVT and pulmonary embolism, and cardiovascular events.   Medical options have also been discussed. The patient has been informed of long term expectations of surgery and non surgical options,  The patient agrees to proceed.

## 2012-03-11 NOTE — Anesthesia Preprocedure Evaluation (Signed)
Anesthesia Evaluation  Patient identified by MRN, date of birth, ID band Patient awake    Reviewed: Allergy & Precautions, H&P , NPO status , Patient's Chart, lab work & pertinent test results  Airway Mallampati: II  Neck ROM: full    Dental   Pulmonary COPD         Cardiovascular hypertension, + dysrhythmias Atrial Fibrillation     Neuro/Psych CVA    GI/Hepatic GERD-  ,  Endo/Other  Hyperthyroidism   Renal/GU      Musculoskeletal   Abdominal   Peds  Hematology   Anesthesia Other Findings   Reproductive/Obstetrics                           Anesthesia Physical Anesthesia Plan  ASA: III  Anesthesia Plan: General   Post-op Pain Management:    Induction: Intravenous  Airway Management Planned: Oral ETT  Additional Equipment:   Intra-op Plan:   Post-operative Plan: Extubation in OR  Informed Consent: I have reviewed the patients History and Physical, chart, labs and discussed the procedure including the risks, benefits and alternatives for the proposed anesthesia with the patient or authorized representative who has indicated his/her understanding and acceptance.     Plan Discussed with: CRNA and Surgeon  Anesthesia Plan Comments:         Anesthesia Quick Evaluation

## 2012-03-11 NOTE — Interval H&P Note (Signed)
History and Physical Interval Note:  03/11/2012 2:44 PM  Lisa Lucero  has presented today for surgery, with the diagnosis of Gallstones  The various methods of treatment have been discussed with the patient and family. After consideration of risks, benefits and other options for treatment, the patient has consented to  Procedure(s) (LRB): LAPAROSCOPIC CHOLECYSTECTOMY WITH INTRAOPERATIVE CHOLANGIOGRAM (N/A) as a surgical intervention .  The patients' history has been reviewed, patient examined, no change in status, stable for surgery.  I have reviewed the patients' chart and labs.  Questions were answered to the patient's satisfaction.     Trevaris Pennella A.

## 2012-03-11 NOTE — Progress Notes (Signed)
Patient ID: Lisa Lucero, female   DOB: [birth year], 77 y.o.   MRN: [id number] Called by RN regarding bleeding around JP drain.  There was oozing around JP site likely from the skin edge.  I placed a pursestring 2-0 nylon suture under sterile technique.  2% lido with epi for local. Good hemostasis and patient tolerated it well. Violeta Gelinas, MD, MPH, FACS Pager: [phone]

## 2012-03-11 NOTE — Anesthesia Procedure Notes (Signed)
Procedure Name: Intubation Date/Time: 03/11/2012 3:17 PM Performed by: Delbert Harness Pre-anesthesia Checklist: Patient identified, Timeout performed, Emergency Drugs available, Suction available and Patient being monitored Patient Re-evaluated:Patient Re-evaluated prior to inductionOxygen Delivery Method: Circle system utilized Preoxygenation: Pre-oxygenation with 100% oxygen Intubation Type: IV induction Ventilation: Mask ventilation without difficulty Laryngoscope Size: Mac and 3 Grade View: Grade I Tube type: Oral Tube size: 7.5 mm Number of attempts: 1 Airway Equipment and Method: Stylet Placement Confirmation: ETT inserted through vocal cords under direct vision,  positive ETCO2 and breath sounds checked- equal and bilateral Secured at: 21 cm Tube secured with: Tape Dental Injury: Teeth and Oropharynx as per pre-operative assessment

## 2012-03-11 NOTE — Preoperative (Signed)
Beta Blockers   Reason not to administer Beta Blockers:Not Applicable 

## 2012-03-11 NOTE — Anesthesia Postprocedure Evaluation (Signed)
Anesthesia Post Note  Patient: Lisa Lucero  Procedure(s) Performed: Procedure(s) (LRB): LAPAROSCOPIC CHOLECYSTECTOMY WITH INTRAOPERATIVE CHOLANGIOGRAM (N/A)  Anesthesia type: General  Patient location: PACU  Post pain: Pain level controlled and Adequate analgesia  Post assessment: Post-op Vital signs reviewed, Patient's Cardiovascular Status Stable, Respiratory Function Stable, Patent Airway and Pain level controlled  Last Vitals:  Filed Vitals:   03/11/12 1735  BP: 164/81  Pulse: 73  Temp:   Resp: 21    Post vital signs: Reviewed and stable  Level of consciousness: awake, alert  and oriented  Complications: No apparent anesthesia complications

## 2012-03-11 NOTE — Progress Notes (Signed)
Physical Therapy Evaluation  Past Surgical History  Procedure Date  . Hemorrhoid surgery   . Operative hysteroscopy   . Abdominal hysterectomy    Past Medical History  Diagnosis Date  . Paroxysmal atrial fibrillation   . History of embolic stroke 06/2010  . Erosive gastritis     with GI bleed thought due to elevated INR and duodenitis  . Hyperthyroidism   . Peripheral edema   . Long-term (current) use of anticoagulants   . HTN (hypertension)   . COPD (chronic obstructive pulmonary disease)   . Stroke   . GERD (gastroesophageal reflux disease)       03/11/12 1100  PT Visit Information  Last PT Received On 03/11/12  Patient Stated Goals  Goal #1 Back to normal  Precautions  Precautions (None)  Restrictions  Weight Bearing Restrictions No  Home Living  Lives With Spouse  Receives Help From Family  Type of Home House  Home Layout Two level;Able to live on main level with bedroom/bathroom  Home Access Stairs to enter  Entrance Stairs-Rails None  Entrance Stairs-Number of Steps 1  Home Adaptive Equipment None  Prior Function  Level of Independence Independent with basic ADLs;Independent with homemaking with ambulation;Independent with gait;Independent with transfers  Able to Take Stairs? Yes  Driving Yes  Cognition  Overall Cognitive Status Appears within functional limits for tasks assessed  Bed Mobility  Bed Mobility No (Sitting EOB)  Transfers  Transfers Yes  Sit to Stand 7: Independent  Stand to Sit 7: Independent  Ambulation/Gait  Ambulation/Gait Yes  Ambulation/Gait Assistance 7: Independent  Ambulation Distance (Feet) 150 Feet  Assistive device None  Gait Pattern WFL  Stairs No  Wheelchair Mobility  Wheelchair Mobility No  Posture/Postural Control  Posture/Postural Control No significant limitations  Balance  Balance Assessed No  RLE Assessment  RLE Assessment WFL  LLE Assessment  LLE Assessment WFL  PT - End of Session  Equipment Utilized  During Treatment Gait belt  Activity Tolerance Patient tolerated treatment well  Patient left in bed;with call bell in reach (Sitting EOB)  Nurse Communication Mobility status for transfers;Mobility status for ambulation  General  Behavior During Session [facility] for tasks performed  Cognition [facility] for tasks performed  PT Assessment  Clinical Impression Statement pt presents for Gall Bladder surgery.  pt Independent with all mobility.  Spoke with RN and CNA about allowing pt to ambulate after surgery, as pt notes she was told she wasn't allowed to get up.  pt is Independent and want pt to remain Independent with all mobility.    PT Recommendation/Assessment Patent does not need any further PT services  No Skilled PT Patient at baseline level of functioning;Patient is independent with all acitivity/mobility  PT Recommendation  Follow Up Recommendations No PT follow up  Equipment Recommended None recommended by PT    Mack Hook, PT [phone]

## 2012-03-11 NOTE — Progress Notes (Signed)
  Subjective: Pt feeling ok. Pain better. NO N/V   Objective: Vital signs in last 24 hours: Temp:  [97.1 F (36.2 C)-99.4 F (37.4 C)] 98.1 F (36.7 C) (03/12 0525) Pulse Rate:  [71-79] 71  (03/12 0525) Resp:  [18] 18  (03/12 0525) BP: (153-179)/(86-96) 153/86 mmHg (03/12 0525) SpO2:  [95 %-98 %] 95 % (03/12 0525) Last BM Date: 03/10/12  Intake/Output this shift:    Physical Exam: BP 153/86  Pulse 71  Temp(Src) 98.1 F (36.7 C) (Oral)  Resp 18  Ht 5\' 6"  (1.676 m)  Wt 55.838 kg (123 lb 1.6 oz)  BMI 19.87 kg/m2  SpO2 95% Abdomen: soft, less tender RUQ today, no mass.  Labs: CBC  Basename 03/11/12 0535 03/10/12 1029  WBC 6.2 7.3  HGB 10.7* 12.2  HCT 31.0* 35.1*  PLT 176 210   BMET  Basename 03/11/12 0535 03/10/12 1029  NA 136 136  K 2.8* 3.5  CL 102 101  CO2 23 26  GLUCOSE 80 119*  BUN 8 12  CREATININE 0.63 0.67  CALCIUM 9.0 9.6   LFT  Basename 03/11/12 0535 03/08/12 1159  PROT 5.7* --  ALBUMIN 3.2* --  AST 25 --  ALT 16 --  ALKPHOS 34* --  BILITOT 0.6 --  BILIDIR -- --  IBILI -- --  LIPASE -- 19   PT/INR  Basename 03/11/12 0535 03/10/12 0542  LABPROT 22.8* 25.5*  INR 1.97* 2.28*   ABG No results found for this basename: PHART:2,PCO2:2,PO2:2,HCO3:2 in the last 72 hours  Studies/Results: No results found.  Assessment/Plan:  Principal Problem:  RUQ pain secondary to biliary process, possible hemorrhagic cholecystitis and gallstone, possible but less likely mass/tumor. Mass effect on duodenum  Atrial fibrillation-Coumadin on hold- INR now less than 2.0 History of embolic stroke  Leukocytosis-normalized  LFTs normal on admission Hypokalemia-replete today Will take to OR for laparoscopy and probable cholecystectomy. I discussed the procedure in detail.  We discussed the risks and benefits of a laparoscopic cholecystectomy and possible cholangiogram including, but not limited to bleeding, infection, injury to surrounding structures such  as the intestine or liver, bile leak, retained gallstones, need to convert to an open procedure, prolonged diarrhea, blood clots such as  DVT, common bile duct injury, anesthesia risks, and possible need for additional procedures.  The likelihood of improvement in symptoms and return to the patient's normal status is good. We discussed the typical post-operative recovery course.   LOS: 3 days    Alyse Low 03/11/2012 8:11 AM

## 2012-03-11 NOTE — Op Note (Signed)
Laparoscopic Cholecystectomy with IOC Procedure Note  Indications: This patient presents with symptomatic gallbladder disease and will undergo laparoscopic cholecystectomy.  Pre-operative Diagnosis: Hemorrhagic cholecystitis  Post-operative Diagnosis: Same  Surgeon: Tabitha Riggins A.   Assistants: Dr Janee Morn MD  Anesthesia: General endotracheal anesthesia and Local anesthesia 0.25.% bupivacaine, with epinephrine  ASA Class: 3  Procedure Details  The patient was seen again in the Holding Room. The risks, benefits, complications, treatment options, and expected outcomes were discussed with the patient. The possibilities of reaction to medication, pulmonary aspiration, perforation of viscus, bleeding, recurrent infection, finding a normal gallbladder, the need for additional procedures, failure to diagnose a condition, the possible need to convert to an open procedure, and creating a complication requiring transfusion or operation were discussed with the patient. The patient and/or family concurred with the proposed plan, giving informed consent. The site of surgery properly noted/marked. The patient was taken to Operating Room, identified as Annett Fabian and the procedure verified as Laparoscopic Cholecystectomy with Intraoperative Cholangiograms. A Time Out was held and the above information confirmed.  Prior to the induction of general anesthesia, antibiotic prophylaxis was administered. General endotracheal anesthesia was then administered and tolerated well. After the induction, the abdomen was prepped in the usual sterile fashion. The patient was positioned in the supine position with the left arm comfortably tucked, along with some reverse Trendelenburg.  Local anesthetic agent was injected into the skin near the umbilicus and an incision made. The midline fascia was incised and the Hasson technique was used to introduce a 10 mm port under direct vision. It was secured with a figure of  eight Vicryl suture placed in the usual fashion. Pneumoperitoneum was then created with CO2 and tolerated well without any adverse changes in the patient's vital signs. Additional trocars were introduced under direct vision. All skin incisions were infiltrated with a local anesthetic agent before making the incision and placing the trocars.   She had significant hemorrhagic cholecystitis,  The gallbladder was decompressed with the Nazot.  The gallbladder was necrotic.  The gallbladder was identified, the fundus grasped and retracted cephalad. Adhesions were lysed bluntly and with the electrocautery where indicated, taking care not to injure any adjacent organs or viscus. The infundibulum was grasped and retracted laterally, exposing the peritoneum overlying the triangle of Calot. This was then divided and exposed in a blunt fashion. The cystic duct was clearly identified and bluntly dissected circumferentially. The junctions of the gallbladder, cystic duct and common bile duct were clearly identified prior to the division of any linear structure and photo documented.   An incision was made in the cystic duct and the cholangiogram catheter introduced. The catheter was secured using an endoclip. The study showed no stones and good visualization of the distal and proximal biliary tree. The catheter was then removed.   The cystic duct was then doubly ligated with surgical clips  on the patient side and singly clipped on the gallbladder side and divided. The cystic artery was identified, dissected free, ligated with clips and divided as well.   The gallbladder was dissected from the liver bed in retrograde fashion with the electrocautery. The gallbladder was removed. The liver bed was irrigated and inspected. Hemostasis was achieved with the electrocautery. Copious irrigation was utilized and was repeatedly aspirated until clear all particulate matter.  Sugicel and drain placed in fossa.  It was brought out the  lateral port site.  Pneumoperitoneum was completely reduced after viewing removal of the trocars under direct vision. The  wound was thoroughly irrigated and the fascia was then closed with a figure of eight suture; the skin was then closed with 4 o monocryl.  Dermabond applied. and a sterile dressing was applied.  Instrument, sponge, and needle counts were correct at closure and at the conclusion of the case.   Findings: Cholecystitis with Cholelithiasis  Estimated Blood Loss: less than 100 mL         Drains: 19 french         Total IV Fluids:         Specimens: Gallbladder           Complications: None; patient tolerated the procedure well.         Disposition: PACU - hemodynamically stable.         Condition: stable

## 2012-03-11 NOTE — Transfer of Care (Signed)
Immediate Anesthesia Transfer of Care Note  Patient: Lisa Lucero  Procedure(s) Performed: Procedure(s) (LRB): LAPAROSCOPIC CHOLECYSTECTOMY WITH INTRAOPERATIVE CHOLANGIOGRAM (N/A)  Patient Location: PACU  Anesthesia Type: General  Level of Consciousness: awake and alert   Airway & Oxygen Therapy: Patient Spontanous Breathing and Patient connected to nasal cannula oxygen  Post-op Assessment: Report given to PACU RN and Post -op Vital signs reviewed and stable  Post vital signs: Reviewed and stable  Complications: No apparent anesthesia complications

## 2012-03-12 LAB — BASIC METABOLIC PANEL
BUN: 10 mg/dL (ref 6–23)
CO2: 23 mEq/L (ref 19–32)
Calcium: 9.1 mg/dL (ref 8.4–10.5)
Chloride: 103 mEq/L (ref 96–112)
Creatinine, Ser: 0.72 mg/dL (ref 0.50–1.10)
GFR calc Af Amer: >90 mL/min (ref >90)
GFR calc non Af Amer: 83 mL/min — ABNORMAL LOW (ref >90)
Glucose, Bld: 83 mg/dL (ref 70–99)
Potassium: 3.1 mEq/L — ABNORMAL LOW (ref 3.5–5.1)
Sodium: 136 mEq/L (ref 135–145)

## 2012-03-12 LAB — CBC
HCT: 31.3 % — ABNORMAL LOW (ref 36.0–46.0)
Hemoglobin: 10.6 g/dL — ABNORMAL LOW (ref 12.0–15.0)
MCH: 30.3 pg (ref 26.0–34.0)
MCHC: 33.9 g/dL (ref 30.0–36.0)
MCV: 89.4 fL (ref 78.0–100.0)
Platelets: 213 10*3/uL (ref 150–400)
RBC: 3.5 MIL/uL — ABNORMAL LOW (ref 3.87–5.11)
RDW: 13.8 % (ref 11.5–15.5)
WBC: 7.3 10*3/uL (ref 4.0–10.5)

## 2012-03-12 LAB — GLUCOSE, CAPILLARY: Glucose-Capillary: 86 mg/dL (ref 70–99)

## 2012-03-12 LAB — PROTIME-INR: Prothrombin Time: 22.2 seconds — ABNORMAL HIGH (ref 11.6–15.2)

## 2012-03-12 NOTE — Progress Notes (Signed)
1 Day Post-Op  Subjective: Pt ok, Sore as expected. No N/V, tolerated clears so far. Passing flatus, voiding well. No further bleeding from around drain  Objective: Vital signs in last 24 hours: Temp:  [98 F (36.7 C)-100.2 F (37.9 C)] 98.3 F (36.8 C) (03/13 0441) Pulse Rate:  [68-91] 78  (03/13 0441) Resp:  [10-37] 20  (03/13 0441) BP: (164-189)/(81-99) 189/99 mmHg (03/13 0441) SpO2:  [93 %-100 %] 98 % (03/13 0441) Last BM Date: 03/10/12  Intake/Output this shift: Total I/O In: 360 [P.O.:360] Out: -   Physical Exam: BP 189/99  Pulse 78  Temp(Src) 98.3 F (36.8 C) (Oral)  Resp 20  Ht 5\' 6"  (1.676 m)  Wt 55.838 kg (123 lb 1.6 oz)  BMI 19.87 kg/m2  SpO2 98% Lungs: CTA without w/r/r Heart: Regular Abdomen: soft, ND, appropriately tender   Incisions all c/d/i without erythema or hematoma.   Drain intact, SS output, no bile Ext: No edema or tenderness   Labs: CBC  Basename 03/12/12 0725 03/11/12 0535  WBC 7.3 6.2  HGB 10.6* 10.7*  HCT 31.3* 31.0*  PLT 213 176   BMET  Basename 03/12/12 0725 03/11/12 0535  NA 136 136  K 3.1* 2.8*  CL 103 102  CO2 23 23  GLUCOSE 83 80  BUN 10 8  CREATININE 0.72 0.63  CALCIUM 9.1 9.0   LFT  Basename 03/11/12 0535  PROT 5.7*  ALBUMIN 3.2*  AST 25  ALT 16  ALKPHOS 34*  BILITOT 0.6  BILIDIR --  IBILI --  LIPASE --   PT/INR  Basename 03/12/12 0725 03/11/12 0535  LABPROT 22.2* 22.8*  INR 1.91* 1.97*   ABG No results found for this basename: PHART:2,PCO2:2,PO2:2,HCO3:2 in the last 72 hours  Studies/Results: Dg Cholangiogram Operative  03/11/2012  *RADIOLOGY REPORT*  Clinical Data:   Cholelithiasis  INTRAOPERATIVE CHOLANGIOGRAM  Technique:  Cholangiographic images from the C-arm fluoroscopic device were submitted for interpretation post-operatively.  Please see the procedural report for the amount of contrast and the fluoroscopy time utilized.  Comparison:  None  Findings:  No persistent filling defects in the  common duct. Intrahepatic ducts are incompletely visualized, appearing decompressed centrally. Contrast passes into the duodenum.  IMPRESSION  Negative for retained common duct stone.  Original Report Authenticated By: Osa Craver, M.D.    Assessment: Principal Problem:  *Gallstone and hemorrhagic cholecystitis-path pending Active Problems:  Atrial fibrillation  History of embolic stroke  Leukocytosis  Hyponatremia  Hypochloremia  Hypokalemia   Procedure(s): LAPAROSCOPIC CHOLECYSTECTOMY WITH INTRAOPERATIVE CHOLANGIOGRAM  Plan: Advance diet Hold Coumadin one more day, if labs stable in am and drain output thin, can resume tomorrow.  LOS: 4 days    Lisa Lucero 03/12/2012 9:14 AM

## 2012-03-12 NOTE — Progress Notes (Signed)
Agree she is doing well

## 2012-03-12 NOTE — Progress Notes (Signed)
Patient ID: Lisa Lucero, female   DOB: [birth year], 77 y.o.   MRN: [id number]  Assessment/Plan:   Principal Problem:   *Cholelithiasis/Hemorrhagic Cholecystitis  S/p lap chole yesterday DC Unasyn  Pathology pending  Atrial fibrillation  - rate controlled with cardizem and metoprolol  Restart coumadin when ok with CCS, ? tomorrow  History of embolic stroke  - hold aspirin and coumadin for now   Hemorrhagic cystitis  - follow up surgery  - hold aspirin and coumadin   Leukocytosis  - perhaps secondary to cholecystitis  - resolved at present  DC unasyn  Hyponatremia :resolved  Hypokalemia :replace    Subjective: No events overnight. Patient denies chest pain, shortness of breath, abdominal pain  Objective:  Vital signs in last 24 hours:  Filed Vitals:   03/12/12 1000 03/12/12 1107 03/12/12 1133 03/12/12 1400  BP: 158/101 158/101 139/81 154/93  Pulse: 89   77  Temp: 97.5 F (36.4 C)   97.6 F (36.4 C)  TempSrc: Oral   Oral  Resp: 18   16  Height:      Weight:      SpO2: 98%   97%    Intake/Output from previous day:   Intake/Output Summary (Last 24 hours) at 03/12/12 1600 Last data filed at 03/12/12 1400  Gross per 24 hour  Intake   1060 ml  Output    265 ml  Net    795 ml    Physical Exam: General: Alert, awake, oriented x3, in no acute distress. HEENT: No bruits, no goiter. Moist mucous membranes, no scleral icterus, no conjunctival pallor. Heart: irregular rate and rhythm, S1/S2 +, no murmurs, rubs, gallops. Lungs: Clear to auscultation bilaterally. No wheezing, no rhonchi, no rales.  Abdomen: Soft, nontender, nondistended, positive bowel sounds. Extremities: No clubbing or cyanosis, no pitting edema,  positive pedal pulses. Neuro: Grossly nonfocal.  Lab Results:  Lab 03/11/12 0535 03/10/12 1029 03/09/12 0850 03/08/12 1159  WBC 6.2 7.3 9.9 12.9*  HGB 10.7* 12.2 12.5 13.3  HCT 31.0* 35.1* 36.3 38.4  PLT 176 210 234 257  MCV 88.8 89.3 88.8  90.6    Lab 03/11/12 0535 03/10/12 1029 03/09/12 0850 03/08/12 1159  NA 136 136 130* 134*  K 2.8* 3.5 3.0* 3.4*  CL 102 101 93* 93*  CO2 23 26 25 31   GLUCOSE 80 119* 124* 136*  BUN 8 12 15  24*  CREATININE 0.63 0.67 0.77 0.97  CALCIUM 9.0 9.6 9.6 10.3    Lab 03/11/12 0535 03/10/12 0542 03/09/12 0850 03/08/12 1159  INR 1.97* 2.28* 2.32* 1.97*   Studies/Results: Dg Cholangiogram Operative 03/11/2012   IMPRESSION  Negative for retained common duct stone.     Medications: Scheduled Meds:    . ampicillin-sulbactam (UNASYN) IV  1.5 g Intravenous Q6H  . diltiazem  180 mg Oral Daily  . docusate sodium  100 mg Oral BID  . fenofibrate  54 mg Oral Daily  . heparin subcutaneous  5,000 Units Subcutaneous Q8H  . levothyroxine  50 mcg Oral Daily  . metoprolol  50 mg Oral BID  . potassium chloride  10 mEq Intravenous Q1 Hr x 6  . senna  1 tablet Oral BID    LOS: 4 days   Shenia Alan 03/12/2012, 4:00 PM  [facility] Pager: [phone]

## 2012-03-13 LAB — CBC
HCT: 33 % — ABNORMAL LOW (ref 36.0–46.0)
MCHC: 34.5 g/dL (ref 30.0–36.0)
RDW: 13.7 % (ref 11.5–15.5)

## 2012-03-13 LAB — PROTIME-INR
INR: 1.61 — ABNORMAL HIGH (ref 0.00–1.49)
Prothrombin Time: 19.4 seconds — ABNORMAL HIGH (ref 11.6–15.2)

## 2012-03-13 MED ORDER — OXYCODONE-ACETAMINOPHEN 5-325 MG PO TABS: 2.0000 | ORAL_TABLET | ORAL | Status: AC | PRN

## 2012-03-13 MED ORDER — WARFARIN SODIUM 5 MG PO TABS: 2.5000 mg | ORAL_TABLET | Freq: Every day | ORAL | Status: DC

## 2012-03-13 MED ORDER — HYDROCODONE-ACETAMINOPHEN 7.5-750 MG PO TABS: 1.0000 | ORAL_TABLET | Freq: Four times a day (QID) | ORAL | Status: DC | PRN

## 2012-03-13 NOTE — Progress Notes (Signed)
2 Days Post-Op  Subjective: Pt ok. Sore. Tol diet Walking and voiding well. Pain control ok.  Objective: Vital signs in last 24 hours: Temp:  [97.1 F (36.2 C)-98.1 F (36.7 C)] 98.1 F (36.7 C) (03/14 0600) Pulse Rate:  [77-89] 82  (03/14 0600) Resp:  [16-20] 18  (03/14 0600) BP: (131-158)/(79-101) 155/86 mmHg (03/14 0600) SpO2:  [96 %-98 %] 96 % (03/14 0600) Last BM Date: 03/10/12  Intake/Output this shift:    Physical Exam: BP 155/86  Pulse 82  Temp(Src) 98.1 F (36.7 C) (Oral)  Resp 18  Ht 5\' 6"  (1.676 m)  Wt 55.838 kg (123 lb 1.6 oz)  BMI 19.87 kg/m2  SpO2 96% Lungs: CTA without w/r/r Heart: Regular Abdomen: soft, ND, appropriately tender   Incisions all c/d/i without erythema or hematoma.  Drain intact, thin Ss output-drain removed at bedside by myself. Ext: No edema or tenderness   Labs: CBC  Basename 03/13/12 0630 03/12/12 0725  WBC 6.3 7.3  HGB 11.4* 10.6*  HCT 33.0* 31.3*  PLT 261 213   BMET  Basename 03/12/12 0725 03/11/12 0535  NA 136 136  K 3.1* 2.8*  CL 103 102  CO2 23 23  GLUCOSE 83 80  BUN 10 8  CREATININE 0.72 0.63  CALCIUM 9.1 9.0   LFT  Basename 03/11/12 0535  PROT 5.7*  ALBUMIN 3.2*  AST 25  ALT 16  ALKPHOS 34*  BILITOT 0.6  BILIDIR --  IBILI --  LIPASE --   PT/INR  Basename 03/13/12 0630 03/12/12 0725  LABPROT 19.4* 22.2*  INR 1.61* 1.91*   ABG No results found for this basename: PHART:2,PCO2:2,PO2:2,HCO3:2 in the last 72 hours  Studies/Results: Dg Cholangiogram Operative  03/11/2012  *RADIOLOGY REPORT*  Clinical Data:   Cholelithiasis  INTRAOPERATIVE CHOLANGIOGRAM  Technique:  Cholangiographic images from the C-arm fluoroscopic device were submitted for interpretation post-operatively.  Please see the procedural report for the amount of contrast and the fluoroscopy time utilized.  Comparison:  None  Findings:  No persistent filling defects in the common duct. Intrahepatic ducts are incompletely visualized,  appearing decompressed centrally. Contrast passes into the duodenum.  IMPRESSION  Negative for retained common duct stone.  Original Report Authenticated By: Osa Craver, M.D.    Assessment: Principal Problem:  *Gallstone Active Problems:  Atrial fibrillation  History of embolic stroke  Hemorrhagic cystitis  Leukocytosis  Hyponatremia  Hypochloremia  Hypokalemia   Procedure(s): LAPAROSCOPIC CHOLECYSTECTOMY WITH INTRAOPERATIVE CHOLANGIOGRAM  Plan: Pt looks well from surgical standing. Drain removed. Hgb up. Can resume Coumadin and dc home Will not need additional abx Will leave follow up/instructions/pain Rx. Call if needed before dc.  LOS: 5 days    Lisa Lucero 03/13/2012 8:49 AM

## 2012-03-13 NOTE — Progress Notes (Signed)
Agree. Stable post op.  OK for discharge

## 2012-03-13 NOTE — Progress Notes (Signed)
   CARE MANAGEMENT NOTE 03/13/2012  Patient:  Lisa Lucero, Lisa Lucero   Account Number:  [id number]  Date Initiated:  03/13/2012  Documentation initiated by:  Darlyne Russian  Subjective/Objective Assessment:   Patient admitted with gallstones     Action/Plan:   Progression of care and discharge planning   Anticipated DC Date:  03/13/2012   Anticipated DC Plan:  HOME/SELF CARE      DC Planning Services  CM consult      Choice offered to / List presented to:             Status of service:  Completed, signed off Medicare Important Message given?   (If response is "NO", the following Medicare IM given date fields will be blank) Date Medicare IM given:   Date Additional Medicare IM given:    Discharge Disposition:  HOME/SELF CARE  Per UR Regulation:    If discussed at Long Length of Stay Meetings, dates discussed:    Comments:  03/13/2012  Darlyne Russian RN, CCM  4:00 pm  patient was discharged to home, no discharge needs.

## 2012-03-13 NOTE — Discharge Instructions (Addendum)
[facility] ______[facility] LAPAROSCOPIC SURGERY: POST OP INSTRUCTIONS Always review your discharge instruction sheet given to you by the facility where your surgery was performed. IF YOU HAVE DISABILITY OR FAMILY LEAVE FORMS, YOU MUST BRING THEM TO THE OFFICE FOR PROCESSING.   DO NOT GIVE THEM TO YOUR DOCTOR.  1. A prescription for pain medication may be given to you upon discharge.  Take your pain medication as prescribed, if needed.  If narcotic pain medicine is not needed, then you may take acetaminophen (Tylenol) or ibuprofen (Advil) as needed. 2. Take your usually prescribed medications unless otherwise directed. 3. If you need a refill on your pain medication, please contact your pharmacy.  They will contact our office to request authorization. Prescriptions will not be filled after 5pm or on week-ends. 4. You should follow a light diet the first few days after arrival home, such as soup and crackers, etc.  Be sure to include lots of fluids daily. 5. Most patients will experience some swelling and bruising in the area of the incisions.  Ice packs will help.  Swelling and bruising can take several days to resolve.  6. It is common to experience some constipation if taking pain medication after surgery.  Increasing fluid intake and taking a stool softener (such as Colace) will usually help or prevent this problem from occurring.  A mild laxative (Milk of Magnesia or Miralax) should be taken according to package instructions if there are no bowel movements after 48 hours. 7. Unless discharge instructions indicate otherwise, you may remove your bandages 24-48 hours after surgery, and you may shower at that time.   If your surgeon used skin glue on the incision, you may shower in 24 hours.  The glue will flake off over the next 2-3 weeks.  Any sutures or staples will be removed at the office during your follow-up visit. 8. ACTIVITIES:  You may resume regular (light) daily activities  beginning the next day--such as daily self-care, walking, climbing stairs--gradually increasing activities as tolerated.  You may have sexual intercourse when it is comfortable.  Refrain from any heavy lifting or straining until approved by your doctor. a. You may drive when you are no longer taking prescription pain medication, you can comfortably wear a seatbelt, and you can safely maneuver your car and apply brakes. b. RETURN TO WORK:  __________________________________________________________ 9. You should see your doctor in the office for a follow-up appointment approximately 2-3 weeks after your surgery.  Make sure that you call for this appointment within a day or two after you arrive home to insure a convenient appointment time. 10. OTHER INSTRUCTIONS: __________________________________________________________________________________________________________________________ __________________________________________________________________________________________________________________________ WHEN TO CALL YOUR DOCTOR: 1. Fever over 101.0 2. Inability to urinate 3. Continued bleeding from incision. 4. Increased pain, redness, or purulent drainage from the incision. 5. Increasing abdominal pain  The clinic staff is available to answer your questions during regular business hours.  Please don't hesitate to call and ask to speak to one of the nurses for clinical concerns.  If you have a medical emergency, go to the nearest emergency room or call 911.  A surgeon from [facility] is always on call at the hospital. [address], [address], [place], [place]  [address] ? [address] [address], [place], [place]   [address] [phone] ? [phone] ? FAX [phone] Web site: [email or web address]

## 2012-03-17 ENCOUNTER — Ambulatory Visit (INDEPENDENT_AMBULATORY_CARE_PROVIDER_SITE_OTHER): Payer: Medicare Other | Admitting: *Deleted

## 2012-03-17 ENCOUNTER — Encounter (HOSPITAL_COMMUNITY): Payer: Self-pay | Admitting: Surgery

## 2012-03-17 DIAGNOSIS — I4891 Unspecified atrial fibrillation: Secondary | ICD-10-CM

## 2012-03-24 ENCOUNTER — Ambulatory Visit (INDEPENDENT_AMBULATORY_CARE_PROVIDER_SITE_OTHER): Payer: Medicare Other | Admitting: Surgery

## 2012-03-24 ENCOUNTER — Encounter (INDEPENDENT_AMBULATORY_CARE_PROVIDER_SITE_OTHER): Payer: Self-pay | Admitting: Surgery

## 2012-03-24 VITALS — BP 142/94 | HR 56 | Temp 97.2°F | Resp 16 | Ht 66.5 in | Wt 123.4 lb

## 2012-03-24 DIAGNOSIS — Z9889 Other specified postprocedural states: Secondary | ICD-10-CM

## 2012-03-24 NOTE — Patient Instructions (Signed)
Follow up as needed

## 2012-03-24 NOTE — Progress Notes (Signed)
she is here for a postop visit following laparoscopic cholecystectomy.  Diet is being tolerated, bowels are moving.  No problems with incisions.  PE: soft nontender abdomen  ABD:  Soft, incisions clean/dry/intact and solid.  Assessment:  Doing well postop.  Plan:  Lowfat diet recommended.  Activities as tolerated.  Return visit prn. Path showed hemorrhagic cholecystitis

## 2012-03-27 NOTE — Discharge Summary (Signed)
Physician Discharge Summary  Patient ID: Lisa Lucero MRN: [id number] DOB/AGE: [birth year] 77 y.o.  Admit date: 03/08/2012 Discharge date: 03/27/2012  Primary Care Physician:  Herb Grays, MD, MD Surgeon: Dr. Harriette Bouillon with [facility]  Discharge Diagnoses:   1. Cholelithiasis 2. hemorrhagic Cholecystitis status post laparoscopic cholecystectomy biopsy pending 3. Atrial fibrillation on Coumadin 4. History of embolic stroke 5. Leukocytosis 6. Hyponatremia / hypochloremia and hypokalemia resolved  7. Dyslipidemia 8. history of COPD  Medication List  As of 03/27/2012  3:14 PM   STOP taking these medications         HYDROcodone-acetaminophen 7.5-750 MG per tablet      IRON PO         TAKE these medications         acetaminophen 500 MG tablet   Commonly known as: TYLENOL   Take 500 mg by mouth 2 (two) times daily.      CALCIUM PO   Take 1,000 mg by mouth daily.      diltiazem 180 MG 24 hr capsule   Commonly known as: DILACOR XR   Take 180 mg by mouth daily.      fenofibrate 54 MG tablet   Take 54 mg by mouth daily.      fluticasone 50 MCG/ACT nasal spray   Commonly known as: FLONASE   Place 2 sprays into the nose daily.      levothyroxine 50 MCG tablet   Commonly known as: SYNTHROID, LEVOTHROID   Take 50 mcg by mouth daily.      metoprolol 50 MG tablet   Commonly known as: LOPRESSOR   Take 50 mg by mouth 2 (two) times daily.      mulitivitamin with minerals Tabs   Take 1 tablet by mouth daily.      pantoprazole 40 MG tablet   Commonly known as: PROTONIX   Take 40 mg by mouth daily.      potassium chloride 10 MEQ tablet   Commonly known as: K-DUR   Take 10 mEq by mouth 2 (two) times daily.      traMADol 50 MG tablet   Commonly known as: ULTRAM   Take 50 mg by mouth 2 (two) times daily.      traZODone 50 MG tablet   Commonly known as: DESYREL   Take 25 mg by mouth at bedtime.      warfarin 5 MG tablet   Commonly known as: COUMADIN     Take 0.5-1 tablets (2.5-5 mg total) by mouth daily. Take 5mg  till Sunday then based on INR Monday am 3/18           Disposition and Follow-up:  Dr. Luisa Hart in 1week  Consults: Dr.Cornett [facility]  Significant Diagnostic Studies:  US Abdomen Complete 03/08/2012   IMPRESSION: Markedly abnormal gallbladder.  The structure is enlarged and filled with echogenic material and at least one gallstone.  The appearance is worrisome for a gallbladder mass, though I cannot demonstrate blood flow within it.  If this is not a mass, it would seem to represent advanced cholecystitis with pus filling the gallbladder.  However, the patient was not very tender, surprisingly.  I would recommend a CT scan to understand this more completely.  Original Report Authenticated By: Thomasenia Sales, M.D.   Ct Abdomen Pelvis W Contrast 03/08/2012   IMPRESSION:  1.  Dilated gallbladder containing a 2.2 cm gallstone and filled with medium density sludge or tumor. 2.  Medium density pericholecystic fluid tracking  inferiorly, as described above.  This may represent hemorrhage, potentially associated with hemorrhagic cholecystitis. 3.  The changes in and around the gallbladder are secondarily affecting the second portion of the duodenum, with mass effect on the duodenum. 4.  Biliary and pancreatic ductal dilatation with no visible obstructing stone or mass. 5.  COPD and borderline cardiomegaly.  These results were called by telephone on 03/08/2012  at  1850 hours to  Dr. Jeraldine Loots, who verbally acknowledged these results.  Original Report Authenticated By: Darrol Angel, M.D.   Procedure laparoscopic cholecystectomy by Dr. Harriette Bouillon on 03/11/12  Brief H and P: Lisa Lucero is an 77 y.o. female with a history of medical issues including Afib on chronic Coumadin. She had been doing well until Saturday morning when she developed some RUQ and epigastric abd pain. She took some OTC medicine for indigestion with no improvement. (Had a  prior hx of UGIB in 12/11 but (-) EGD, treated for gastritis with mild bleed secondary to anticoagulation.) She presented to ER and her workup was abnormal with [place] and CT findings of the gallbladder. Due to her medical issues, she was admitted by medicine service and surgery consult requested.  Hospital Course:  1. cholelithiasis with hemorrhagic cholecystitis: She underwent laparoscopic cholecystectomy by Dr. Luisa Hart on 03/11/12 tolerated the procedure well, a diet was slowly advanced and currently tolerating a regular diet. Biopsy/pathology from the OR is pending at this point and will need to be followed up on at the time of postop followup. 2.  Atrial fibrillation/History of embolic stroke: Her Coumadin was held peri-operatively and was resumed one day after surgery. Her INR is 1.6 the time of discharge. He is advised to have a repeat INR check in 4 days on Monday.  Time spent on Discharge:  Signed: Donalda Job [facility]  03/27/2012, 3:14 PM

## 2012-03-31 ENCOUNTER — Ambulatory Visit (INDEPENDENT_AMBULATORY_CARE_PROVIDER_SITE_OTHER): Payer: Medicare Other | Admitting: Pharmacist

## 2012-03-31 DIAGNOSIS — I4891 Unspecified atrial fibrillation: Secondary | ICD-10-CM | POA: Diagnosis not present

## 2012-03-31 LAB — POCT INR: INR: 1.9

## 2012-04-01 DIAGNOSIS — J309 Allergic rhinitis, unspecified: Secondary | ICD-10-CM | POA: Diagnosis not present

## 2012-04-21 ENCOUNTER — Ambulatory Visit (INDEPENDENT_AMBULATORY_CARE_PROVIDER_SITE_OTHER): Payer: Medicare Other

## 2012-04-21 DIAGNOSIS — I4891 Unspecified atrial fibrillation: Secondary | ICD-10-CM | POA: Diagnosis not present

## 2012-04-29 DIAGNOSIS — Z79890 Hormone replacement therapy (postmenopausal): Secondary | ICD-10-CM | POA: Diagnosis not present

## 2012-04-29 DIAGNOSIS — M949 Disorder of cartilage, unspecified: Secondary | ICD-10-CM | POA: Diagnosis not present

## 2012-04-29 DIAGNOSIS — M899 Disorder of bone, unspecified: Secondary | ICD-10-CM | POA: Diagnosis not present

## 2012-05-02 DIAGNOSIS — E782 Mixed hyperlipidemia: Secondary | ICD-10-CM | POA: Diagnosis not present

## 2012-05-02 DIAGNOSIS — F5102 Adjustment insomnia: Secondary | ICD-10-CM | POA: Diagnosis not present

## 2012-05-02 DIAGNOSIS — E039 Hypothyroidism, unspecified: Secondary | ICD-10-CM | POA: Diagnosis not present

## 2012-05-02 DIAGNOSIS — E559 Vitamin D deficiency, unspecified: Secondary | ICD-10-CM | POA: Diagnosis not present

## 2012-05-02 DIAGNOSIS — I10 Essential (primary) hypertension: Secondary | ICD-10-CM | POA: Diagnosis not present

## 2012-05-08 DIAGNOSIS — M949 Disorder of cartilage, unspecified: Secondary | ICD-10-CM | POA: Diagnosis not present

## 2012-05-08 DIAGNOSIS — M899 Disorder of bone, unspecified: Secondary | ICD-10-CM | POA: Diagnosis not present

## 2012-05-08 DIAGNOSIS — Z1231 Encounter for screening mammogram for malignant neoplasm of breast: Secondary | ICD-10-CM | POA: Diagnosis not present

## 2012-05-19 ENCOUNTER — Ambulatory Visit (INDEPENDENT_AMBULATORY_CARE_PROVIDER_SITE_OTHER): Payer: Medicare Other

## 2012-05-19 DIAGNOSIS — I4891 Unspecified atrial fibrillation: Secondary | ICD-10-CM | POA: Diagnosis not present

## 2012-05-23 DIAGNOSIS — F5102 Adjustment insomnia: Secondary | ICD-10-CM | POA: Diagnosis not present

## 2012-05-23 DIAGNOSIS — K649 Unspecified hemorrhoids: Secondary | ICD-10-CM | POA: Diagnosis not present

## 2012-05-23 DIAGNOSIS — M810 Age-related osteoporosis without current pathological fracture: Secondary | ICD-10-CM | POA: Diagnosis not present

## 2012-06-16 ENCOUNTER — Ambulatory Visit (INDEPENDENT_AMBULATORY_CARE_PROVIDER_SITE_OTHER): Payer: Medicare Other | Admitting: *Deleted

## 2012-06-16 DIAGNOSIS — I4891 Unspecified atrial fibrillation: Secondary | ICD-10-CM

## 2012-07-14 ENCOUNTER — Other Ambulatory Visit: Payer: Self-pay | Admitting: Endocrinology

## 2012-07-14 DIAGNOSIS — I10 Essential (primary) hypertension: Secondary | ICD-10-CM | POA: Diagnosis not present

## 2012-07-14 DIAGNOSIS — M818 Other osteoporosis without current pathological fracture: Secondary | ICD-10-CM | POA: Diagnosis not present

## 2012-07-14 DIAGNOSIS — E039 Hypothyroidism, unspecified: Secondary | ICD-10-CM | POA: Diagnosis not present

## 2012-07-14 DIAGNOSIS — E041 Nontoxic single thyroid nodule: Secondary | ICD-10-CM | POA: Diagnosis not present

## 2012-07-14 DIAGNOSIS — E049 Nontoxic goiter, unspecified: Secondary | ICD-10-CM

## 2012-07-16 ENCOUNTER — Ambulatory Visit
Admission: RE | Admit: 2012-07-16 | Discharge: 2012-07-16 | Disposition: A | Discharge status: Home / Self Care | Payer: Medicare Other | Source: Ambulatory Visit | Attending: Endocrinology | Admitting: Endocrinology

## 2012-07-16 DIAGNOSIS — E049 Nontoxic goiter, unspecified: Secondary | ICD-10-CM

## 2012-07-16 DIAGNOSIS — E042 Nontoxic multinodular goiter: Secondary | ICD-10-CM | POA: Diagnosis not present

## 2012-07-21 ENCOUNTER — Other Ambulatory Visit: Payer: Self-pay | Admitting: *Deleted

## 2012-07-21 MED ORDER — WARFARIN SODIUM 5 MG PO TABS: 5.0000 mg | ORAL_TABLET | ORAL | Status: DC

## 2012-07-25 ENCOUNTER — Other Ambulatory Visit: Payer: Self-pay

## 2012-07-25 ENCOUNTER — Other Ambulatory Visit: Payer: Self-pay | Admitting: *Deleted

## 2012-07-25 MED ORDER — POTASSIUM CHLORIDE ER 10 MEQ PO TBCR: 10.0000 meq | EXTENDED_RELEASE_TABLET | Freq: Two times a day (BID) | ORAL | Status: DC

## 2012-07-25 MED ORDER — WARFARIN SODIUM 5 MG PO TABS: ORAL_TABLET | ORAL | Status: DC

## 2012-07-28 ENCOUNTER — Ambulatory Visit (INDEPENDENT_AMBULATORY_CARE_PROVIDER_SITE_OTHER): Payer: Medicare Other | Admitting: *Deleted

## 2012-07-28 DIAGNOSIS — I4891 Unspecified atrial fibrillation: Secondary | ICD-10-CM

## 2012-07-28 LAB — POCT INR: INR: 2.6

## 2012-07-29 ENCOUNTER — Other Ambulatory Visit: Payer: Self-pay | Admitting: Cardiology

## 2012-07-29 MED ORDER — POTASSIUM CHLORIDE ER 10 MEQ PO TBCR: 10.0000 meq | EXTENDED_RELEASE_TABLET | Freq: Two times a day (BID) | ORAL | Status: DC

## 2012-09-02 DIAGNOSIS — M159 Polyosteoarthritis, unspecified: Secondary | ICD-10-CM | POA: Diagnosis not present

## 2012-09-02 DIAGNOSIS — M810 Age-related osteoporosis without current pathological fracture: Secondary | ICD-10-CM | POA: Diagnosis not present

## 2012-09-02 DIAGNOSIS — F5102 Adjustment insomnia: Secondary | ICD-10-CM | POA: Diagnosis not present

## 2012-09-02 DIAGNOSIS — K649 Unspecified hemorrhoids: Secondary | ICD-10-CM | POA: Diagnosis not present

## 2012-09-08 ENCOUNTER — Ambulatory Visit (INDEPENDENT_AMBULATORY_CARE_PROVIDER_SITE_OTHER): Payer: Medicare Other | Admitting: Pharmacist

## 2012-09-08 DIAGNOSIS — I4891 Unspecified atrial fibrillation: Secondary | ICD-10-CM | POA: Diagnosis not present

## 2012-09-08 LAB — POCT INR: INR: 2.6

## 2012-09-11 ENCOUNTER — Other Ambulatory Visit: Payer: Self-pay | Admitting: Cardiology

## 2012-09-11 NOTE — Telephone Encounter (Signed)
Refilled potassium for #30 needs appointment

## 2012-10-03 ENCOUNTER — Encounter: Payer: Self-pay | Admitting: Nurse Practitioner

## 2012-10-03 ENCOUNTER — Telehealth: Payer: Self-pay | Admitting: *Deleted

## 2012-10-03 ENCOUNTER — Ambulatory Visit (INDEPENDENT_AMBULATORY_CARE_PROVIDER_SITE_OTHER): Payer: Medicare Other | Admitting: Nurse Practitioner

## 2012-10-03 VITALS — BP 170/80 | HR 57 | Ht 66.0 in | Wt 124.0 lb

## 2012-10-03 DIAGNOSIS — I10 Essential (primary) hypertension: Secondary | ICD-10-CM

## 2012-10-03 DIAGNOSIS — I119 Hypertensive heart disease without heart failure: Secondary | ICD-10-CM

## 2012-10-03 LAB — BASIC METABOLIC PANEL
BUN: 30 mg/dL — ABNORMAL HIGH (ref 6–23)
CO2: 29 mEq/L (ref 19–32)
Calcium: 10.3 mg/dL (ref 8.4–10.5)
Chloride: 99 mEq/L (ref 96–112)
Creatinine, Ser: 1.7 mg/dL — ABNORMAL HIGH (ref 0.4–1.2)
GFR: 32.2 mL/min — ABNORMAL LOW (ref >60.00)
Glucose, Bld: 84 mg/dL (ref 70–99)
Potassium: 3.5 mEq/L (ref 3.5–5.1)
Sodium: 135 mEq/L (ref 135–145)

## 2012-10-03 MED ORDER — LISINOPRIL 5 MG PO TABS: 5.0000 mg | ORAL_TABLET | Freq: Every day | ORAL | Status: DC

## 2012-10-03 NOTE — Telephone Encounter (Signed)
Message copied by Barrie Folk on Fri Oct 03, 2012  4:01 PM ------      Message from: Rosalio Macadamia      Created: Fri Oct 03, 2012  3:12 PM       Please call. She is going to need a repeat BMET early next week. I started her on ACE. This may not be a good choice but will follow closely.

## 2012-10-03 NOTE — Telephone Encounter (Signed)
I spoke with pt and she is aware of lab results.  will come in on 10/07/12 for repeat BMP. Mylo Red RN

## 2012-10-03 NOTE — Progress Notes (Addendum)
Lisa Lucero Date of Birth: 25-Apr-1935 Medical Record #[id number]  History of Present Illness: Lisa Lucero is seen back today for a follow up visit. She is seen for Dr. Patty Sermons. She is a 77 year old female with PAF. She remains on coumadin. Has had prior stroke in July of 2011. She has had prior GI bleeding in October of 2011 and is on long term PPI therapy along with her Warfarin. Her other issues include HTN, HLD, hypothyroidism and bladder issues.   She comes in today. She is here alone. She says she is doing well. Does not feel bad. No chest pain. Not short of breath. Not lightheaded or dizzy. No syncope. Not sure how her rhythm has been doing. She has cut her HCTZ back due to her bladder issues. BP is grossly elevated. Has not been checking at home. No headaches. No visual changes. She does bruise easily.   Current Outpatient Prescriptions on File Prior to Visit  Medication Sig Dispense Refill  . acetaminophen (TYLENOL) 500 MG tablet Take 500 mg by mouth 2 (two) times daily.       Marland Kitchen CALCIUM PO Take 1,000 mg by mouth daily.      Marland Kitchen diltiazem (DILACOR XR) 180 MG 24 hr capsule Take 180 mg by mouth daily.        . fenofibrate micronized (LOFIBRA) 134 MG capsule       . hydrochlorothiazide (HYDRODIURIL) 25 MG tablet Take 12.5 mg by mouth daily.       Marland Kitchen ipratropium (ATROVENT) 0.06 % nasal spray       . levothyroxine (SYNTHROID, LEVOTHROID) 50 MCG tablet Take 50 mcg by mouth daily.        . metoprolol (LOPRESSOR) 50 MG tablet Take 50 mg by mouth 2 (two) times daily.        . Multiple Vitamin (MULITIVITAMIN WITH MINERALS) TABS Take 1 tablet by mouth daily.      . pantoprazole (PROTONIX) 40 MG tablet Take 40 mg by mouth daily.       Marland Kitchen PREMARIN vaginal cream Once a week.      . traMADol (ULTRAM) 50 MG tablet Take 50 mg by mouth 2 (two) times daily.       Marland Kitchen warfarin (COUMADIN) 5 MG tablet Take as Directed per Anticoagulation Clinic  90 tablet  1  . zolpidem (AMBIEN CR) 6.25 MG CR tablet         . DISCONTD: diphenhydrAMINE (BENADRYL) 25 MG tablet Take 25 mg by mouth every 6 (six) hours as needed.        Marland Kitchen DISCONTD: IRON PO Take 1 tablet by mouth daily.       Marland Kitchen DISCONTD: potassium chloride (K-DUR) 10 MEQ tablet Take 1 tablet (10 mEq total) by mouth 2 (two) times daily.  90 tablet  0    Allergies  Allergen Reactions  . Lipitor (Atorvastatin Calcium)     myalgias    Past Medical History  Diagnosis Date  . Paroxysmal atrial fibrillation   . History of embolic stroke 06/2010  . Erosive gastritis     with GI bleed thought due to elevated INR and duodenitis  . Hyperthyroidism   . Peripheral edema   . Long-term (current) use of anticoagulants   . HTN (hypertension)   . COPD (chronic obstructive pulmonary disease)   . Stroke   . GERD (gastroesophageal reflux disease)     Past Surgical History  Procedure Date  . Hemorrhoid surgery   . Operative hysteroscopy   .  Abdominal hysterectomy   . Cholecystectomy 03/11/2012    Procedure: LAPAROSCOPIC CHOLECYSTECTOMY WITH INTRAOPERATIVE CHOLANGIOGRAM;  Surgeon: Clovis Pu. Cornett, MD;  Location: [facility] OR;  Service: General;  Laterality: N/A;    History  Smoking status  . Former Smoker  . Types: Cigarettes  . Quit date: 09/30/2010  Smokeless tobacco  . Never Used    History  Alcohol Use  . 0.6 oz/week  . 1 Glasses of wine per week    occassional    Family History  Problem Relation Age of Onset  . Stroke Mother   . Dementia Father   . Hypertension    . Arthritis    . Cancer Brother     lung    Review of Systems: The review of systems is per the HPI.  All other systems were reviewed and are negative.  Physical Exam: BP 178/104  Pulse 57  Ht 5\' 6"  (1.676 m)  Wt 124 lb (56.246 kg)  BMI 20.01 kg/m2 Patient is very pleasant and in no acute distress. Skin is warm and dry. Color is normal.  HEENT is unremarkable. Normocephalic/atraumatic. PERRL. Sclera are nonicteric. Neck is supple. No masses. No JVD. Lungs are clear.  Cardiac exam shows a regular rate and rhythm. Abdomen is soft. Extremities are without edema. Gait and ROM are intact. No gross neurologic deficits noted.   LABORATORY DATA: EKG today shows sinus rhythm. She has anterior T wave changes.    Lab Results  Component Value Date   WBC 6.3 03/13/2012   HGB 11.4* 03/13/2012   HCT 33.0* 03/13/2012   PLT 261 03/13/2012   GLUCOSE 83 03/12/2012   CHOL  Value: 131        ATP III CLASSIFICATION:  <200     mg/dL   Desirable  478-295  mg/dL   Borderline High  >=621    mg/dL   High        03/07/6577   TRIG 68 07/11/2010   HDL 45 07/11/2010   LDLCALC  Value: 72        Total Cholesterol/HDL:CHD Risk Coronary Heart Disease Risk Table                     Men   Women  1/2 Average Risk   3.4   3.3  Average Risk       5.0   4.4  2 X Average Risk   9.6   7.1  3 X Average Risk  23.4   11.0        Use the calculated Patient Ratio above and the CHD Risk Table to determine the patient's CHD Risk.        ATP III CLASSIFICATION (LDL):  <100     mg/dL   Optimal  469-629  mg/dL   Near or Above                    Optimal  130-159  mg/dL   Borderline  528-413  mg/dL   High  >244     mg/dL   Very High 0/09/2724   ALT 16 03/11/2012   AST 25 03/11/2012   NA 136 03/12/2012   K 3.1* 03/12/2012   CL 103 03/12/2012   CREATININE 0.72 03/12/2012   BUN 10 03/12/2012   CO2 23 03/12/2012   TSH 1.244 10/24/2010   INR 2.6 09/08/2012   HGBA1C  Value: 5.8 (NOTE)  According to the ADA Clinical Practice Recommendations for 2011, when HbA1c is used as a screening test:   >=6.5%   Diagnostic of Diabetes Mellitus           (if abnormal result  is confirmed)  5.7-6.4%   Increased risk of developing Diabetes Mellitus  References:Diagnosis and Classification of Diabetes Mellitus,Diabetes Care,2011,34(Suppl 1):S62-S69 and Standards of Medical Care in         Diabetes - 2011,Diabetes Care,2011,34  (Suppl 1):S11-S61.* 07/10/2010    Assessment /  Plan: 1. HTN - blood pressure is up. She admits to increased salt use. She is not really wanting to increase her HCTZ. Will add low dose ACE with Lisinopril 5 mg a day. May be able to stop her potassium in the future. We will check a BMET today. I will see her back in 2 weeks and I have encouraged her to check some readings at home.   2. PAF - remains in sinus and is on chronic anticoagulation.  3. Abnormal EKG - she does have anterior T wave changes. I do not see an old EKG to compare. Will request her paper chart.   Patient is agreeable to this plan and will call if any problems develop in the interim.   Addendum: Old EKG from May of 2011 also shows anterior T wave inversion and appears unchanged.

## 2012-10-03 NOTE — Patient Instructions (Addendum)
Try to cut back on the salt  We are adding Lisinopril 5 mg a day. Take the first one tonight and then one each day  Monitor your blood pressure at home and record some of the readings. Bring your cuff in at your next visit  I will see you in about 2 weeks with repeat labs  Call the [facility] office at [phone] if you have any questions, problems or concerns.

## 2012-10-06 ENCOUNTER — Encounter: Payer: Self-pay | Admitting: Cardiology

## 2012-10-07 ENCOUNTER — Other Ambulatory Visit (INDEPENDENT_AMBULATORY_CARE_PROVIDER_SITE_OTHER): Payer: Medicare Other

## 2012-10-07 DIAGNOSIS — I119 Hypertensive heart disease without heart failure: Secondary | ICD-10-CM | POA: Diagnosis not present

## 2012-10-07 LAB — BASIC METABOLIC PANEL
BUN: 25 mg/dL — ABNORMAL HIGH (ref 6–23)
CO2: 30 mEq/L (ref 19–32)
Calcium: 9.9 mg/dL (ref 8.4–10.5)
Chloride: 98 mEq/L (ref 96–112)
Creatinine, Ser: 1.5 mg/dL — ABNORMAL HIGH (ref 0.4–1.2)
GFR: 36.23 mL/min — ABNORMAL LOW (ref >60.00)
Glucose, Bld: 69 mg/dL — ABNORMAL LOW (ref 70–99)
Potassium: 3.7 mEq/L (ref 3.5–5.1)
Sodium: 134 mEq/L — ABNORMAL LOW (ref 135–145)

## 2012-10-08 ENCOUNTER — Encounter: Payer: Self-pay | Admitting: Nurse Practitioner

## 2012-10-09 ENCOUNTER — Telehealth: Payer: Self-pay | Admitting: *Deleted

## 2012-10-09 DIAGNOSIS — N3080 Other cystitis without hematuria: Secondary | ICD-10-CM | POA: Diagnosis not present

## 2012-10-09 DIAGNOSIS — N390 Urinary tract infection, site not specified: Secondary | ICD-10-CM | POA: Diagnosis not present

## 2012-10-09 NOTE — Telephone Encounter (Signed)
Message copied by Awilda Bill on Thu Oct 09, 2012  2:53 PM ------      Message from: Rosalio Macadamia      Created: Tue Oct 07, 2012  2:58 PM       Ok to report. Renal function is ok. Will need to recheck on return visit.

## 2012-10-09 NOTE — Telephone Encounter (Signed)
Pt informed of lab results and will have repeat BMET on return visit.  Vista Mink, CMA

## 2012-10-16 ENCOUNTER — Ambulatory Visit (INDEPENDENT_AMBULATORY_CARE_PROVIDER_SITE_OTHER): Payer: Medicare Other | Admitting: Nurse Practitioner

## 2012-10-16 ENCOUNTER — Encounter: Payer: Self-pay | Admitting: Nurse Practitioner

## 2012-10-16 VITALS — BP 150/96 | HR 60 | Ht 66.0 in | Wt 124.8 lb

## 2012-10-16 DIAGNOSIS — I10 Essential (primary) hypertension: Secondary | ICD-10-CM

## 2012-10-16 LAB — BASIC METABOLIC PANEL
BUN: 30 mg/dL — ABNORMAL HIGH (ref 6–23)
CO2: 31 mEq/L (ref 19–32)
Calcium: 9.9 mg/dL (ref 8.4–10.5)
Chloride: 96 mEq/L (ref 96–112)
Creatinine, Ser: 1.6 mg/dL — ABNORMAL HIGH (ref 0.4–1.2)
GFR: 33.61 mL/min — ABNORMAL LOW (ref >60.00)
Glucose, Bld: 81 mg/dL (ref 70–99)
Potassium: 4 mEq/L (ref 3.5–5.1)
Sodium: 134 mEq/L — ABNORMAL LOW (ref 135–145)

## 2012-10-16 NOTE — Progress Notes (Signed)
Lisa Lucero Date of Birth: July 31, 1935 Medical Record #[id number]  History of Present Illness: Ms. Strohman is seen today for a follow up visit. She is seen for Dr. Patty Sermons. She has PAF and has been on chronic coumadin. She had a stroke back in 2011. She has had prior GI bleeding in 2011 and is maintained on chronic PPI therapy. Her other issues include HTN, HLD, hypothyroidism and bladder issues.   I saw her 2 weeks ago. BP was up. We started low dose ACE. She admits to excessive salt and likes "potato chips".  She comes back today. She brought her cuff with her today. It correlates nicely. Her readings from home for the most part are very satisfactory. She really sees no change with her medicines. She feels fine. No chest pain. Not short of breath. Not dizzy or lightheaded. She has had some mild renal insufficiency and we will recheck today.   Current Outpatient Prescriptions on File Prior to Visit  Medication Sig Dispense Refill  . acetaminophen (TYLENOL) 500 MG tablet Take 500 mg by mouth 2 (two) times daily.       Marland Kitchen CALCIUM PO Take 1,000 mg by mouth daily.      Marland Kitchen diltiazem (DILACOR XR) 180 MG 24 hr capsule Take 180 mg by mouth daily.        . fenofibrate micronized (LOFIBRA) 134 MG capsule Take 134 mg by mouth daily before breakfast.       . hydrochlorothiazide (HYDRODIURIL) 25 MG tablet Take 12.5 mg by mouth daily.       . hydrocortisone (ANUSOL-HC) 25 MG suppository Place 25 mg rectally as needed.       Marland Kitchen ipratropium (ATROVENT) 0.06 % nasal spray Place 2 sprays into the nose daily.       Marland Kitchen levothyroxine (SYNTHROID, LEVOTHROID) 50 MCG tablet Take 50 mcg by mouth daily.        Marland Kitchen lisinopril (PRINIVIL,ZESTRIL) 5 MG tablet Take 1 tablet (5 mg total) by mouth daily.  30 tablet  6  . metoprolol (LOPRESSOR) 50 MG tablet Take 50 mg by mouth 2 (two) times daily.        . Multiple Vitamin (MULITIVITAMIN WITH MINERALS) TABS Take 1 tablet by mouth daily.      . pantoprazole (PROTONIX) 40 MG  tablet Take 40 mg by mouth daily.       Marland Kitchen PREMARIN vaginal cream Once a week.      . traMADol (ULTRAM) 50 MG tablet Take 50 mg by mouth 2 (two) times daily.       Marland Kitchen warfarin (COUMADIN) 5 MG tablet Take as Directed per Anticoagulation Clinic  90 tablet  1  . zolpidem (AMBIEN CR) 6.25 MG CR tablet Take 6.25 mg by mouth at bedtime as needed.       Marland Kitchen DISCONTD: diphenhydrAMINE (BENADRYL) 25 MG tablet Take 25 mg by mouth every 6 (six) hours as needed.        Marland Kitchen DISCONTD: IRON PO Take 1 tablet by mouth daily.       Marland Kitchen DISCONTD: potassium chloride (K-DUR) 10 MEQ tablet Take 1 tablet (10 mEq total) by mouth 2 (two) times daily.  90 tablet  0    Allergies  Allergen Reactions  . Lipitor (Atorvastatin Calcium)     myalgias    Past Medical History  Diagnosis Date  . Paroxysmal atrial fibrillation   . History of embolic stroke 06/2010  . Erosive gastritis     with GI bleed thought due  to elevated INR and duodenitis  . Hyperthyroidism   . Peripheral edema   . Long-term (current) use of anticoagulants   . HTN (hypertension)   . COPD (chronic obstructive pulmonary disease)   . Stroke   . GERD (gastroesophageal reflux disease)     Past Surgical History  Procedure Date  . Hemorrhoid surgery   . Operative hysteroscopy   . Abdominal hysterectomy   . Cholecystectomy 03/11/2012    Procedure: LAPAROSCOPIC CHOLECYSTECTOMY WITH INTRAOPERATIVE CHOLANGIOGRAM;  Surgeon: Clovis Pu. Cornett, MD;  Location: [facility] OR;  Service: General;  Laterality: N/A;    History  Smoking status  . Former Smoker  . Types: Cigarettes  . Quit date: 09/30/2010  Smokeless tobacco  . Never Used    History  Alcohol Use  . 0.6 oz/week  . 1 Glasses of wine per week    occassional    Family History  Problem Relation Age of Onset  . Stroke Mother   . Dementia Father   . Hypertension    . Arthritis    . Cancer Brother     lung    Review of Systems: The review of systems is per the HPI.  All other systems were  reviewed and are negative.  Physical Exam: BP 150/96  Pulse 60  Ht 5\' 6"  (1.676 m)  Wt 124 lb 12.8 oz (56.609 kg)  BMI 20.14 kg/m2 Patient is very pleasant and in no acute distress. Skin is warm and dry. Color is normal.  HEENT is unremarkable. Normocephalic/atraumatic. PERRL. Sclera are nonicteric. Neck is supple. No masses. No JVD. Lungs are clear. Cardiac exam shows a regular rate and rhythm. Abdomen is soft. Extremities are without edema. Gait and ROM are intact. No gross neurologic deficits noted.  LABORATORY DATA: BMET today is pending  Lab Results  Component Value Date   WBC 6.3 03/13/2012   HGB 11.4* 03/13/2012   HCT 33.0* 03/13/2012   PLT 261 03/13/2012   GLUCOSE 69* 10/07/2012   CHOL  Value: 131        ATP III CLASSIFICATION:  <200     mg/dL   Desirable  161-096  mg/dL   Borderline High  >=045    mg/dL   High        04/08/8118   TRIG 68 07/11/2010   HDL 45 07/11/2010   LDLCALC  Value: 72        Total Cholesterol/HDL:CHD Risk Coronary Heart Disease Risk Table                     Men   Women  1/2 Average Risk   3.4   3.3  Average Risk       5.0   4.4  2 X Average Risk   9.6   7.1  3 X Average Risk  23.4   11.0        Use the calculated Patient Ratio above and the CHD Risk Table to determine the patient's CHD Risk.        ATP III CLASSIFICATION (LDL):  <100     mg/dL   Optimal  147-829  mg/dL   Near or Above                    Optimal  130-159  mg/dL   Borderline  562-130  mg/dL   High  >865     mg/dL   Very High 7/84/6962   ALT 16 03/11/2012   AST 25 03/11/2012  NA 134* 10/07/2012   K 3.7 10/07/2012   CL 98 10/07/2012   CREATININE 1.5* 10/07/2012   BUN 25* 10/07/2012   CO2 30 10/07/2012   TSH 1.244 10/24/2010   INR 2.6 09/08/2012   HGBA1C  Value: 5.8 (NOTE)                                                                       According to the ADA Clinical Practice Recommendations for 2011, when HbA1c is used as a screening test:   >=6.5%   Diagnostic of Diabetes Mellitus           (if  abnormal result  is confirmed)  5.7-6.4%   Increased risk of developing Diabetes Mellitus  References:Diagnosis and Classification of Diabetes Mellitus,Diabetes Care,2011,34(Suppl 1):S62-S69 and Standards of Medical Care in         Diabetes - 2011,Diabetes Care,2011,34  (Suppl 1):S11-S61.* 07/10/2010   Assessment / Plan: 1. HTN - her readings from home are pretty satisfactory for the most part. She sees no difference with the medicine. She will continue to monitor at home. Her cuff does correlate nicely. She may use the Lisinopril for BP consistently staying over 135/85. Salt restriction is encouraged.   2. CRI - will recheck BMET today  3. PAF - in sinus on exam today.   Patient is agreeable to this plan and will call if any problems develop in the interim.

## 2012-10-16 NOTE — Patient Instructions (Addendum)
Ok to not take the Lisinopril if your blood pressure is staying below 135/85  Continue to monitor your blood pressure at home. Always bring a record of your readings to the doctor's office  We will check lab today  Continue to minimize your salt intake  Call the [facility] office at [phone] if you have any questions, problems or concerns.

## 2012-10-20 ENCOUNTER — Ambulatory Visit (INDEPENDENT_AMBULATORY_CARE_PROVIDER_SITE_OTHER): Payer: Medicare Other

## 2012-10-20 DIAGNOSIS — I4891 Unspecified atrial fibrillation: Secondary | ICD-10-CM

## 2012-10-21 ENCOUNTER — Encounter: Payer: Self-pay | Admitting: *Deleted

## 2012-10-28 DIAGNOSIS — Z23 Encounter for immunization: Secondary | ICD-10-CM | POA: Diagnosis not present

## 2012-10-28 DIAGNOSIS — N3080 Other cystitis without hematuria: Secondary | ICD-10-CM | POA: Diagnosis not present

## 2012-10-28 DIAGNOSIS — Z79899 Other long term (current) drug therapy: Secondary | ICD-10-CM | POA: Diagnosis not present

## 2012-10-28 DIAGNOSIS — E785 Hyperlipidemia, unspecified: Secondary | ICD-10-CM | POA: Diagnosis not present

## 2012-10-28 DIAGNOSIS — Z0000 Encounter for general adult medical examination without abnormal findings: Secondary | ICD-10-CM | POA: Diagnosis not present

## 2012-12-01 ENCOUNTER — Ambulatory Visit (INDEPENDENT_AMBULATORY_CARE_PROVIDER_SITE_OTHER): Payer: Medicare Other | Admitting: Pharmacist

## 2012-12-01 DIAGNOSIS — I4891 Unspecified atrial fibrillation: Secondary | ICD-10-CM

## 2012-12-16 ENCOUNTER — Encounter: Payer: Self-pay | Admitting: Cardiology

## 2012-12-16 ENCOUNTER — Ambulatory Visit (INDEPENDENT_AMBULATORY_CARE_PROVIDER_SITE_OTHER): Payer: Medicare Other | Admitting: Cardiology

## 2012-12-16 VITALS — BP 157/92 | HR 61 | Ht 66.0 in | Wt 129.0 lb

## 2012-12-16 DIAGNOSIS — E876 Hypokalemia: Secondary | ICD-10-CM | POA: Diagnosis not present

## 2012-12-16 DIAGNOSIS — I4891 Unspecified atrial fibrillation: Secondary | ICD-10-CM | POA: Diagnosis not present

## 2012-12-16 DIAGNOSIS — I480 Paroxysmal atrial fibrillation: Secondary | ICD-10-CM

## 2012-12-16 DIAGNOSIS — I10 Essential (primary) hypertension: Secondary | ICD-10-CM | POA: Diagnosis not present

## 2012-12-16 DIAGNOSIS — I119 Hypertensive heart disease without heart failure: Secondary | ICD-10-CM

## 2012-12-16 LAB — BASIC METABOLIC PANEL
BUN: 27 mg/dL — ABNORMAL HIGH (ref 6–23)
CO2: 29 mEq/L (ref 19–32)
Chloride: 98 mEq/L (ref 96–112)
Creatinine, Ser: 1.3 mg/dL — ABNORMAL HIGH (ref 0.4–1.2)

## 2012-12-16 MED ORDER — LISINOPRIL 10 MG PO TABS: 10.0000 mg | ORAL_TABLET | Freq: Every day | ORAL | Status: DC

## 2012-12-16 NOTE — Progress Notes (Signed)
Lisa Lucero Date of Birth:  Nov 23, 1935 [facility] [address] [place], [place]  [address] [phone]         Fax   [phone]  History of Present Illness: This pleasant 77 year old woman is seen back for a scheduled followup office visit. She has a past history of paroxysmal atrial fibrillation. She was last hospitalized for atrial fibrillation in March of 2010. She is on long-term Coumadin. After she had gone off Coumadin and was maintaining normal sinus rhythm she was hospitalized with an embolic stroke and was placed back on long-term Coumadin. The Coumadin had to be held in October 2011 when she was admitted to [facility] with a GI bleed secondary to erosive duodenitis. She's had no further problems with her stomach since being placed on long-term proton pump inhibitor.  She has been checking her pulse and blood pressure on her home blood pressure monitor.  Blood pressure has been ranging high in the 160-170 range most of the time.  Current Outpatient Prescriptions  Medication Sig Dispense Refill  . acetaminophen (TYLENOL) 500 MG tablet Take 500 mg by mouth 2 (two) times daily.       Marland Kitchen CALCIUM PO Take 1,000 mg by mouth daily.      Marland Kitchen diltiazem (DILACOR XR) 180 MG 24 hr capsule Take 180 mg by mouth daily.        . fenofibrate micronized (LOFIBRA) 134 MG capsule Take 134 mg by mouth daily before breakfast.       . hydrochlorothiazide (HYDRODIURIL) 25 MG tablet Take 12.5 mg by mouth daily.       . hydrocortisone (ANUSOL-HC) 25 MG suppository Place 25 mg rectally as needed.       Marland Kitchen ipratropium (ATROVENT) 0.06 % nasal spray Place 2 sprays into the nose daily.       Marland Kitchen levothyroxine (SYNTHROID, LEVOTHROID) 50 MCG tablet Take 50 mcg by mouth daily.        Marland Kitchen lisinopril (PRINIVIL,ZESTRIL) 10 MG tablet Take 1 tablet (10 mg total) by mouth daily.  30 tablet  6  . metoprolol (LOPRESSOR) 50 MG tablet Take 50 mg by mouth 2 (two) times daily.        . Multiple Vitamin  (MULITIVITAMIN WITH MINERALS) TABS Take 1 tablet by mouth daily.      . pantoprazole (PROTONIX) 40 MG tablet Take 40 mg by mouth daily.       Marland Kitchen PREMARIN vaginal cream Once a week.      . traMADol (ULTRAM) 50 MG tablet Take 50 mg by mouth 2 (two) times daily.       Marland Kitchen warfarin (COUMADIN) 5 MG tablet Take as Directed per Anticoagulation Clinic  90 tablet  1  . zolpidem (AMBIEN CR) 6.25 MG CR tablet Take 6.25 mg by mouth at bedtime as needed.       . [DISCONTINUED] diphenhydrAMINE (BENADRYL) 25 MG tablet Take 25 mg by mouth every 6 (six) hours as needed.        . [DISCONTINUED] IRON PO Take 1 tablet by mouth daily.       . [DISCONTINUED] potassium chloride (K-DUR) 10 MEQ tablet Take 1 tablet (10 mEq total) by mouth 2 (two) times daily.  90 tablet  0    Allergies  Allergen Reactions  . Lipitor (Atorvastatin Calcium)     myalgias    Patient Active Problem List  Diagnosis  . Atrial fibrillation  . History of embolic stroke  . Hyperthyroidism  . Long-term (current) use  of anticoagulants  . Benign hypertensive heart disease without heart failure  . Dyslipidemia  . Gallstone  . Hemorrhagic cystitis  . Leukocytosis  . Hyponatremia  . Hypochloremia  . Hypokalemia    History  Smoking status  . Former Smoker  . Types: Cigarettes  . Quit date: 09/30/2010  Smokeless tobacco  . Never Used    History  Alcohol Use  . 0.6 oz/week  . 1 Glasses of wine per week    Comment: occassional    Family History  Problem Relation Age of Onset  . Stroke Mother   . Dementia Father   . Hypertension    . Arthritis    . Cancer Brother     lung    Review of Systems: Constitutional: no fever chills diaphoresis or fatigue or change in weight.  Head and neck: no hearing loss, no epistaxis, no photophobia or visual disturbance. Respiratory: No cough, shortness of breath or wheezing. Cardiovascular: No chest pain peripheral edema, palpitations. Gastrointestinal: No abdominal distention, no  abdominal pain, no change in bowel habits hematochezia or melena. Genitourinary: No dysuria, no frequency, no urgency, no nocturia. Musculoskeletal:No arthralgias, no back pain, no gait disturbance or myalgias. Neurological: No dizziness, no headaches, no numbness, no seizures, no syncope, no weakness, no tremors. Hematologic: No lymphadenopathy, no easy bruising. Psychiatric: No confusion, no hallucinations, no sleep disturbance.    Physical Exam: Filed Vitals:   12/16/12 1049  BP: 157/92  Pulse: 61   the general appearance reveals a well-developed well-nourished woman in no distress.The head and neck exam reveals pupils equal and reactive.  Extraocular movements are full.  There is no scleral icterus.  The mouth and pharynx are normal.  The neck is supple.  The carotids reveal no bruits.  The jugular venous pressure is normal.  The  thyroid is not enlarged.  There is no lymphadenopathy.  The chest is clear to percussion and auscultation.  There are no rales or rhonchi.  Expansion of the chest is symmetrical.  The precordium is quiet.  The first heart sound is normal.  The second heart sound is physiologically split.  There is no murmur gallop rub or click.  There is no abnormal lift or heave.  The abdomen is soft and nontender.  The bowel sounds are normal.  The liver and spleen are not enlarged.  There are no abdominal masses.  There are no abdominal bruits.  Extremities reveal good pedal pulses.  There is no phlebitis or edema.  There is no cyanosis or clubbing.  Strength is normal and symmetrical in all extremities.  There is no lateralizing weakness.  There are no sensory deficits.  The skin is warm and dry.  There is no rash.     Assessment / Plan: Continue same medication except increase lisinopril up to 10 mg daily. Recheck in 4 months for followup office visit EKG and basal metabolic panel.

## 2012-12-16 NOTE — Assessment & Plan Note (Signed)
The patient has not been aware of any recurrent atrial fibrillation.  She has had no further TIA or stroke symptoms.  She remains on long-term Coumadin

## 2012-12-16 NOTE — Progress Notes (Signed)
Quick Note:  Please report to patient. The recent labs are stable. Continue same medication and careful diet. Continue potassium. ______

## 2012-12-16 NOTE — Assessment & Plan Note (Signed)
Her systolic blood pressure has been running high in the range of 160-170 at home most of the time.  At her last visit we've correlated her machine with orders and her machine is accurate.  She has been on lisinopril 5 mg daily and we will increase that to10 mg daily.

## 2012-12-16 NOTE — Patient Instructions (Signed)
INCREASE YOUR LISINOPRIL TO 10 MG DAILY  Will obtain labs today and call you with the results (bmet)  Your physician wants you to follow-up in: 4 month ov/ekg You will receive a reminder letter in the mail two months in advance. If you don't receive a letter, please call our office to schedule the follow-up appointment.

## 2012-12-16 NOTE — Assessment & Plan Note (Signed)
The patient has a past history of hypokalemia.  This should be better now that she is on an ACE inhibitor.  We are checking a basal metabolic panel today.  She has been taking daily lisinopril and daily potassium

## 2012-12-23 ENCOUNTER — Telehealth: Payer: Self-pay | Admitting: *Deleted

## 2012-12-23 NOTE — Telephone Encounter (Signed)
Message copied by Burnell Blanks on Tue Dec 23, 2012  8:48 AM ------      Message from: Cassell Clement      Created: Tue Dec 16, 2012  9:31 PM       Please report to patient.  The recent labs are stable. Continue same medication and careful diet. Continue potassium.

## 2012-12-23 NOTE — Telephone Encounter (Signed)
Mailed copy of labs and left message to call if any questions  

## 2012-12-30 ENCOUNTER — Other Ambulatory Visit: Payer: Self-pay

## 2012-12-30 MED ORDER — POTASSIUM CHLORIDE CRYS ER 10 MEQ PO TBCR: 10.0000 meq | EXTENDED_RELEASE_TABLET | Freq: Two times a day (BID) | ORAL | Status: DC

## 2013-01-12 ENCOUNTER — Ambulatory Visit (INDEPENDENT_AMBULATORY_CARE_PROVIDER_SITE_OTHER): Payer: Medicare Other | Admitting: Pharmacist

## 2013-01-12 DIAGNOSIS — I4891 Unspecified atrial fibrillation: Secondary | ICD-10-CM | POA: Diagnosis not present

## 2013-01-26 ENCOUNTER — Ambulatory Visit (INDEPENDENT_AMBULATORY_CARE_PROVIDER_SITE_OTHER): Payer: Medicare Other

## 2013-01-26 DIAGNOSIS — I4891 Unspecified atrial fibrillation: Secondary | ICD-10-CM | POA: Diagnosis not present

## 2013-01-26 LAB — POCT INR: INR: 1.6

## 2013-01-27 DIAGNOSIS — D500 Iron deficiency anemia secondary to blood loss (chronic): Secondary | ICD-10-CM | POA: Diagnosis not present

## 2013-01-27 DIAGNOSIS — E782 Mixed hyperlipidemia: Secondary | ICD-10-CM | POA: Diagnosis not present

## 2013-01-27 DIAGNOSIS — I10 Essential (primary) hypertension: Secondary | ICD-10-CM | POA: Diagnosis not present

## 2013-01-27 DIAGNOSIS — Z0000 Encounter for general adult medical examination without abnormal findings: Secondary | ICD-10-CM | POA: Diagnosis not present

## 2013-01-29 ENCOUNTER — Telehealth: Payer: Self-pay | Admitting: Cardiology

## 2013-01-29 NOTE — Telephone Encounter (Signed)
Pt saw dr spear yesterday and her  linsinopril was increased to 1 10mg  in the am and 1 10mg  at night

## 2013-02-02 ENCOUNTER — Other Ambulatory Visit: Payer: Self-pay

## 2013-02-02 DIAGNOSIS — I10 Essential (primary) hypertension: Secondary | ICD-10-CM | POA: Diagnosis not present

## 2013-02-02 MED ORDER — LISINOPRIL 10 MG PO TABS: 10.0000 mg | ORAL_TABLET | Freq: Two times a day (BID) | ORAL | Status: DC

## 2013-02-09 ENCOUNTER — Ambulatory Visit (INDEPENDENT_AMBULATORY_CARE_PROVIDER_SITE_OTHER): Payer: Medicare Other | Admitting: *Deleted

## 2013-02-09 DIAGNOSIS — I4891 Unspecified atrial fibrillation: Secondary | ICD-10-CM | POA: Diagnosis not present

## 2013-02-14 ENCOUNTER — Other Ambulatory Visit: Payer: Self-pay

## 2013-02-26 ENCOUNTER — Ambulatory Visit (INDEPENDENT_AMBULATORY_CARE_PROVIDER_SITE_OTHER): Payer: Medicare Other | Admitting: *Deleted

## 2013-02-26 DIAGNOSIS — I4891 Unspecified atrial fibrillation: Secondary | ICD-10-CM

## 2013-03-20 ENCOUNTER — Ambulatory Visit (INDEPENDENT_AMBULATORY_CARE_PROVIDER_SITE_OTHER): Payer: Medicare Other | Admitting: *Deleted

## 2013-03-20 DIAGNOSIS — I4891 Unspecified atrial fibrillation: Secondary | ICD-10-CM

## 2013-03-20 LAB — POCT INR: INR: 3.3

## 2013-03-27 DIAGNOSIS — J309 Allergic rhinitis, unspecified: Secondary | ICD-10-CM | POA: Diagnosis not present

## 2013-04-03 ENCOUNTER — Ambulatory Visit (INDEPENDENT_AMBULATORY_CARE_PROVIDER_SITE_OTHER): Payer: Medicare Other | Admitting: *Deleted

## 2013-04-03 DIAGNOSIS — I4891 Unspecified atrial fibrillation: Secondary | ICD-10-CM

## 2013-04-03 LAB — POCT INR: INR: 2.7

## 2013-04-08 DIAGNOSIS — G4700 Insomnia, unspecified: Secondary | ICD-10-CM | POA: Diagnosis not present

## 2013-04-13 ENCOUNTER — Telehealth: Payer: Self-pay | Admitting: Cardiology

## 2013-04-13 NOTE — Telephone Encounter (Signed)
New Prob   Pt has some questions regarding appointments. Lisa Lucero is not sure when Lisa Lucero should be coming back in to see Dr. Patty Sermons. Would like to speak to nurse.

## 2013-04-13 NOTE — Telephone Encounter (Signed)
Scheduled appointment

## 2013-04-27 DIAGNOSIS — N3080 Other cystitis without hematuria: Secondary | ICD-10-CM | POA: Diagnosis not present

## 2013-05-01 ENCOUNTER — Ambulatory Visit (INDEPENDENT_AMBULATORY_CARE_PROVIDER_SITE_OTHER): Payer: Medicare Other | Admitting: *Deleted

## 2013-05-01 ENCOUNTER — Ambulatory Visit (INDEPENDENT_AMBULATORY_CARE_PROVIDER_SITE_OTHER): Payer: Medicare Other | Admitting: Cardiology

## 2013-05-01 ENCOUNTER — Encounter: Payer: Self-pay | Admitting: Cardiology

## 2013-05-01 VITALS — BP 156/92 | HR 57 | Ht 66.5 in | Wt 134.4 lb

## 2013-05-01 DIAGNOSIS — I10 Essential (primary) hypertension: Secondary | ICD-10-CM | POA: Diagnosis not present

## 2013-05-01 DIAGNOSIS — I119 Hypertensive heart disease without heart failure: Secondary | ICD-10-CM | POA: Diagnosis not present

## 2013-05-01 DIAGNOSIS — I480 Paroxysmal atrial fibrillation: Secondary | ICD-10-CM

## 2013-05-01 DIAGNOSIS — I4891 Unspecified atrial fibrillation: Secondary | ICD-10-CM

## 2013-05-01 DIAGNOSIS — Z7901 Long term (current) use of anticoagulants: Secondary | ICD-10-CM | POA: Diagnosis not present

## 2013-05-01 MED ORDER — NITROGLYCERIN 0.4 MG SL SUBL: 0.4000 mg | SUBLINGUAL_TABLET | SUBLINGUAL | Status: DC | PRN

## 2013-05-01 MED ORDER — LISINOPRIL 20 MG PO TABS: 20.0000 mg | ORAL_TABLET | Freq: Two times a day (BID) | ORAL | Status: DC

## 2013-05-01 NOTE — Patient Instructions (Addendum)
Will obtain labs today and call you with the results (BMET)  INCREASE YOUR LISINOPRIL TO 20 MG TWICE A DAY, NEW RX SENT TO PHARMACY  Your physician recommends that you schedule a follow-up appointment in: 4 MONTH OV/BMET

## 2013-05-01 NOTE — Assessment & Plan Note (Signed)
The patient is not having any complications from her long-term Coumadin.  She has had no recurrent GI bleed symptoms

## 2013-05-01 NOTE — Assessment & Plan Note (Signed)
The patient has not been aware of any recurrent atrial fibrillation.  She has had no TIA symptoms symptoms or any symptoms of recurrent stroke

## 2013-05-01 NOTE — Assessment & Plan Note (Signed)
Her blood pressure today is running high and we will increase her lisinopril up to 20 mg twice a day.  We're checking a basal metabolic panel today.

## 2013-05-01 NOTE — Progress Notes (Signed)
Lisa Lucero Date of Birth:  [birth year] [facility] [address] [place], [place]  [address] [phone]         Fax   [phone]  History of Present Illness: This pleasant 78 year old woman is seen back for a scheduled followup office visit. She has a past history of paroxysmal atrial fibrillation. She was last hospitalized for atrial fibrillation in March of 2010. She is on long-term Coumadin. After she had gone off Coumadin and was maintaining normal sinus rhythm she was hospitalized with an embolic stroke and was placed back on long-term Coumadin. The Coumadin had to be held in October 2011 when she was admitted to [facility] with a GI bleed secondary to erosive duodenitis. She's had no further problems with her stomach since being placed on long-term proton pump inhibitor. She has been checking her pulse and blood pressure on her home blood pressure monitor. Blood pressure has been ranging high in the 160-170 range most of the time.  she has occasional chest pains and recently tried a sublingual nitroglycerin which seemed to help.   Current Outpatient Prescriptions  Medication Sig Dispense Refill  . acetaminophen (TYLENOL) 500 MG tablet Take 500 mg by mouth 2 (two) times daily.       Marland Kitchen CALCIUM PO Take 1,000 mg by mouth daily.      Marland Kitchen diltiazem (DILACOR XR) 180 MG 24 hr capsule Take 180 mg by mouth daily.        . fenofibrate micronized (LOFIBRA) 134 MG capsule Take 134 mg by mouth daily before breakfast.       . hydrochlorothiazide (HYDRODIURIL) 25 MG tablet Take 12.5 mg by mouth daily.       . hydrocortisone (ANUSOL-HC) 25 MG suppository Place 25 mg rectally as needed.       Marland Kitchen ipratropium (ATROVENT) 0.06 % nasal spray Place 2 sprays into the nose daily.       Marland Kitchen levothyroxine (SYNTHROID, LEVOTHROID) 50 MCG tablet Take 50 mcg by mouth daily.        Marland Kitchen lisinopril (PRINIVIL,ZESTRIL) 20 MG tablet Take 1 tablet (20 mg total) by mouth 2 (two) times daily.  60 tablet  6  .  metoprolol (LOPRESSOR) 50 MG tablet Take 50 mg by mouth 2 (two) times daily.        . Multiple Vitamin (MULITIVITAMIN WITH MINERALS) TABS Take 1 tablet by mouth daily.      . pantoprazole (PROTONIX) 40 MG tablet Take 40 mg by mouth daily.       . potassium chloride (K-DUR,KLOR-CON) 10 MEQ tablet Take 1 tablet (10 mEq total) by mouth 2 (two) times daily.  60 tablet  6  . PREMARIN vaginal cream Once a week.      . traMADol (ULTRAM) 50 MG tablet Take 50 mg by mouth 2 (two) times daily.       Marland Kitchen warfarin (COUMADIN) 5 MG tablet Take as Directed per Anticoagulation Clinic  90 tablet  1  . zolpidem (AMBIEN CR) 6.25 MG CR tablet Take 6.25 mg by mouth at bedtime as needed.       . nitroGLYCERIN (NITROSTAT) 0.4 MG SL tablet Place 1 tablet (0.4 mg total) under the tongue every 5 (five) minutes as needed for chest pain.  25 tablet  PRN  . [DISCONTINUED] diphenhydrAMINE (BENADRYL) 25 MG tablet Take 25 mg by mouth every 6 (six) hours as needed.        . [DISCONTINUED] IRON PO Take 1 tablet by mouth daily.       . [  DISCONTINUED] potassium chloride (K-DUR) 10 MEQ tablet Take 1 tablet (10 mEq total) by mouth 2 (two) times daily.  90 tablet  0   No current facility-administered medications for this visit.    Allergies  Allergen Reactions  . Lipitor (Atorvastatin Calcium)     myalgias    Patient Active Problem List   Diagnosis Date Noted  . Atrial fibrillation 04/23/2011    Priority: High  . Benign hypertensive heart disease without heart failure 05/17/2011    Priority: Medium  . Long-term (current) use of anticoagulants     Priority: Medium  . Gallstone 03/08/2012  . Hemorrhagic cystitis 03/08/2012  . Leukocytosis 03/08/2012  . Hyponatremia 03/08/2012  . Hypochloremia 03/08/2012  . Hypokalemia 03/08/2012  . Dyslipidemia 05/17/2011  . History of embolic stroke   . Hyperthyroidism     History  Smoking status  . Former Smoker  . Types: Cigarettes  . Quit date: 09/30/2010  Smokeless tobacco    . Never Used    History  Alcohol Use  . 0.6 oz/week  . 1 Glasses of wine per week    Comment: occassional    Family History  Problem Relation Age of Onset  . Stroke Mother   . Dementia Father   . Hypertension    . Arthritis    . Cancer Brother     lung    Review of Systems: Constitutional: no fever chills diaphoresis or fatigue or change in weight.  Head and neck: no hearing loss, no epistaxis, no photophobia or visual disturbance. Respiratory: No cough, shortness of breath or wheezing. Cardiovascular: No chest pain peripheral edema, palpitations. Gastrointestinal: No abdominal distention, no abdominal pain, no change in bowel habits hematochezia or melena. Genitourinary: No dysuria, no frequency, no urgency, no nocturia. Musculoskeletal:No arthralgias, no back pain, no gait disturbance or myalgias. Neurological: No dizziness, no headaches, no numbness, no seizures, no syncope, no weakness, no tremors. Hematologic: No lymphadenopathy, no easy bruising. Psychiatric: No confusion, no hallucinations, no sleep disturbance.    Physical Exam: Filed Vitals:   05/01/13 0944  BP: 156/92  Pulse: 57  the general appearance reveals a well-developed well-nourished elderly woman in no distress.The head and neck exam reveals pupils equal and reactive.  Extraocular movements are full.  There is no scleral icterus.  The mouth and pharynx are normal.  The neck is supple.  The carotids reveal no bruits.  The jugular venous pressure is normal.  The  thyroid is not enlarged.  There is no lymphadenopathy.  The chest is clear to percussion and auscultation.  There are no rales or rhonchi.  Expansion of the chest is symmetrical.  The precordium is quiet.  The first heart sound is normal.  The second heart sound is physiologically split.  There is no murmur gallop rub or click.  There is no abnormal lift or heave.  The abdomen is soft and nontender.  The bowel sounds are normal.  The liver and spleen  are not enlarged.  There are no abdominal masses.  There are no abdominal bruits.  Extremities reveal good pedal pulses.  There is no phlebitis or edema.  There is no cyanosis or clubbing.  Strength is normal and symmetrical in all extremities.  There is no lateralizing weakness.  There are no sensory deficits.  The skin is warm and dry.  There is no rash.    EKG shows sinus bradycardia with possible left atrial enlargement and since 10/03/12, no significant change   Assessment / Plan: Continue  same medication.  We are increasing her lisinopril up to 20 mg twice a day for better blood pressure control.  Recheck in 4 months for followup office visit and basal metabolic panel

## 2013-05-04 LAB — BASIC METABOLIC PANEL
BUN: 25 mg/dL — ABNORMAL HIGH (ref 6–23)
GFR: 40.88 mL/min — ABNORMAL LOW (ref >60.00)
Potassium: 3.5 mEq/L (ref 3.5–5.1)
Sodium: 137 mEq/L (ref 135–145)

## 2013-05-05 NOTE — Progress Notes (Signed)
Quick Note:  Please report to patient. The recent labs are stable. Continue same medication and careful diet. ______ 

## 2013-05-06 ENCOUNTER — Telehealth: Payer: Self-pay | Admitting: *Deleted

## 2013-05-06 NOTE — Telephone Encounter (Signed)
Message copied by Burnell Blanks on Wed May 06, 2013  8:20 AM ------      Message from: Cassell Clement      Created: Tue May 05, 2013  6:01 AM       Please report to patient.  The recent labs are stable. Continue same medication and careful diet. ------

## 2013-05-06 NOTE — Telephone Encounter (Signed)
Mailed copy of labs and left message to call if any questions  

## 2013-05-26 DIAGNOSIS — I87399 Chronic venous hypertension (idiopathic) with other complications of unspecified lower extremity: Secondary | ICD-10-CM | POA: Diagnosis not present

## 2013-05-26 DIAGNOSIS — E785 Hyperlipidemia, unspecified: Secondary | ICD-10-CM | POA: Diagnosis not present

## 2013-05-26 DIAGNOSIS — E039 Hypothyroidism, unspecified: Secondary | ICD-10-CM | POA: Diagnosis not present

## 2013-05-26 DIAGNOSIS — R5381 Other malaise: Secondary | ICD-10-CM | POA: Diagnosis not present

## 2013-05-26 DIAGNOSIS — Z79899 Other long term (current) drug therapy: Secondary | ICD-10-CM | POA: Diagnosis not present

## 2013-05-26 DIAGNOSIS — M810 Age-related osteoporosis without current pathological fracture: Secondary | ICD-10-CM | POA: Diagnosis not present

## 2013-05-26 DIAGNOSIS — R5383 Other fatigue: Secondary | ICD-10-CM | POA: Diagnosis not present

## 2013-06-02 ENCOUNTER — Other Ambulatory Visit: Payer: Self-pay | Admitting: *Deleted

## 2013-06-02 MED ORDER — WARFARIN SODIUM 5 MG PO TABS: ORAL_TABLET | ORAL | Status: DC

## 2013-06-05 ENCOUNTER — Ambulatory Visit (INDEPENDENT_AMBULATORY_CARE_PROVIDER_SITE_OTHER): Payer: Medicare Other | Admitting: *Deleted

## 2013-06-05 DIAGNOSIS — I4891 Unspecified atrial fibrillation: Secondary | ICD-10-CM | POA: Diagnosis not present

## 2013-06-05 LAB — POCT INR: INR: 2.6

## 2013-06-16 DIAGNOSIS — L578 Other skin changes due to chronic exposure to nonionizing radiation: Secondary | ICD-10-CM | POA: Diagnosis not present

## 2013-06-16 DIAGNOSIS — Z85828 Personal history of other malignant neoplasm of skin: Secondary | ICD-10-CM | POA: Diagnosis not present

## 2013-07-07 ENCOUNTER — Encounter: Payer: Self-pay | Admitting: Cardiology

## 2013-07-07 DIAGNOSIS — K644 Residual hemorrhoidal skin tags: Secondary | ICD-10-CM | POA: Diagnosis not present

## 2013-07-07 DIAGNOSIS — E785 Hyperlipidemia, unspecified: Secondary | ICD-10-CM | POA: Diagnosis not present

## 2013-07-07 DIAGNOSIS — I672 Cerebral atherosclerosis: Secondary | ICD-10-CM | POA: Diagnosis not present

## 2013-07-09 ENCOUNTER — Other Ambulatory Visit: Payer: Self-pay | Admitting: Endocrinology

## 2013-07-09 DIAGNOSIS — E041 Nontoxic single thyroid nodule: Secondary | ICD-10-CM | POA: Diagnosis not present

## 2013-07-09 DIAGNOSIS — E039 Hypothyroidism, unspecified: Secondary | ICD-10-CM | POA: Diagnosis not present

## 2013-07-14 ENCOUNTER — Ambulatory Visit
Admission: RE | Admit: 2013-07-14 | Discharge: 2013-07-14 | Disposition: A | Discharge status: Home / Self Care | Payer: Medicare Other | Source: Ambulatory Visit | Attending: Endocrinology | Admitting: Endocrinology

## 2013-07-14 DIAGNOSIS — E041 Nontoxic single thyroid nodule: Secondary | ICD-10-CM

## 2013-07-17 ENCOUNTER — Ambulatory Visit (INDEPENDENT_AMBULATORY_CARE_PROVIDER_SITE_OTHER): Payer: Medicare Other | Admitting: Pharmacist

## 2013-07-17 DIAGNOSIS — I4891 Unspecified atrial fibrillation: Secondary | ICD-10-CM

## 2013-08-05 ENCOUNTER — Other Ambulatory Visit: Payer: Self-pay

## 2013-08-12 IMAGING — CT CT ABD-PELV W/ CM
2 of 5 series · 13 of 32 positions shown, 18 images · IV contrast (water/omni  & 80ml omni 300)
Comparison: None.

CLINICAL DATA: Right abdominal pain and palpable mass.

CT ABDOMEN AND PELVIS WITH CONTRAST
TECHNIQUE: Multidetector CT imaging of the abdomen and pelvis was
performed following the standard protocol during bolus
administration of intravenous contrast.
Contrast: 80mL OMNIPAQUE IOHEXOL 300 MG/ML IJ SOLN

[Series 2: routine abdomen · axial · 0.66mm/px · z∈[-415,-140]mm · 7 of 75 slices shown, 12 images]
[im 10/75  soft-tissue]
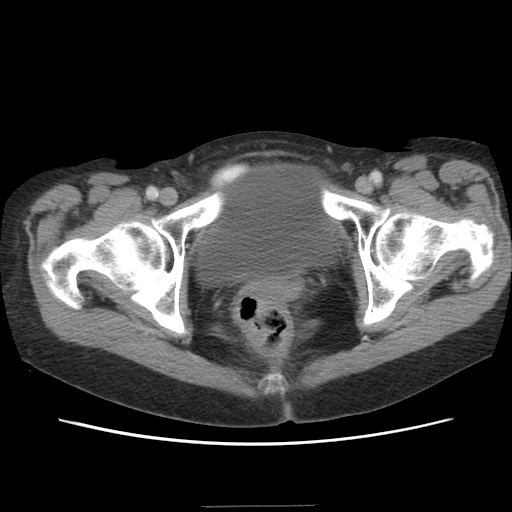
[im 10/75  bone]
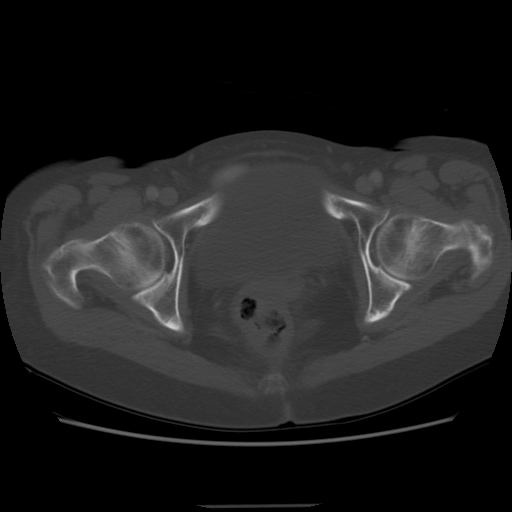
[im 19/75  soft-tissue]
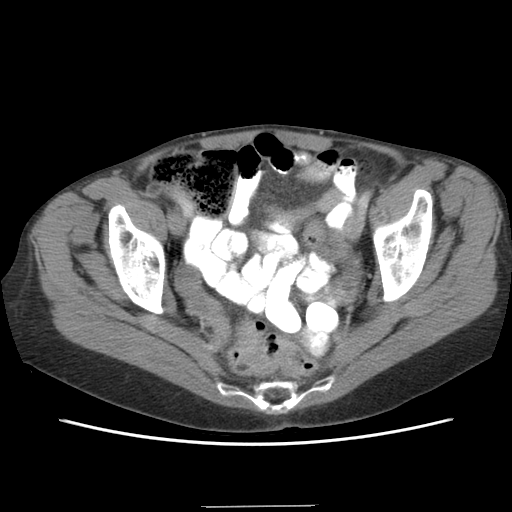
[im 28/75  soft-tissue]
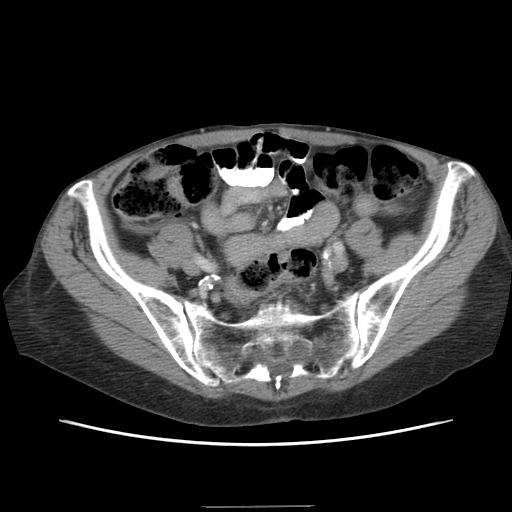
[im 38/75  soft-tissue]
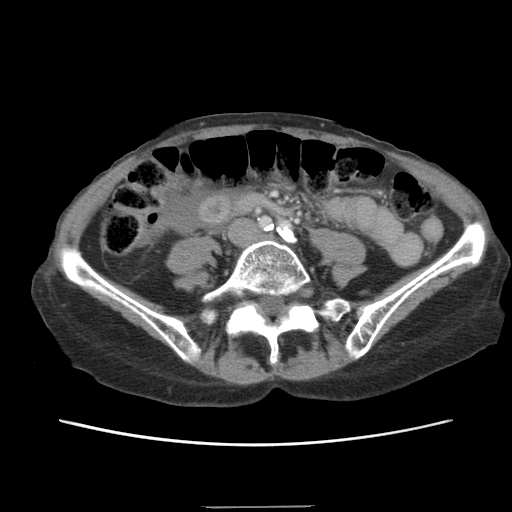
[im 38/75  lung]
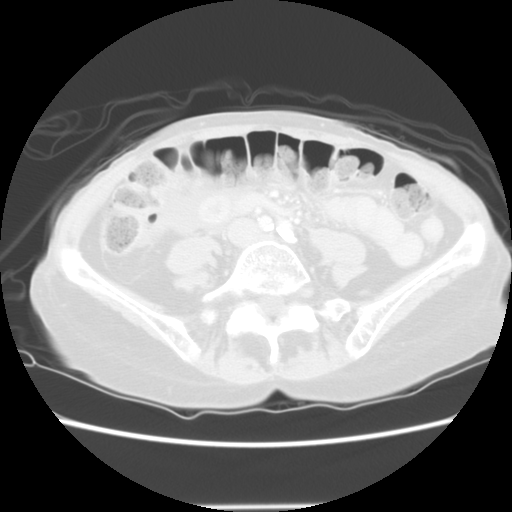
[im 47/75  soft-tissue]
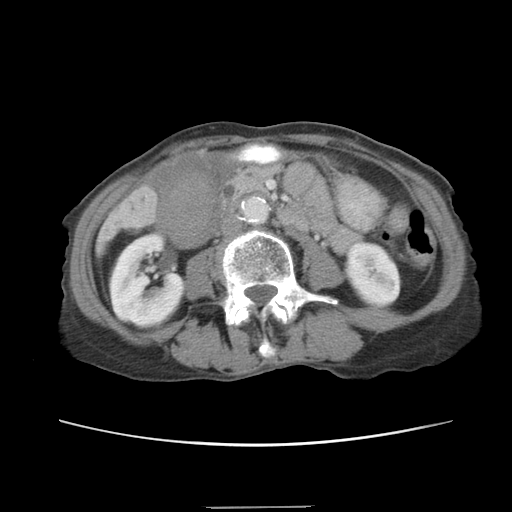
[im 47/75  lung]
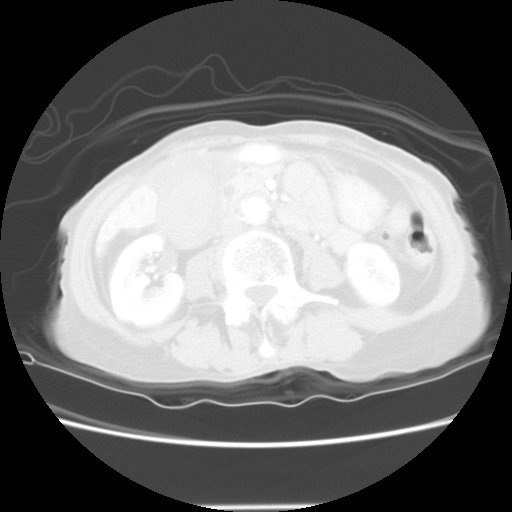
[im 56/75  soft-tissue]
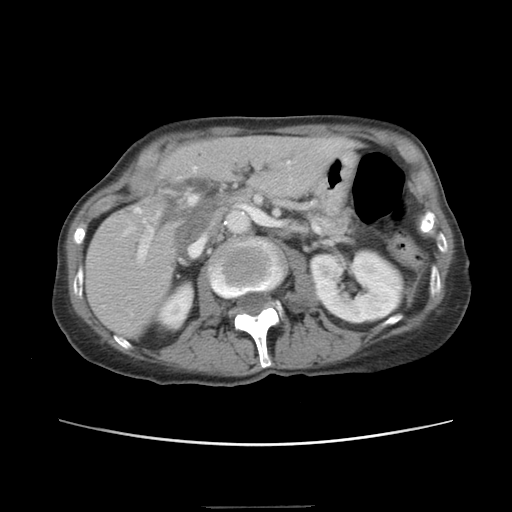
[im 56/75  lung]
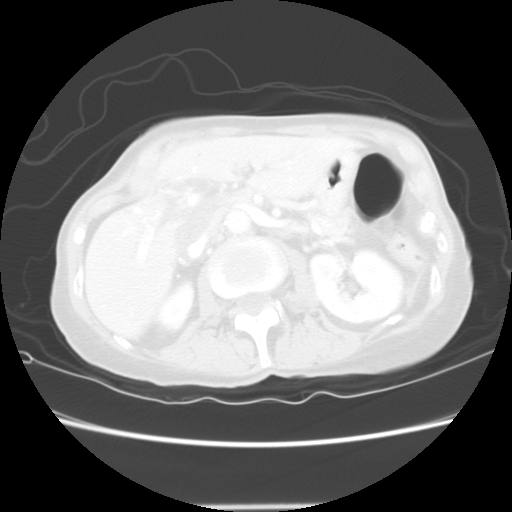
[im 65/75  soft-tissue]
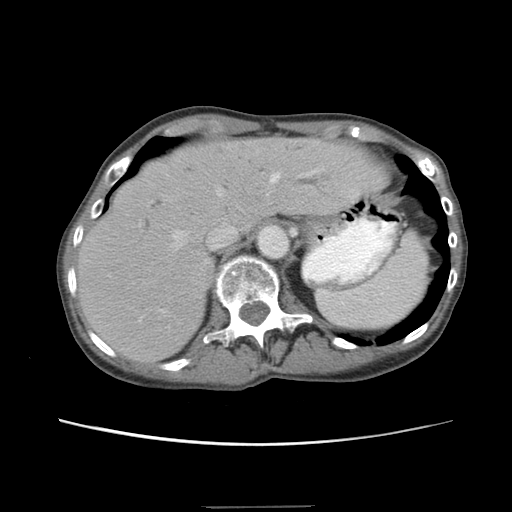
[im 65/75  lung]
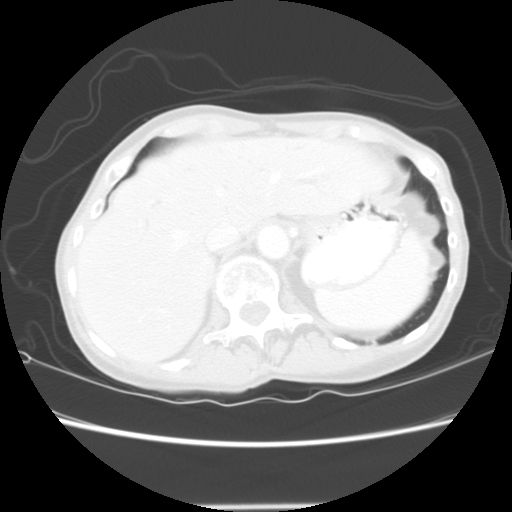

[Series 401: sag · sagittal · 0.77mm/px · 6 of 97 slices shown]
[im 10/97  soft-tissue]
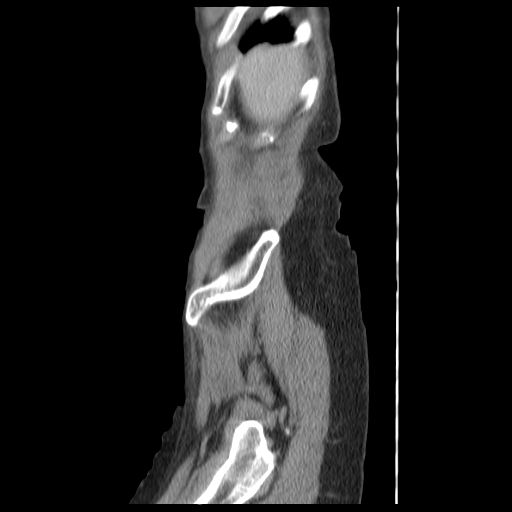
[im 20/97  soft-tissue]
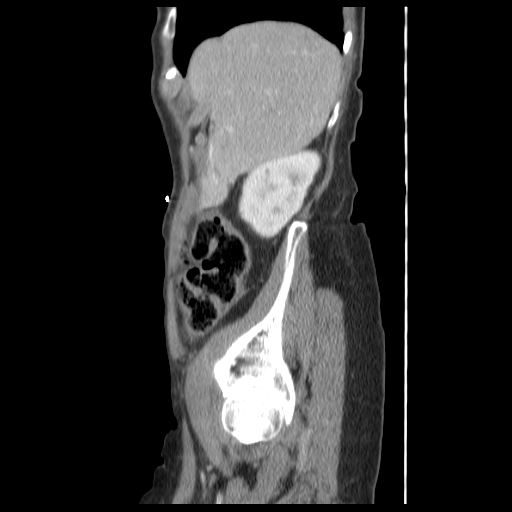
[im 29/97  soft-tissue]
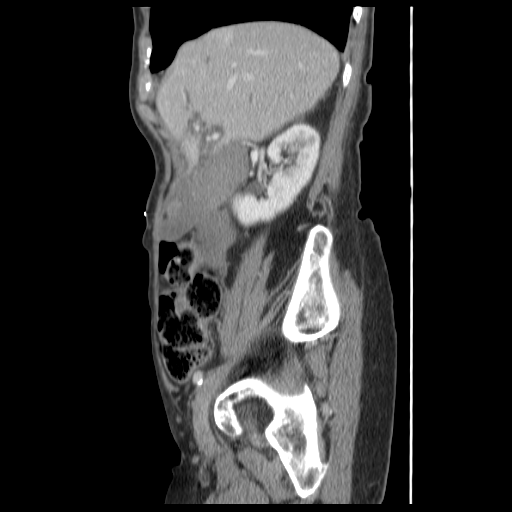
[im 39/97  soft-tissue]
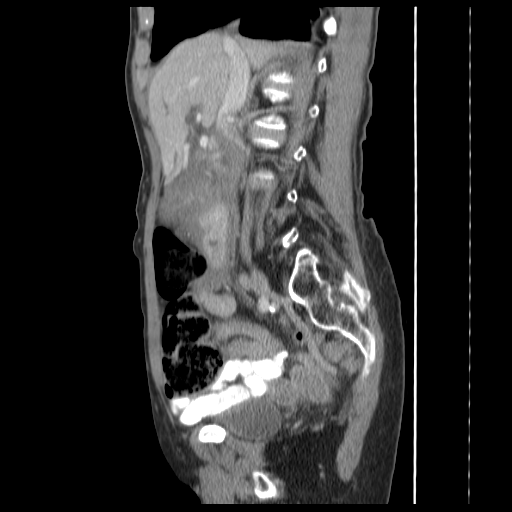
[im 58/97  soft-tissue]
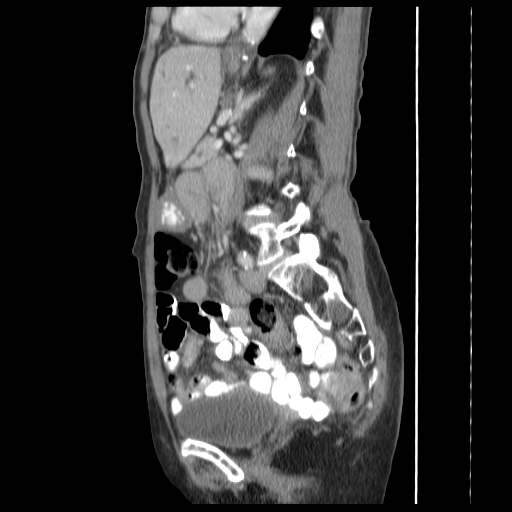
[im 68/97  soft-tissue]
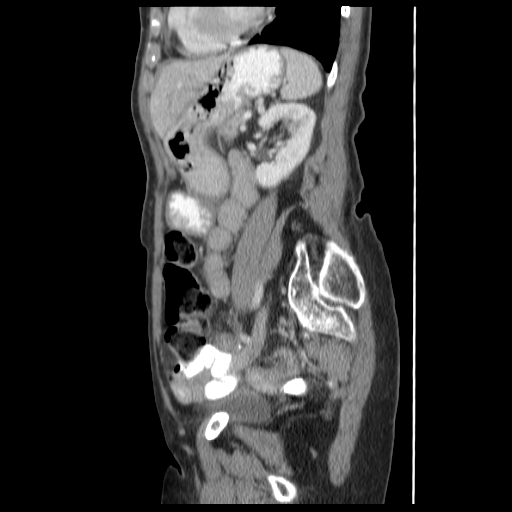

[13 of 32 positions shown; findings below may reference images not displayed]

FINDINGS: The gallbladder is markedly abnormal and enlarged
containing medium density sludge or soft tissue surrounding a
cm gallstone.  There is also medium density fluid surrounding the
gallbladder and tracking inferiorly from the gallbladder posterior
to the transverse colon and partially surrounding the duodenum.
There is also medial displacement and flattening of the second
portion of the duodenum by these changes.

Also noted is pronounced intrahepatic biliary ductal dilatation and
less pronounced extrahepatic biliary ductal dilatation.  No
obstructing stone or pancreatic mass is visualized.  Mildly dilated
pancreatic duct, measuring 3.3 mm in diameter.

Mild changes of COPD at the lung bases.  The heart is borderline
enlarged.  Unremarkable spleen. The adrenal glands, kidneys and
urinary bladder are unremarkable.  Surgically absent uterus.  No
adnexal masses or enlarged lymph nodes seen.  No gastrointestinal
abnormalities.  Mild lumbar spine degenerative changes.
IMPRESSION: 1.  Dilated gallbladder containing a 2.2 cm gallstone and filled
with medium density sludge or tumor.
2.  Medium density pericholecystic fluid tracking inferiorly, as
described above.  This may represent hemorrhage, potentially
associated with hemorrhagic cholecystitis.
3.  The changes in and around the gallbladder are secondarily
affecting the second portion of the duodenum, with mass effect on
the duodenum.
4.  Biliary and pancreatic ductal dilatation with no visible
obstructing stone or mass.
5.  COPD and borderline cardiomegaly.

These results were called by telephone on 03/08/2012  at  7465
hours to  Dr. Ulises, who verbally acknowledged these results.

## 2013-08-15 IMAGING — RF DG CHOLANGIOGRAM OPERATIVE
1 series · 4 of 4 positions shown · non-contrast
Comparison: None

CLINICAL DATA: Cholelithiasis

INTRAOPERATIVE CHOLANGIOGRAM
TECHNIQUE: Cholangiographic images from the C-arm fluoroscopic
device were submitted for interpretation post-operatively.  Please
see the procedural report for the amount of contrast and the
fluoroscopy time utilized.

[Series 1: run · 4 of 29 frames shown]
[frame 5/29]
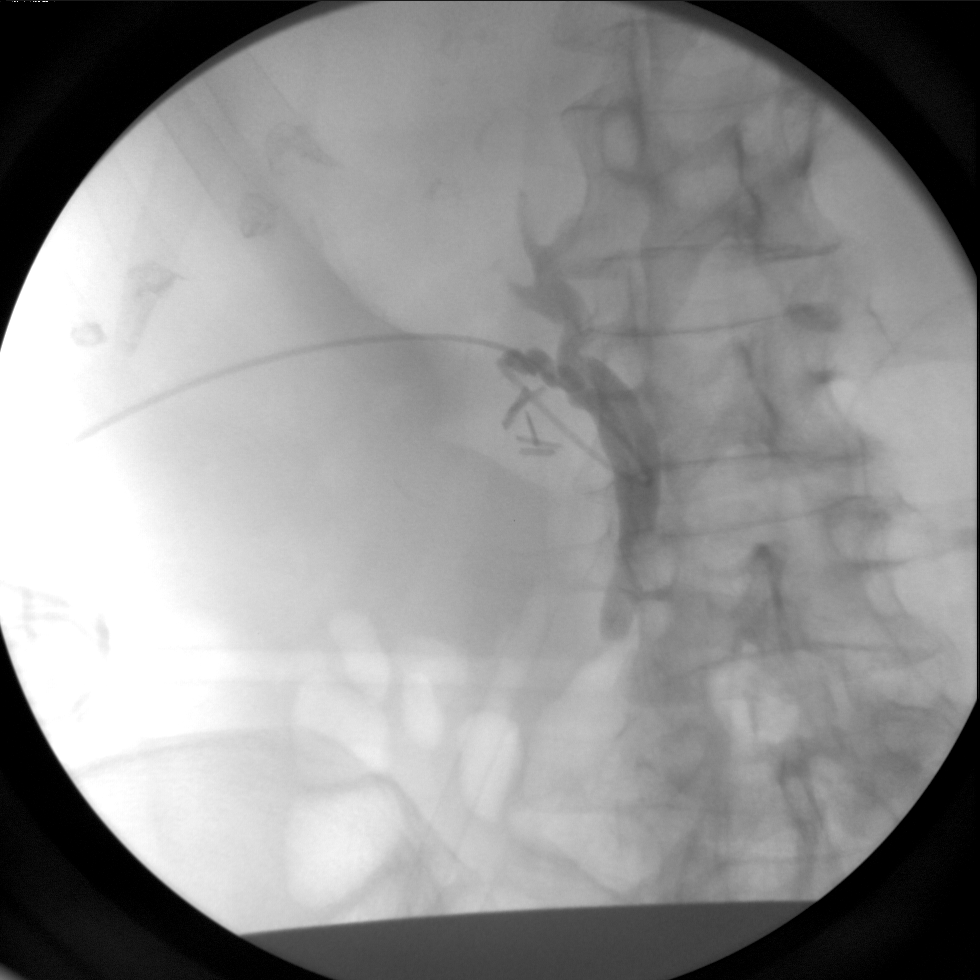
[frame 15/29]
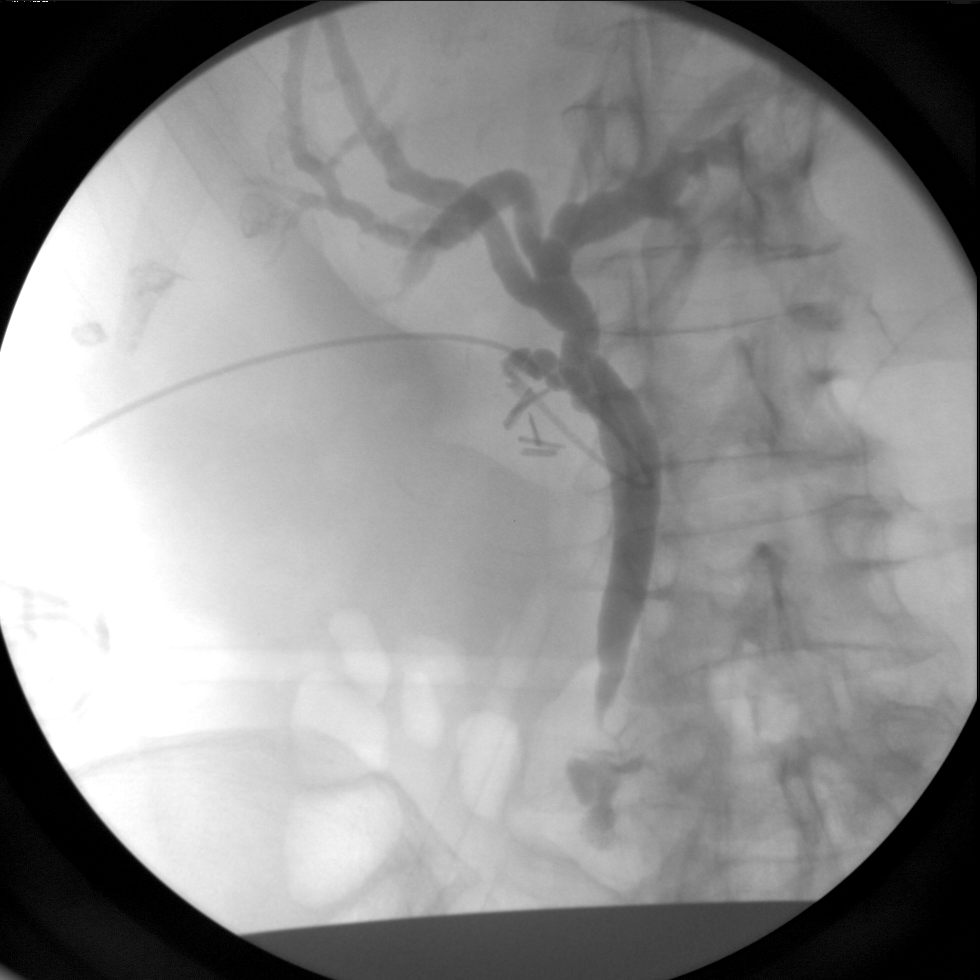
[frame 25/29]
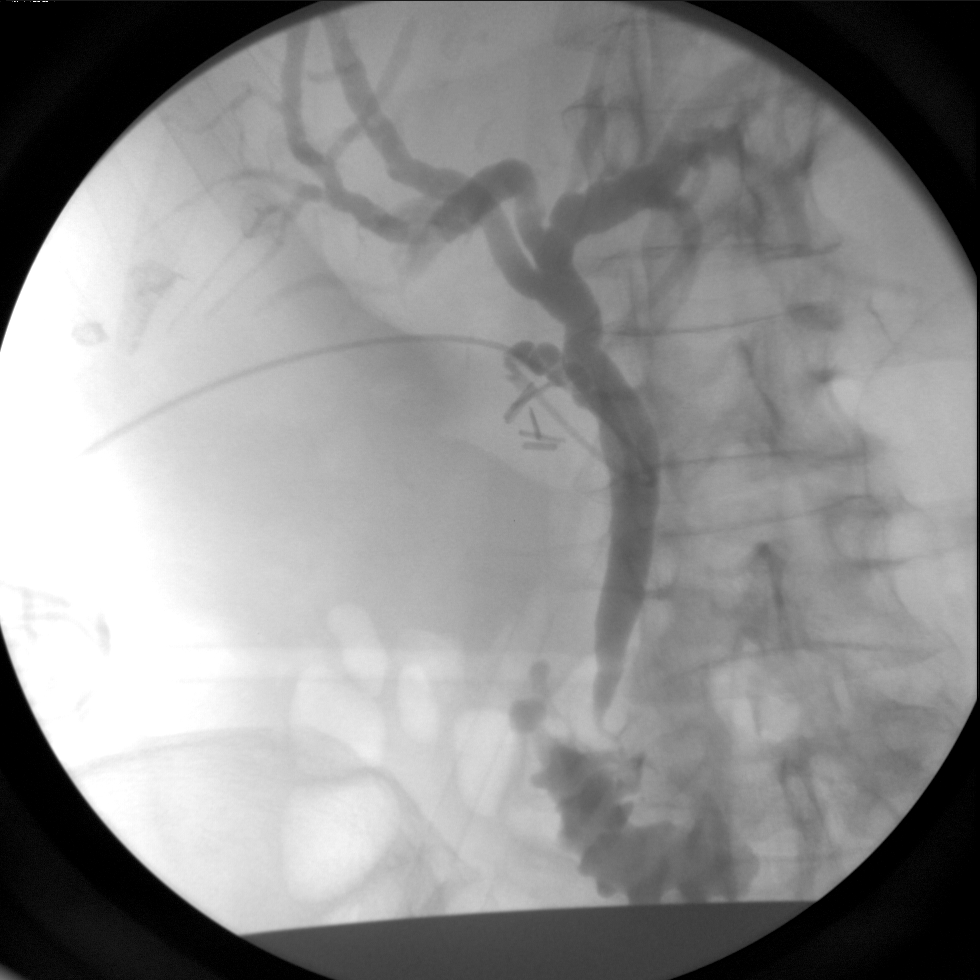
[frame 27/29]
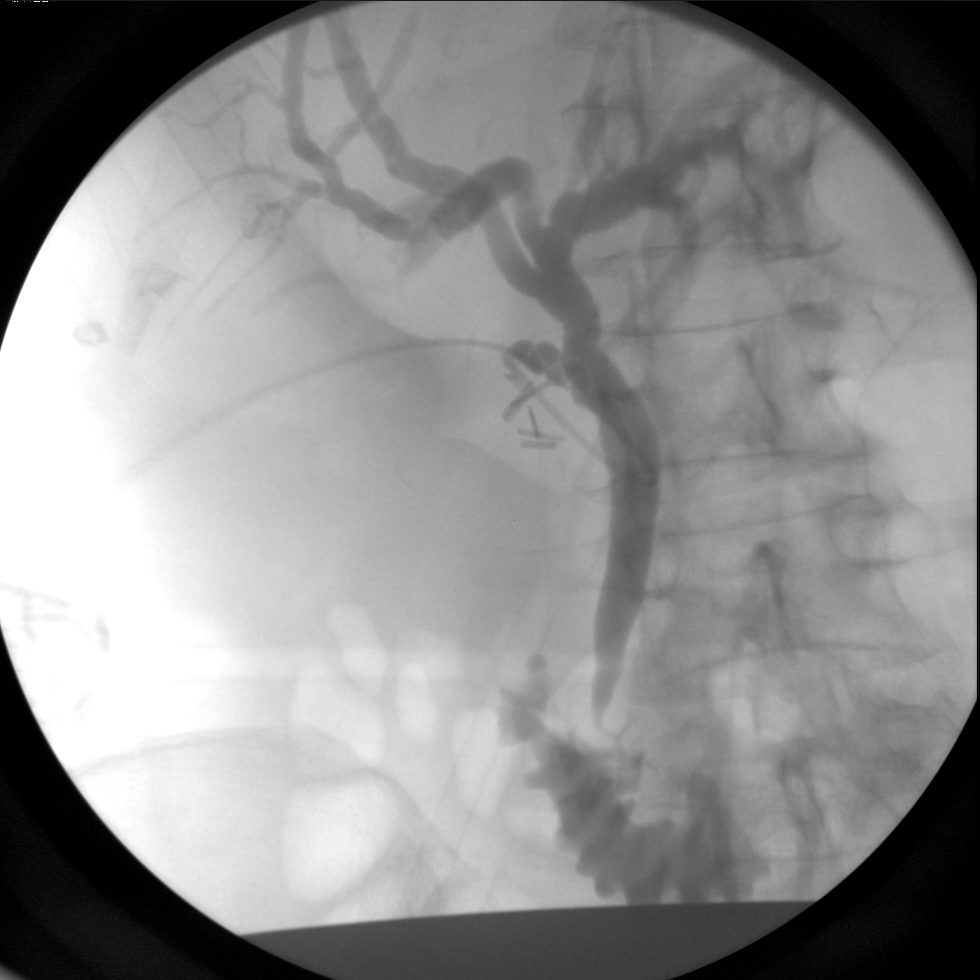

[4 of 4 positions shown; findings below may reference images not displayed]

FINDINGS: No persistent filling defects in the common duct.
Intrahepatic ducts are incompletely visualized, appearing
decompressed centrally. Contrast passes into the duodenum.

IMPRESSION

Negative for retained common duct stone.

## 2013-08-28 ENCOUNTER — Ambulatory Visit (INDEPENDENT_AMBULATORY_CARE_PROVIDER_SITE_OTHER): Payer: Medicare Other | Admitting: Pharmacist

## 2013-08-28 DIAGNOSIS — I4891 Unspecified atrial fibrillation: Secondary | ICD-10-CM | POA: Diagnosis not present

## 2013-10-09 ENCOUNTER — Ambulatory Visit (INDEPENDENT_AMBULATORY_CARE_PROVIDER_SITE_OTHER): Payer: Medicare Other | Admitting: General Practice

## 2013-10-09 DIAGNOSIS — I4891 Unspecified atrial fibrillation: Secondary | ICD-10-CM | POA: Diagnosis not present

## 2013-10-16 DIAGNOSIS — G4700 Insomnia, unspecified: Secondary | ICD-10-CM | POA: Diagnosis not present

## 2013-10-16 DIAGNOSIS — Z23 Encounter for immunization: Secondary | ICD-10-CM | POA: Diagnosis not present

## 2013-11-05 ENCOUNTER — Other Ambulatory Visit: Payer: Self-pay

## 2013-11-06 ENCOUNTER — Ambulatory Visit (INDEPENDENT_AMBULATORY_CARE_PROVIDER_SITE_OTHER): Payer: Medicare Other | Admitting: Pharmacist

## 2013-11-06 DIAGNOSIS — I4891 Unspecified atrial fibrillation: Secondary | ICD-10-CM | POA: Diagnosis not present

## 2013-11-06 LAB — POCT INR: INR: 2.4

## 2013-11-13 DIAGNOSIS — G4700 Insomnia, unspecified: Secondary | ICD-10-CM | POA: Diagnosis not present

## 2013-12-04 ENCOUNTER — Ambulatory Visit (INDEPENDENT_AMBULATORY_CARE_PROVIDER_SITE_OTHER): Payer: Medicare Other | Admitting: *Deleted

## 2013-12-04 DIAGNOSIS — I4891 Unspecified atrial fibrillation: Secondary | ICD-10-CM

## 2013-12-04 LAB — POCT INR: INR: 2.5

## 2013-12-11 DIAGNOSIS — G4700 Insomnia, unspecified: Secondary | ICD-10-CM | POA: Diagnosis not present

## 2013-12-11 DIAGNOSIS — Z23 Encounter for immunization: Secondary | ICD-10-CM | POA: Diagnosis not present

## 2013-12-20 IMAGING — US US SOFT TISSUE HEAD/NECK
1 series · 14 of 25 positions shown · non-contrast
Comparison: 01/07/2012

CLINICAL DATA: Goiter

THYROID ULTRASOUND
TECHNIQUE: Ultrasound examination of the thyroid gland and adjacent
soft tissues was performed.

[Series 1: us soft tissue head/neck · 0.07mm/px · 14 of 44 slices shown]
[im 1/44]
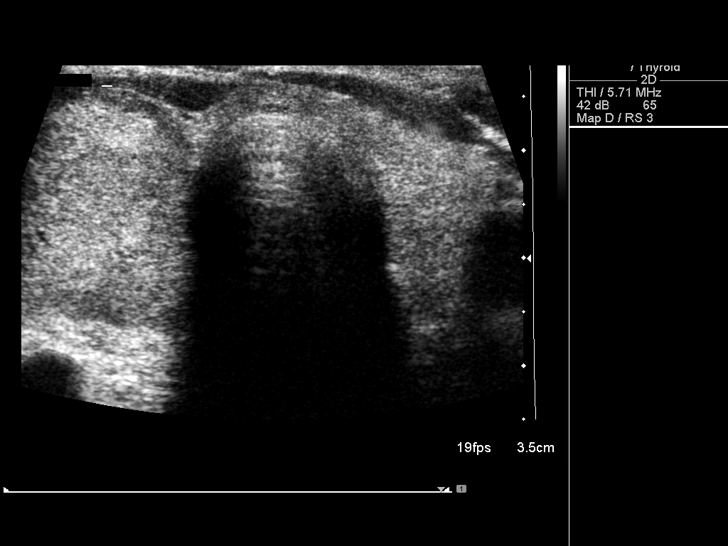
[im 4/44]
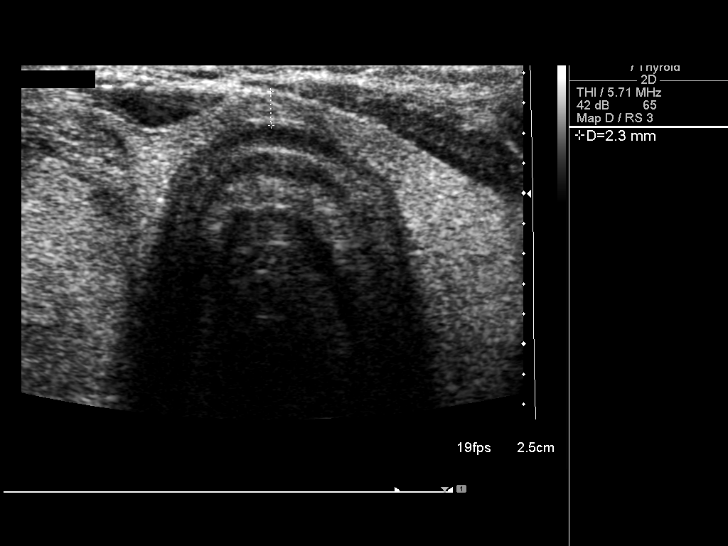
[im 8/44]
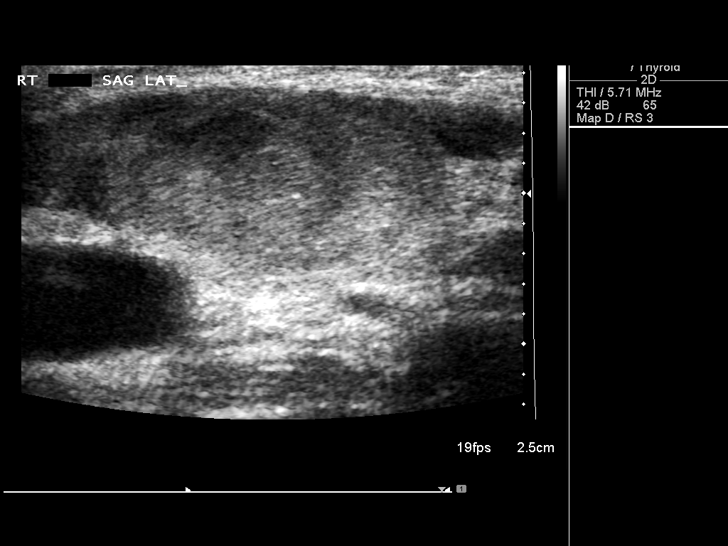
[im 11/44]
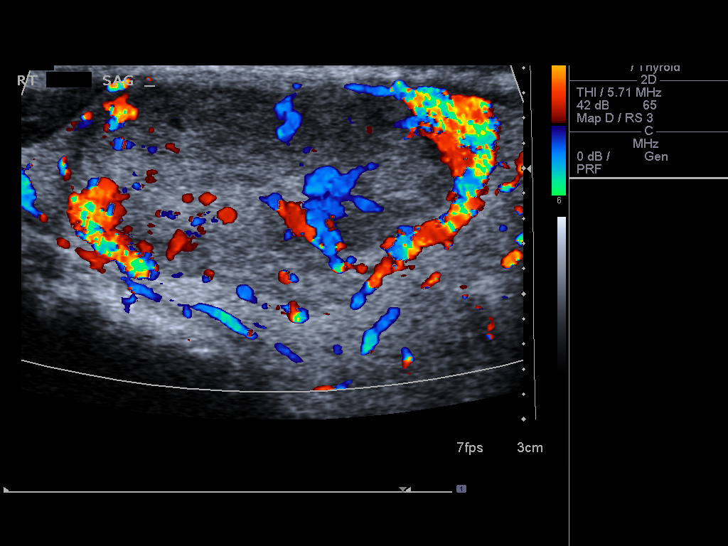
[im 15/44]
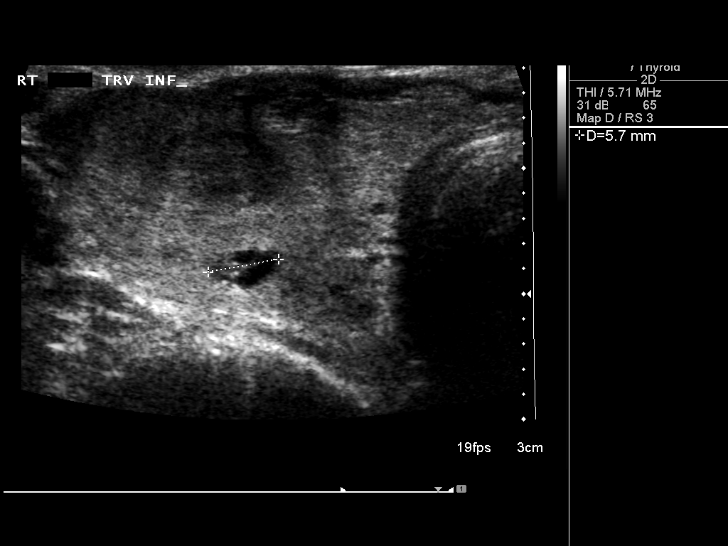
[im 17/44]
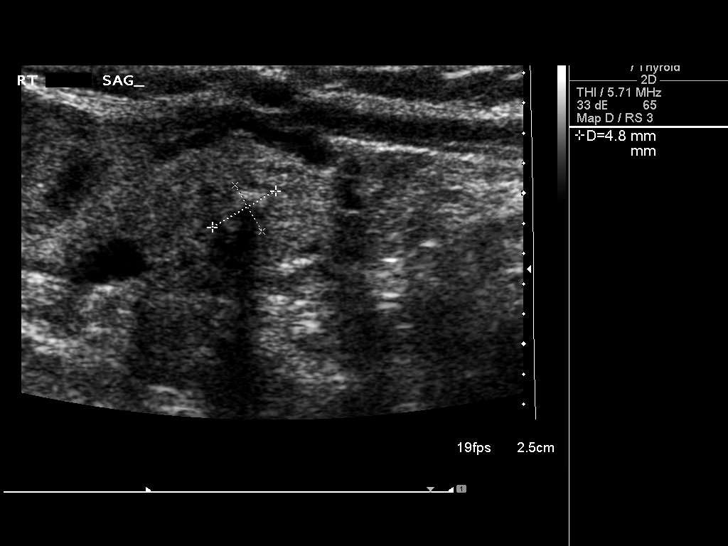
[im 20/44]
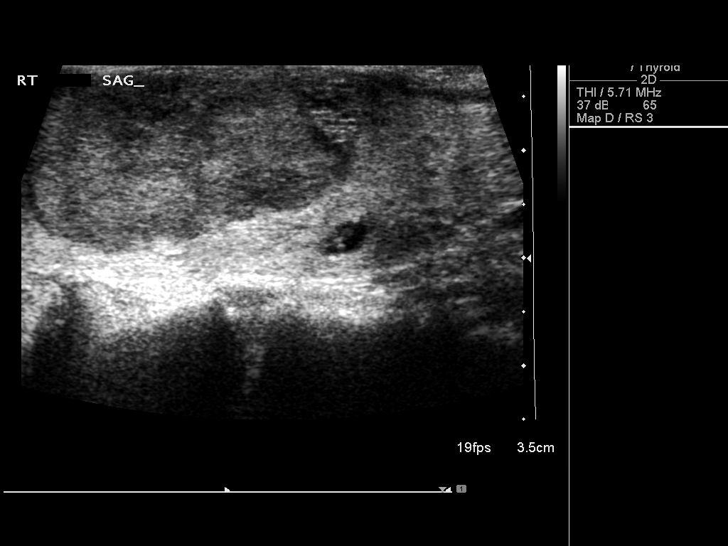
[im 24/44]
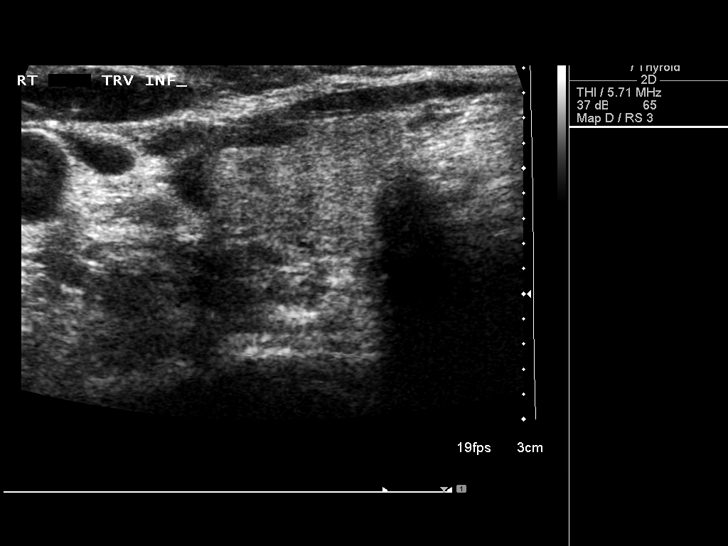
[im 27/44]
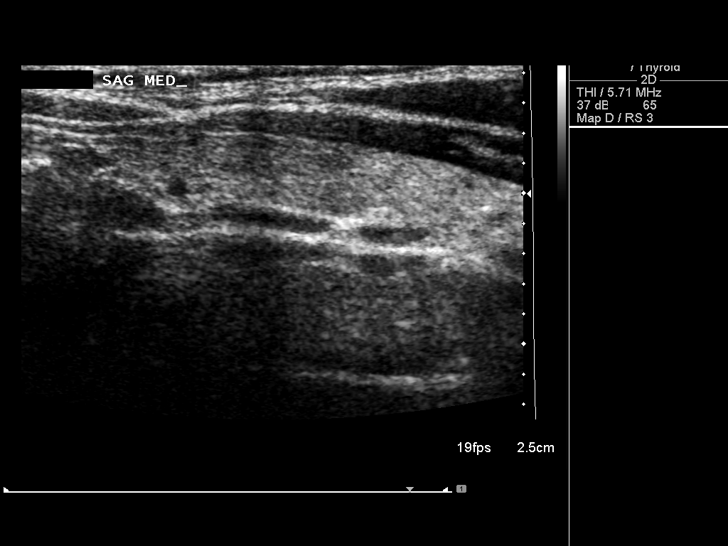
[im 29/44]
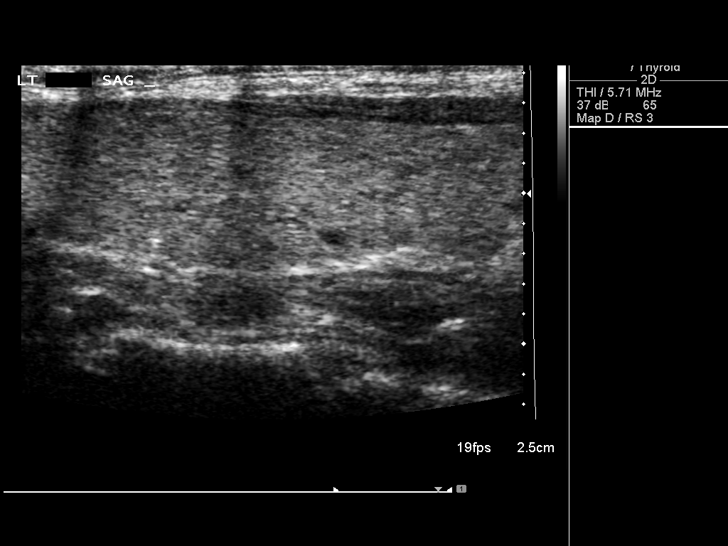
[im 33/44]
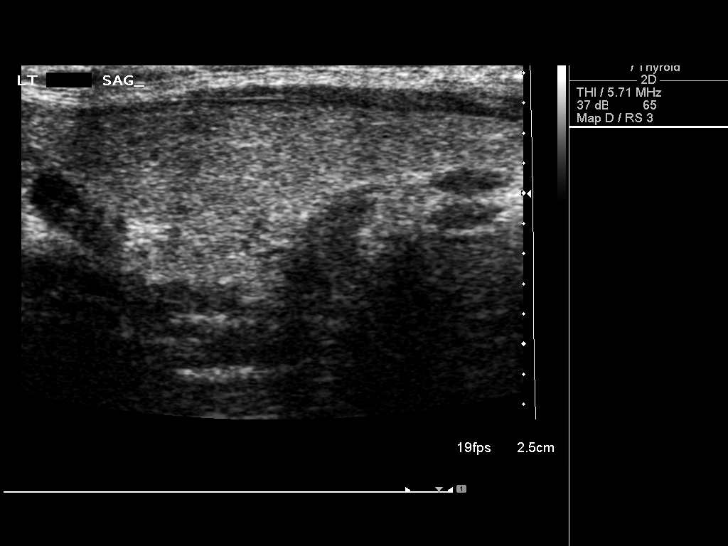
[im 36/44]
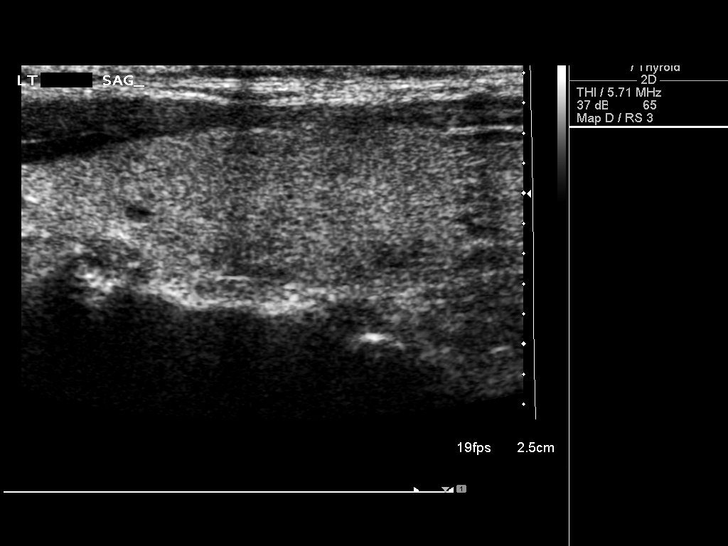
[im 40/44]
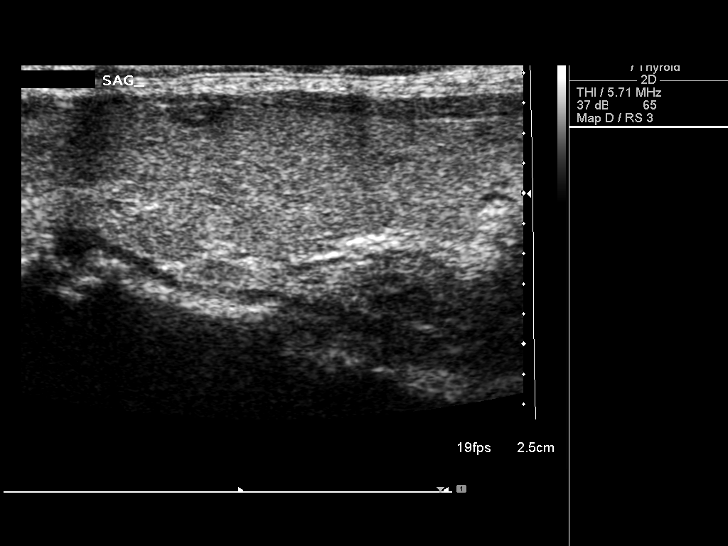
[im 44/44]
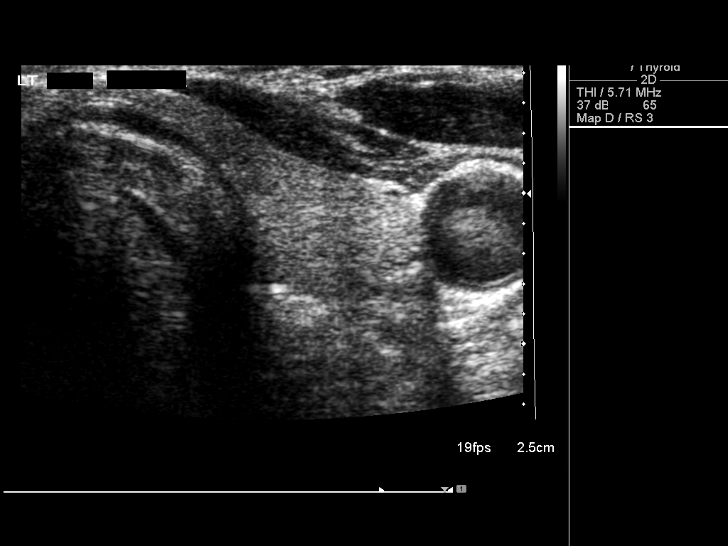

[14 of 25 positions shown; findings below may reference images not displayed]

FINDINGS: Right thyroid lobe:  Measures 5.3 x 2.2 x 2.7 cm (previously 5.3 x
2.2 x 2.6 cm).
Left thyroid lobe:  Measures 4.8 x 1.7 x 1.3 cm (previously 4.8 x
1.2 x 0.4 cm).
Isthmus:  Measures 2 mm in thickness (previously 3 mm).

Focal nodules:  Dominant 3.4 x 1.8 x 2.6 cm solid nodule in the
right mid gland, previously 3.4 x 1.9 x 2.4 cm, unchanged.  5 mm
coarse shadowing calcification in the inferior right gland,
unchanged.  Additional scattered cyst/hypoechoic nodules measuring
up to 7 mm, grossly unchanged

Lymphadenopathy:  None visualized.
IMPRESSION: Stable appearance of the dominant 3.4 cm nodule in the right mid
gland.

## 2013-12-28 ENCOUNTER — Encounter: Payer: Self-pay | Admitting: Cardiology

## 2013-12-28 DIAGNOSIS — E876 Hypokalemia: Secondary | ICD-10-CM | POA: Diagnosis not present

## 2013-12-28 DIAGNOSIS — E785 Hyperlipidemia, unspecified: Secondary | ICD-10-CM | POA: Diagnosis not present

## 2013-12-28 DIAGNOSIS — E039 Hypothyroidism, unspecified: Secondary | ICD-10-CM | POA: Diagnosis not present

## 2013-12-28 DIAGNOSIS — I10 Essential (primary) hypertension: Secondary | ICD-10-CM | POA: Diagnosis not present

## 2013-12-28 DIAGNOSIS — R5381 Other malaise: Secondary | ICD-10-CM | POA: Diagnosis not present

## 2013-12-28 DIAGNOSIS — Z79899 Other long term (current) drug therapy: Secondary | ICD-10-CM | POA: Diagnosis not present

## 2013-12-28 DIAGNOSIS — M810 Age-related osteoporosis without current pathological fracture: Secondary | ICD-10-CM | POA: Diagnosis not present

## 2014-01-04 ENCOUNTER — Ambulatory Visit (INDEPENDENT_AMBULATORY_CARE_PROVIDER_SITE_OTHER): Payer: Medicare Other

## 2014-01-04 DIAGNOSIS — E039 Hypothyroidism, unspecified: Secondary | ICD-10-CM | POA: Diagnosis not present

## 2014-01-04 DIAGNOSIS — I4891 Unspecified atrial fibrillation: Secondary | ICD-10-CM | POA: Diagnosis not present

## 2014-01-04 DIAGNOSIS — G4700 Insomnia, unspecified: Secondary | ICD-10-CM | POA: Diagnosis not present

## 2014-01-04 DIAGNOSIS — I10 Essential (primary) hypertension: Secondary | ICD-10-CM | POA: Diagnosis not present

## 2014-01-04 DIAGNOSIS — E876 Hypokalemia: Secondary | ICD-10-CM | POA: Diagnosis not present

## 2014-01-04 DIAGNOSIS — E785 Hyperlipidemia, unspecified: Secondary | ICD-10-CM | POA: Diagnosis not present

## 2014-01-04 DIAGNOSIS — E049 Nontoxic goiter, unspecified: Secondary | ICD-10-CM | POA: Diagnosis not present

## 2014-01-04 DIAGNOSIS — Z8673 Personal history of transient ischemic attack (TIA), and cerebral infarction without residual deficits: Secondary | ICD-10-CM | POA: Diagnosis not present

## 2014-01-04 LAB — POCT INR: INR: 2.3

## 2014-01-22 DIAGNOSIS — J310 Chronic rhinitis: Secondary | ICD-10-CM | POA: Diagnosis not present

## 2014-01-26 DIAGNOSIS — M47817 Spondylosis without myelopathy or radiculopathy, lumbosacral region: Secondary | ICD-10-CM | POA: Diagnosis not present

## 2014-01-26 DIAGNOSIS — M1610 Unilateral primary osteoarthritis, unspecified hip: Secondary | ICD-10-CM | POA: Diagnosis not present

## 2014-01-26 DIAGNOSIS — M810 Age-related osteoporosis without current pathological fracture: Secondary | ICD-10-CM | POA: Diagnosis not present

## 2014-01-26 DIAGNOSIS — R5381 Other malaise: Secondary | ICD-10-CM | POA: Diagnosis not present

## 2014-01-26 DIAGNOSIS — E876 Hypokalemia: Secondary | ICD-10-CM | POA: Diagnosis not present

## 2014-01-26 DIAGNOSIS — M545 Low back pain, unspecified: Secondary | ICD-10-CM | POA: Diagnosis not present

## 2014-01-26 DIAGNOSIS — R5383 Other fatigue: Secondary | ICD-10-CM | POA: Diagnosis not present

## 2014-01-26 DIAGNOSIS — I10 Essential (primary) hypertension: Secondary | ICD-10-CM | POA: Diagnosis not present

## 2014-02-19 ENCOUNTER — Ambulatory Visit (INDEPENDENT_AMBULATORY_CARE_PROVIDER_SITE_OTHER): Payer: Medicare Other | Admitting: *Deleted

## 2014-02-19 DIAGNOSIS — Z5181 Encounter for therapeutic drug level monitoring: Secondary | ICD-10-CM | POA: Diagnosis not present

## 2014-02-19 DIAGNOSIS — I4891 Unspecified atrial fibrillation: Secondary | ICD-10-CM | POA: Diagnosis not present

## 2014-02-19 LAB — POCT INR: INR: 2.5

## 2014-03-12 DIAGNOSIS — K649 Unspecified hemorrhoids: Secondary | ICD-10-CM | POA: Diagnosis not present

## 2014-03-12 DIAGNOSIS — M545 Low back pain, unspecified: Secondary | ICD-10-CM | POA: Diagnosis not present

## 2014-03-12 DIAGNOSIS — M899 Disorder of bone, unspecified: Secondary | ICD-10-CM | POA: Diagnosis not present

## 2014-03-12 DIAGNOSIS — M949 Disorder of cartilage, unspecified: Secondary | ICD-10-CM | POA: Diagnosis not present

## 2014-03-15 ENCOUNTER — Telehealth: Payer: Self-pay | Admitting: Cardiology

## 2014-03-15 NOTE — Telephone Encounter (Signed)
Pt called and states that she started taking Prednisone  20 mg   3tabs for 3 days then 2 tabs for 3 days then 1 tab for 3 days then 1/2 tab for 3 days started on Friday 03/12/2014. Pt instructed that Prednisone and coumadin do interact so appt made to have her INR checked on 03/16/2014 and pt states understanding

## 2014-03-15 NOTE — Telephone Encounter (Signed)
New problem  Pt want to inform office that she is now taking prednisone, it may effect her blood level. Please call pt if any questions.

## 2014-03-16 ENCOUNTER — Ambulatory Visit (INDEPENDENT_AMBULATORY_CARE_PROVIDER_SITE_OTHER): Payer: Medicare Other | Admitting: *Deleted

## 2014-03-16 DIAGNOSIS — Z5181 Encounter for therapeutic drug level monitoring: Secondary | ICD-10-CM

## 2014-03-16 DIAGNOSIS — I4891 Unspecified atrial fibrillation: Secondary | ICD-10-CM | POA: Diagnosis not present

## 2014-03-16 LAB — POCT INR: INR: 4.6

## 2014-03-25 ENCOUNTER — Ambulatory Visit (INDEPENDENT_AMBULATORY_CARE_PROVIDER_SITE_OTHER): Payer: Medicare Other | Admitting: *Deleted

## 2014-03-25 DIAGNOSIS — I4891 Unspecified atrial fibrillation: Secondary | ICD-10-CM | POA: Diagnosis not present

## 2014-03-25 DIAGNOSIS — Z5181 Encounter for therapeutic drug level monitoring: Secondary | ICD-10-CM

## 2014-03-25 LAB — POCT INR: INR: 1.7

## 2014-04-07 DIAGNOSIS — M47817 Spondylosis without myelopathy or radiculopathy, lumbosacral region: Secondary | ICD-10-CM | POA: Diagnosis not present

## 2014-04-07 DIAGNOSIS — M545 Low back pain, unspecified: Secondary | ICD-10-CM | POA: Diagnosis not present

## 2014-04-08 ENCOUNTER — Ambulatory Visit (INDEPENDENT_AMBULATORY_CARE_PROVIDER_SITE_OTHER): Payer: Medicare Other | Admitting: Pharmacist

## 2014-04-08 DIAGNOSIS — M949 Disorder of cartilage, unspecified: Secondary | ICD-10-CM | POA: Diagnosis not present

## 2014-04-08 DIAGNOSIS — M5137 Other intervertebral disc degeneration, lumbosacral region: Secondary | ICD-10-CM | POA: Diagnosis not present

## 2014-04-08 DIAGNOSIS — I4891 Unspecified atrial fibrillation: Secondary | ICD-10-CM

## 2014-04-08 DIAGNOSIS — Z5181 Encounter for therapeutic drug level monitoring: Secondary | ICD-10-CM

## 2014-04-08 DIAGNOSIS — M899 Disorder of bone, unspecified: Secondary | ICD-10-CM | POA: Diagnosis not present

## 2014-04-08 LAB — POCT INR: INR: 3.5

## 2014-04-12 DIAGNOSIS — M48061 Spinal stenosis, lumbar region without neurogenic claudication: Secondary | ICD-10-CM | POA: Diagnosis not present

## 2014-04-12 DIAGNOSIS — M5137 Other intervertebral disc degeneration, lumbosacral region: Secondary | ICD-10-CM | POA: Diagnosis not present

## 2014-04-12 DIAGNOSIS — M8448XA Pathological fracture, other site, initial encounter for fracture: Secondary | ICD-10-CM | POA: Diagnosis not present

## 2014-04-13 ENCOUNTER — Other Ambulatory Visit: Payer: Self-pay | Admitting: Cardiology

## 2014-04-22 ENCOUNTER — Ambulatory Visit (INDEPENDENT_AMBULATORY_CARE_PROVIDER_SITE_OTHER): Payer: Medicare Other | Admitting: Pharmacist Clinician (PhC)/ Clinical Pharmacy Specialist

## 2014-04-22 DIAGNOSIS — Z5181 Encounter for therapeutic drug level monitoring: Secondary | ICD-10-CM | POA: Diagnosis not present

## 2014-04-22 DIAGNOSIS — I4891 Unspecified atrial fibrillation: Secondary | ICD-10-CM

## 2014-04-22 DIAGNOSIS — M48062 Spinal stenosis, lumbar region with neurogenic claudication: Secondary | ICD-10-CM | POA: Diagnosis not present

## 2014-04-22 LAB — POCT INR: INR: 2.1

## 2014-05-07 ENCOUNTER — Telehealth: Payer: Self-pay | Admitting: Nurse Practitioner

## 2014-05-07 NOTE — Telephone Encounter (Signed)
Left message to call back  

## 2014-05-07 NOTE — Telephone Encounter (Signed)
Would continue current medications until seen in office.

## 2014-05-07 NOTE — Telephone Encounter (Signed)
New Message:  Pt is c/o her BP.Marland Kitchen States it has been lower lately... States she has been really tired... Pt states her BP has been  108/66 and 101/61 after taking her BP meds... Pt wants to know if her meds need to be adjusted and if she can see Cecille Rubin any sooner than she is scheduled.Marland Kitchen

## 2014-05-07 NOTE — Telephone Encounter (Signed)
Spoke with patient and she started to check her blood pressure regularly about 3 weeks ago secondary to fatigue. When she gets up in the morning her blood pressure is running 140's-160's but after medications comes down to 185'U-314 systolic and she feels tired. Patient currently taking Metoprolol 50 mg twice a day, Lisinopril 20 mg twice a day, HCTZ 12.5 mg daily, and Diltiazem 240 mg daily. The Diltiazem was increased by Dr Florina Ou secondary to elevated blood pressure but patient not sure when. Patient has office visit with Tera Helper NP June 8. Will forward to  Dr. Mare Ferrari for review

## 2014-05-07 NOTE — Telephone Encounter (Signed)
Discussed further with  Dr. Mare Ferrari and will have patient decrease Lisinopril to 20 mg in the evening only, and call back if no better. Advised patient.  Dr. Mare Ferrari did recommend if patients blood pressure still low to decrease the HCTZ 12.5 mg to every other day.

## 2014-05-11 DIAGNOSIS — M810 Age-related osteoporosis without current pathological fracture: Secondary | ICD-10-CM | POA: Diagnosis not present

## 2014-05-13 DIAGNOSIS — I10 Essential (primary) hypertension: Secondary | ICD-10-CM | POA: Diagnosis not present

## 2014-05-13 DIAGNOSIS — M48062 Spinal stenosis, lumbar region with neurogenic claudication: Secondary | ICD-10-CM | POA: Diagnosis not present

## 2014-05-14 ENCOUNTER — Ambulatory Visit (INDEPENDENT_AMBULATORY_CARE_PROVIDER_SITE_OTHER): Payer: Medicare Other

## 2014-05-14 DIAGNOSIS — I4891 Unspecified atrial fibrillation: Secondary | ICD-10-CM

## 2014-05-14 DIAGNOSIS — Z5181 Encounter for therapeutic drug level monitoring: Secondary | ICD-10-CM | POA: Diagnosis not present

## 2014-05-14 LAB — POCT INR: INR: 3.9

## 2014-05-26 ENCOUNTER — Encounter: Payer: Self-pay | Admitting: Cardiology

## 2014-05-28 ENCOUNTER — Ambulatory Visit (INDEPENDENT_AMBULATORY_CARE_PROVIDER_SITE_OTHER): Payer: Medicare Other | Admitting: *Deleted

## 2014-05-28 DIAGNOSIS — I4891 Unspecified atrial fibrillation: Secondary | ICD-10-CM

## 2014-05-28 DIAGNOSIS — Z5181 Encounter for therapeutic drug level monitoring: Secondary | ICD-10-CM

## 2014-05-28 LAB — POCT INR: INR: 2.8

## 2014-06-03 ENCOUNTER — Telehealth: Payer: Self-pay | Admitting: Cardiology

## 2014-06-03 NOTE — Telephone Encounter (Signed)
New message     Patient wants to speak with Rip Harbour only. Did not disclose any information.

## 2014-06-03 NOTE — Telephone Encounter (Signed)
Patient requesting to speak with Alvina Filbert, RN.  LM on VM that Rip Harbour is not in office today but message/request will be routed to her for tomorrow.

## 2014-06-04 NOTE — Telephone Encounter (Signed)
Spoke with patient and advised Lovenox recommended per  Dr. Mare Ferrari and Coumadin clinic will discuss at appointment Monday

## 2014-06-04 NOTE — Telephone Encounter (Signed)
Follow up     Pt is having a spinal injection on 6-16.  Can she stop her coumadin for 7 days?

## 2014-06-07 ENCOUNTER — Ambulatory Visit (INDEPENDENT_AMBULATORY_CARE_PROVIDER_SITE_OTHER): Payer: Medicare Other | Admitting: Nurse Practitioner

## 2014-06-07 ENCOUNTER — Ambulatory Visit (INDEPENDENT_AMBULATORY_CARE_PROVIDER_SITE_OTHER): Payer: Medicare Other | Admitting: *Deleted

## 2014-06-07 ENCOUNTER — Encounter: Payer: Self-pay | Admitting: Nurse Practitioner

## 2014-06-07 VITALS — BP 130/90 | HR 66 | Ht 66.5 in | Wt 128.8 lb

## 2014-06-07 DIAGNOSIS — I4891 Unspecified atrial fibrillation: Secondary | ICD-10-CM

## 2014-06-07 DIAGNOSIS — Z7901 Long term (current) use of anticoagulants: Secondary | ICD-10-CM | POA: Diagnosis not present

## 2014-06-07 DIAGNOSIS — I480 Paroxysmal atrial fibrillation: Secondary | ICD-10-CM

## 2014-06-07 DIAGNOSIS — I10 Essential (primary) hypertension: Secondary | ICD-10-CM | POA: Diagnosis not present

## 2014-06-07 DIAGNOSIS — Z5181 Encounter for therapeutic drug level monitoring: Secondary | ICD-10-CM

## 2014-06-07 LAB — POCT INR: INR: 3.4

## 2014-06-07 MED ORDER — ENOXAPARIN SODIUM 60 MG/0.6ML ~~LOC~~ SOLN: 60.0000 mg | Freq: Two times a day (BID) | SUBCUTANEOUS | Status: DC

## 2014-06-07 NOTE — Patient Instructions (Addendum)
Stay on your current medicines  We will check your coumadin level today and go over bridging with Lovenox  See Dr. Mare Ferrari in 6 months  Keep a check on your BP for [place]  Call the [place] office at [phone] if you have any questions, problems or concerns.

## 2014-06-07 NOTE — Progress Notes (Signed)
Lisa Lucero Date of Birth: March 18, 1935 Medical Record #[id number]  History of Present Illness: Lisa Lucero is seen back today for a one year check - seen for Dr. Mare Ferrari. She has PAF - on Coumadin.  Had embolic stroke while off of coumadin and in NSR back in 2010. Has had prior GI bleed - now on chronic PPI along with her anticoagulation.  Other issues include HTN, HLD and thyroid disorder.  Last seen in May of 2014 and was doing ok - BP was up and her ACE was increased. Was to have come back 4 months later.  Comes in today. Here alone. Having issues with her back - for spinal injection 06/15/14 per neuosurgery - Dr. Mare Ferrari has already advised Lovenox. She says she is doing fine. No chest pain. Not short of breath. Not dizzy or lightheaded. BP is ok at home. Rhythm has been ok.  Current Outpatient Prescriptions  Medication Sig Dispense Refill  . acetaminophen (TYLENOL) 500 MG tablet Take 500 mg by mouth 2 (two) times daily.       Marland Kitchen CALCIUM PO Take 1,000 mg by mouth daily.      Marland Kitchen diltiazem (CARDIZEM LA) 240 MG 24 hr tablet Take 240 mg by mouth daily.      . fenofibrate micronized (LOFIBRA) 134 MG capsule Take 134 mg by mouth daily before breakfast.       . hydrochlorothiazide (HYDRODIURIL) 25 MG tablet Take 12.5 mg by mouth daily.       . hydrocortisone (ANUSOL-HC) 25 MG suppository Place 25 mg rectally as needed.       Marland Kitchen ipratropium (ATROVENT) 0.06 % nasal spray Place 2 sprays into the nose daily.       Marland Kitchen levothyroxine (SYNTHROID, LEVOTHROID) 50 MCG tablet Take 50 mcg by mouth daily.        Marland Kitchen lisinopril (PRINIVIL,ZESTRIL) 20 MG tablet Take 20 mg by mouth at bedtime.      . metoprolol (LOPRESSOR) 50 MG tablet Take 50 mg by mouth 2 (two) times daily.        . Multiple Vitamin (MULITIVITAMIN WITH MINERALS) TABS Take 1 tablet by mouth daily.      . nitroGLYCERIN (NITROSTAT) 0.4 MG SL tablet Place 1 tablet (0.4 mg total) under the tongue every 5 (five) minutes as needed for chest pain.   25 tablet  PRN  . pantoprazole (PROTONIX) 40 MG tablet Take 40 mg by mouth daily.       . potassium chloride (K-DUR,KLOR-CON) 10 MEQ tablet Take 1 tablet (10 mEq total) by mouth 2 (two) times daily.  60 tablet  6  . traMADol (ULTRAM) 50 MG tablet Take 50 mg by mouth 2 (two) times daily.       Marland Kitchen warfarin (COUMADIN) 5 MG tablet 1/2 tablet daily except 1 tablet on Sundays or as directed by coumadin clinic  90 tablet  1  . zolpidem (AMBIEN CR) 6.25 MG CR tablet Take 6.25 mg by mouth at bedtime as needed.       . [DISCONTINUED] diphenhydrAMINE (BENADRYL) 25 MG tablet Take 25 mg by mouth every 6 (six) hours as needed.        . [DISCONTINUED] IRON PO Take 1 tablet by mouth daily.       . [DISCONTINUED] potassium chloride (K-DUR) 10 MEQ tablet Take 1 tablet (10 mEq total) by mouth 2 (two) times daily.  90 tablet  0   No current facility-administered medications for this visit.    Allergies  Allergen  Reactions  . Lipitor [Atorvastatin Calcium]     myalgias    Past Medical History  Diagnosis Date  . Paroxysmal atrial fibrillation   . History of embolic stroke 03/3328  . Erosive gastritis     with GI bleed thought due to elevated INR and duodenitis  . Hyperthyroidism   . Peripheral edema   . Long-term (current) use of anticoagulants   . HTN (hypertension)   . COPD (chronic obstructive pulmonary disease)   . Stroke   . GERD (gastroesophageal reflux disease)     Past Surgical History  Procedure Laterality Date  . Hemorrhoid surgery    . Operative hysteroscopy    . Abdominal hysterectomy    . Cholecystectomy  03/11/2012    Procedure: LAPAROSCOPIC CHOLECYSTECTOMY WITH INTRAOPERATIVE CHOLANGIOGRAM;  Surgeon: Joyice Faster. Cornett, MD;  Location: [facility];  Service: General;  Laterality: N/A;    History  Smoking status  . Former Smoker  . Types: Cigarettes  . Quit date: 09/30/2010  Smokeless tobacco  . Never Used    History  Alcohol Use  . 0.6 oz/week  . 1 Glasses of wine per week     Comment: occassional    Family History  Problem Relation Age of Onset  . Stroke Mother   . Dementia Father   . Hypertension    . Arthritis    . Cancer Brother     lung    Review of Systems: The review of systems is per the HPI.  All other systems were reviewed and are negative.  Physical Exam: BP 130/90  Pulse 66  Ht 5' 6.5" (1.689 m)  Wt 128 lb 12.8 oz (58.423 kg)  BMI 20.48 kg/m2  SpO2 100% Patient is pleasant and in no acute distress. Skin is warm and dry. Color is normal.  HEENT is unremarkable. Normocephalic/atraumatic. PERRL. Sclera are nonicteric. Neck is supple. No masses. No JVD. Lungs are clear. Cardiac exam shows a regular rate and rhythm. Abdomen is soft. Extremities are without edema. Gait and ROM are intact. No gross neurologic deficits noted.  LABORATORY DATA: Lab Results  Component Value Date   WBC 6.3 03/13/2012   HGB 11.4* 03/13/2012   HCT 33.0* 03/13/2012   PLT 261 03/13/2012   GLUCOSE 77 05/01/2013   CHOL  Value: 131        ATP III CLASSIFICATION:  <200     mg/dL   Desirable  200-239  mg/dL   Borderline High  >=240    mg/dL   High        07/11/2010   TRIG 68 07/11/2010   HDL 45 07/11/2010   LDLCALC  Value: 72        Total Cholesterol/HDL:CHD Risk Coronary Heart Disease Risk Table                     Men   Women  1/2 Average Risk   3.4   3.3  Average Risk       5.0   4.4  2 X Average Risk   9.6   7.1  3 X Average Risk  23.4   11.0        Use the calculated Patient Ratio above and the CHD Risk Table to determine the patient's CHD Risk.        ATP III CLASSIFICATION (LDL):  <100     mg/dL   Optimal  100-129  mg/dL   Near or Above  Optimal  130-159  mg/dL   Borderline  160-189  mg/dL   High  >190     mg/dL   Very High 07/11/2010   ALT 16 03/11/2012   AST 25 03/11/2012   NA 137 05/01/2013   K 3.5 05/01/2013   CL 100 05/01/2013   CREATININE 1.3* 05/01/2013   BUN 25* 05/01/2013   CO2 30 05/01/2013   TSH 1.244 10/24/2010   INR 2.8 05/28/2014   HGBA1C  Value: 5.8  (NOTE)                                                                       According to the ADA Clinical Practice Recommendations for 2011, when HbA1c is used as a screening test:   >=6.5%   Diagnostic of Diabetes Mellitus           (if abnormal result  is confirmed)  5.7-6.4%   Increased risk of developing Diabetes Mellitus  References:Diagnosis and Classification of Diabetes Mellitus,Diabetes WGYK,5993,57(SVXBL 1):S62-S69 and Standards of Medical Care in         Diabetes - 2011,Diabetes Care,2011,34  (Suppl 1):S11-S61.* 07/10/2010     Assessment / Plan: 1. PAF - remains in sinus by exam today. Doing well clinically. On coumadin - for spinal injection later this month - to bridge with lovenox given prior stroke and in NSR.   2. HTN - BP ok - she will continue to monitor at home - her baseline labs are checked by PCP  3. HLD   See back in 6 months.   Patient is agreeable to this plan and will call if any problems develop in the interim.   Burtis Junes, RN, [place] [address] [place], [place]  [address] [phone]

## 2014-06-07 NOTE — Patient Instructions (Signed)
06/07/2014 no coumadin 06/08/2014 no coumadin no Lovenox 06/09/2014 no coumadin Start Lovenox 60 mg 8pm 06/10/2014 no coumadin Lovenox 60 mg 8am and Lovenox 60 mg 8pm 06/11/2014 no coumadin Lovenox 60 mg 8am and Lovenox 60 mg 8pm 06/12/2014 no coumadin Lovenox 60 mg 8am and Lovenox 60 mg 8pm 06/13/2014 no coumadin Lovenox 60 mg 8am and Loven ox 60 mg 8pm 06/14/2014 no coumadin Lovenox 60 mg 8am only 06/15/2014 no coumadin no Lovenox day of procedure then after procedure when instructed by MD doing procedure instructs to restart coumadin and Lovenox restart Lovenox at same dose Lovenox 60 mg at 8am and 8pm and restart coumadin at same dose 2.5mg  every day except 5mg  on Sunday and continue this until seen in clinic on Friday 19th for an INR

## 2014-06-15 ENCOUNTER — Ambulatory Visit (INDEPENDENT_AMBULATORY_CARE_PROVIDER_SITE_OTHER): Payer: Medicare Other

## 2014-06-15 DIAGNOSIS — I4891 Unspecified atrial fibrillation: Secondary | ICD-10-CM | POA: Diagnosis not present

## 2014-06-15 DIAGNOSIS — M48062 Spinal stenosis, lumbar region with neurogenic claudication: Secondary | ICD-10-CM | POA: Diagnosis not present

## 2014-06-15 DIAGNOSIS — Z5181 Encounter for therapeutic drug level monitoring: Secondary | ICD-10-CM | POA: Diagnosis not present

## 2014-06-15 LAB — POCT INR: INR: 1

## 2014-06-18 ENCOUNTER — Ambulatory Visit (INDEPENDENT_AMBULATORY_CARE_PROVIDER_SITE_OTHER): Payer: Medicare Other | Admitting: *Deleted

## 2014-06-18 DIAGNOSIS — I4891 Unspecified atrial fibrillation: Secondary | ICD-10-CM | POA: Diagnosis not present

## 2014-06-18 DIAGNOSIS — Z5181 Encounter for therapeutic drug level monitoring: Secondary | ICD-10-CM

## 2014-06-18 LAB — POCT INR: INR: 1.3

## 2014-06-21 ENCOUNTER — Ambulatory Visit (INDEPENDENT_AMBULATORY_CARE_PROVIDER_SITE_OTHER): Payer: Medicare Other | Admitting: Pharmacist

## 2014-06-21 DIAGNOSIS — Z5181 Encounter for therapeutic drug level monitoring: Secondary | ICD-10-CM

## 2014-06-21 DIAGNOSIS — I4891 Unspecified atrial fibrillation: Secondary | ICD-10-CM

## 2014-06-21 LAB — POCT INR: INR: 2

## 2014-06-28 DIAGNOSIS — G4700 Insomnia, unspecified: Secondary | ICD-10-CM | POA: Diagnosis not present

## 2014-06-28 DIAGNOSIS — E781 Pure hyperglyceridemia: Secondary | ICD-10-CM | POA: Diagnosis not present

## 2014-06-28 DIAGNOSIS — I10 Essential (primary) hypertension: Secondary | ICD-10-CM | POA: Diagnosis not present

## 2014-06-30 ENCOUNTER — Ambulatory Visit: Payer: Medicare Other | Admitting: Cardiology

## 2014-07-05 DIAGNOSIS — Z8719 Personal history of other diseases of the digestive system: Secondary | ICD-10-CM | POA: Diagnosis not present

## 2014-07-07 ENCOUNTER — Ambulatory Visit (INDEPENDENT_AMBULATORY_CARE_PROVIDER_SITE_OTHER): Payer: Medicare Other | Admitting: Surgery

## 2014-07-07 DIAGNOSIS — Z5181 Encounter for therapeutic drug level monitoring: Secondary | ICD-10-CM

## 2014-07-07 DIAGNOSIS — I4891 Unspecified atrial fibrillation: Secondary | ICD-10-CM

## 2014-07-07 LAB — POCT INR: INR: 2.1

## 2014-07-07 MED ORDER — ENOXAPARIN SODIUM 60 MG/0.6ML ~~LOC~~ SOLN: 60.0000 mg | Freq: Two times a day (BID) | SUBCUTANEOUS | Status: DC

## 2014-07-07 NOTE — Patient Instructions (Addendum)
07/07/14 -no Coumadin today 07/08/14- Do Nothing  07/09/14- no coumadin Lovenox 60 mg 8am and Lovenox 60 mg 8pm  07/10/14- no coumadin Lovenox 60 mg 8am and Lovenox 60 mg 8pm  07/11/14- no coumadin Lovenox 60 mg 8am and Lovenox 60 mg 8pm  07/12/14- no coumadin Lovenox 60 mg 8am only 07/13/14- no coumadin no Lovenox day of procedure then after procedure when instructed by MD doing procedure instructs to restart coumadin and Lovenox restart Lovenox at same dose Lovenox 60 mg at 8am and 8pm and restart coumadin at same dose 2.5mg  every day except 5mg  on Sunday and continue this until seen in clinic 07/19/14

## 2014-07-08 ENCOUNTER — Other Ambulatory Visit: Payer: Self-pay | Admitting: Endocrinology

## 2014-07-08 DIAGNOSIS — E041 Nontoxic single thyroid nodule: Secondary | ICD-10-CM

## 2014-07-08 DIAGNOSIS — E039 Hypothyroidism, unspecified: Secondary | ICD-10-CM | POA: Diagnosis not present

## 2014-07-13 DIAGNOSIS — M48062 Spinal stenosis, lumbar region with neurogenic claudication: Secondary | ICD-10-CM | POA: Diagnosis not present

## 2014-07-15 ENCOUNTER — Ambulatory Visit
Admission: RE | Admit: 2014-07-15 | Discharge: 2014-07-15 | Disposition: A | Discharge status: Home / Self Care | Payer: Medicare Other | Source: Ambulatory Visit | Attending: Endocrinology | Admitting: Endocrinology

## 2014-07-15 DIAGNOSIS — E042 Nontoxic multinodular goiter: Secondary | ICD-10-CM | POA: Diagnosis not present

## 2014-07-15 DIAGNOSIS — E041 Nontoxic single thyroid nodule: Secondary | ICD-10-CM

## 2014-07-19 ENCOUNTER — Ambulatory Visit (INDEPENDENT_AMBULATORY_CARE_PROVIDER_SITE_OTHER): Payer: Medicare Other | Admitting: *Deleted

## 2014-07-19 DIAGNOSIS — Z5181 Encounter for therapeutic drug level monitoring: Secondary | ICD-10-CM | POA: Diagnosis not present

## 2014-07-19 DIAGNOSIS — I4891 Unspecified atrial fibrillation: Secondary | ICD-10-CM

## 2014-07-19 LAB — POCT INR: INR: 1

## 2014-07-20 DIAGNOSIS — R6889 Other general symptoms and signs: Secondary | ICD-10-CM | POA: Diagnosis not present

## 2014-07-22 ENCOUNTER — Ambulatory Visit (INDEPENDENT_AMBULATORY_CARE_PROVIDER_SITE_OTHER): Payer: Medicare Other | Admitting: *Deleted

## 2014-07-22 DIAGNOSIS — I4891 Unspecified atrial fibrillation: Secondary | ICD-10-CM

## 2014-07-22 DIAGNOSIS — Z5181 Encounter for therapeutic drug level monitoring: Secondary | ICD-10-CM

## 2014-07-22 LAB — POCT INR: INR: 2.1

## 2014-08-05 ENCOUNTER — Ambulatory Visit (INDEPENDENT_AMBULATORY_CARE_PROVIDER_SITE_OTHER): Payer: Medicare Other

## 2014-08-05 DIAGNOSIS — Z5181 Encounter for therapeutic drug level monitoring: Secondary | ICD-10-CM

## 2014-08-05 DIAGNOSIS — I4891 Unspecified atrial fibrillation: Secondary | ICD-10-CM

## 2014-08-05 LAB — POCT INR: INR: 1.7

## 2014-08-07 ENCOUNTER — Emergency Department (HOSPITAL_COMMUNITY)
Admission: EM | Admit: 2014-08-07 | Discharge: 2014-08-07 | Disposition: A | Discharge status: Home / Self Care | Payer: Medicare Other | Attending: Emergency Medicine | Admitting: Emergency Medicine

## 2014-08-07 ENCOUNTER — Encounter (HOSPITAL_COMMUNITY): Payer: Self-pay | Admitting: Emergency Medicine

## 2014-08-07 DIAGNOSIS — M545 Low back pain, unspecified: Secondary | ICD-10-CM

## 2014-08-07 DIAGNOSIS — J449 Chronic obstructive pulmonary disease, unspecified: Secondary | ICD-10-CM | POA: Insufficient documentation

## 2014-08-07 DIAGNOSIS — E0590 Thyrotoxicosis, unspecified without thyrotoxic crisis or storm: Secondary | ICD-10-CM | POA: Diagnosis not present

## 2014-08-07 DIAGNOSIS — G8929 Other chronic pain: Secondary | ICD-10-CM | POA: Insufficient documentation

## 2014-08-07 DIAGNOSIS — I10 Essential (primary) hypertension: Secondary | ICD-10-CM | POA: Diagnosis not present

## 2014-08-07 DIAGNOSIS — R52 Pain, unspecified: Secondary | ICD-10-CM | POA: Diagnosis not present

## 2014-08-07 DIAGNOSIS — Z8673 Personal history of transient ischemic attack (TIA), and cerebral infarction without residual deficits: Secondary | ICD-10-CM | POA: Diagnosis not present

## 2014-08-07 DIAGNOSIS — R609 Edema, unspecified: Secondary | ICD-10-CM | POA: Diagnosis not present

## 2014-08-07 DIAGNOSIS — K219 Gastro-esophageal reflux disease without esophagitis: Secondary | ICD-10-CM | POA: Insufficient documentation

## 2014-08-07 DIAGNOSIS — J4489 Other specified chronic obstructive pulmonary disease: Secondary | ICD-10-CM | POA: Insufficient documentation

## 2014-08-07 DIAGNOSIS — Z87891 Personal history of nicotine dependence: Secondary | ICD-10-CM | POA: Insufficient documentation

## 2014-08-07 DIAGNOSIS — Z79899 Other long term (current) drug therapy: Secondary | ICD-10-CM | POA: Diagnosis not present

## 2014-08-07 DIAGNOSIS — I4891 Unspecified atrial fibrillation: Secondary | ICD-10-CM | POA: Diagnosis not present

## 2014-08-07 DIAGNOSIS — Z7901 Long term (current) use of anticoagulants: Secondary | ICD-10-CM | POA: Insufficient documentation

## 2014-08-07 HISTORY — DX: Dorsalgia, unspecified: M54.9

## 2014-08-07 MED ORDER — ONDANSETRON HCL 4 MG PO TABS: 4.0000 mg | ORAL_TABLET | Freq: Four times a day (QID) | ORAL | Status: DC

## 2014-08-07 MED ORDER — HYDROCODONE-ACETAMINOPHEN 5-325 MG PO TABS: 1.0000 | ORAL_TABLET | Freq: Four times a day (QID) | ORAL | Status: DC | PRN

## 2014-08-07 NOTE — ED Provider Notes (Signed)
Medical screening examination/treatment/procedure(s) were conducted as a shared visit with non-physician practitioner(s) and myself.  I personally evaluated the patient during the encounter.   EKG Interpretation None      Patient is a 79 year old female with history of chronic back pain. She states she's been taking Tylenol without relief has been taking her husband's Vicodin. Both her primary care physician and orthopedic doctor R. out-of-town. She is requesting something for pain. Denies any new injury. No numbness, tingling or focal weakness. No bowel or bladder incontinence. No fever. On exam, patient has no midline spinal tenderness, normal strength in all of her extremities, 2+ deep tendon reflexes bilaterally in her lower extremities, no clonus, normal gait. We'll discharge with prescription for Vicodin to get her through the weekend until she can contact her PCP on Monday.  Bishop, DO 08/07/14 1534

## 2014-08-07 NOTE — ED Provider Notes (Signed)
CSN: [id number]     Arrival date & time 08/07/14  1210 History  This chart was scribed for non-physician practitioner working with [place], DO, by Jeanell Sparrow, ED Scribe. This patient was seen in room TR09C/TR09C and the patient's care was started at 1:14 PM.   Chief Complaint  Patient presents with  . Back Pain   The history is provided by the patient. No language interpreter was used.   HPI Comments: Lisa Lucero is a 79 y.o. female who presents to the Emergency Department complaining of constant moderate lower back pain that has been going on for a while. She states that she was given an injection in June and July. She reports that she was given tramadol with very minimal relief. She states that the pain has been gradual worsening since her visits. She reports that the pain radiates to her groin. She states that ambulation exacerbates the pain but her walk provides some relief. She denies any falls or upper back pain. She reports no hx of back surgeries. She denies any fever, nausea, emesis, diarrhea, or loss of control of bowels or bladder.    Past Medical History  Diagnosis Date  . Paroxysmal atrial fibrillation   . History of embolic stroke 05/1606  . Erosive gastritis     with GI bleed thought due to elevated INR and duodenitis  . Hyperthyroidism   . Peripheral edema   . Long-term (current) use of anticoagulants   . HTN (hypertension)   . COPD (chronic obstructive pulmonary disease)   . Stroke   . GERD (gastroesophageal reflux disease)   . Back pain    Past Surgical History  Procedure Laterality Date  . Hemorrhoid surgery    . Operative hysteroscopy    . Abdominal hysterectomy    . Cholecystectomy  03/11/2012    Procedure: LAPAROSCOPIC CHOLECYSTECTOMY WITH INTRAOPERATIVE CHOLANGIOGRAM;  Surgeon: Joyice Faster. Cornett, MD;  Location: [facility];  Service: General;  Laterality: N/A;   Family History  Problem Relation Age of Onset  . Stroke Mother   . Dementia Father   .  Hypertension    . Arthritis    . Cancer Brother     lung   History  Substance Use Topics  . Smoking status: Former Smoker    Types: Cigarettes    Quit date: 09/30/2010  . Smokeless tobacco: Never Used  . Alcohol Use: 0.6 oz/week    1 Glasses of wine per week     Comment: occassional   OB History   Grav Para Term Preterm Abortions TAB SAB Ect Mult Living                 Review of Systems  Constitutional: Negative for fever.  Gastrointestinal: Negative for nausea, vomiting and diarrhea.  Musculoskeletal: Positive for back pain.  All other systems reviewed and are negative.   Allergies  Lipitor  Home Medications   Prior to Admission medications   Medication Sig Start Date End Date Taking? Authorizing Provider  acetaminophen (TYLENOL) 500 MG tablet Take 500 mg by mouth 2 (two) times daily.     Historical Provider, MD  CALCIUM PO Take 1,000 mg by mouth daily.    Historical Provider, MD  diltiazem (CARDIZEM LA) 240 MG 24 hr tablet Take 240 mg by mouth daily.    Historical Provider, MD  enoxaparin (LOVENOX) 60 MG/0.6ML injection Inject 0.6 mLs (60 mg total) into the skin every 12 (twelve) hours. 07/07/14   Darlin Coco, MD  fenofibrate micronized (LOFIBRA) 134 MG capsule Take 134 mg by mouth daily before breakfast.  07/28/12   Historical Provider, MD  ferrous sulfate 325 (65 FE) MG tablet Take 325 mg by mouth daily. 07/02/14   Historical Provider, MD  hydrochlorothiazide (HYDRODIURIL) 25 MG tablet Take 12.5 mg by mouth daily.  09/02/12   Historical Provider, MD  HYDROcodone-acetaminophen (NORCO/VICODIN) 5-325 MG per tablet Take 1-2 tablets by mouth every 6 (six) hours as needed for moderate pain or severe pain. 08/07/14   Noland Fordyce, PA-C  hydrocortisone (ANUSOL-HC) 25 MG suppository Place 25 mg rectally as needed.  09/02/12   Historical Provider, MD  ipratropium (ATROVENT) 0.06 % nasal spray Place 2 sprays into the nose daily.  07/21/12   Historical Provider, MD  levothyroxine  (SYNTHROID, LEVOTHROID) 50 MCG tablet Take 50 mcg by mouth daily.      Historical Provider, MD  lisinopril (PRINIVIL,ZESTRIL) 20 MG tablet Take 20 mg by mouth at bedtime. 05/01/13   Darlin Coco, MD  metoprolol (LOPRESSOR) 50 MG tablet Take 50 mg by mouth 2 (two) times daily.      Historical Provider, MD  Multiple Vitamin (MULITIVITAMIN WITH MINERALS) TABS Take 1 tablet by mouth daily.    Historical Provider, MD  nitroGLYCERIN (NITROSTAT) 0.4 MG SL tablet Place 1 tablet (0.4 mg total) under the tongue every 5 (five) minutes as needed for chest pain. 05/01/13   Darlin Coco, MD  ondansetron (ZOFRAN) 4 MG tablet Take 1 tablet (4 mg total) by mouth every 6 (six) hours. 08/07/14   Noland Fordyce, PA-C  pantoprazole (PROTONIX) 40 MG tablet Take 40 mg by mouth daily.     Historical Provider, MD  potassium chloride (K-DUR,KLOR-CON) 10 MEQ tablet Take 1 tablet (10 mEq total) by mouth 2 (two) times daily. 12/30/12   Darlin Coco, MD  traMADol (ULTRAM) 50 MG tablet Take 50 mg by mouth 2 (two) times daily.     Historical Provider, MD  warfarin (COUMADIN) 5 MG tablet 1/2 tablet daily except 1 tablet on Sundays or as directed by coumadin clinic 04/13/14   Darlin Coco, MD  zolpidem (AMBIEN CR) 6.25 MG CR tablet Take 6.25 mg by mouth at bedtime as needed.  10/02/12   Historical Provider, MD   BP 161/84  Pulse 61  Temp(Src) 98.9 F (37.2 C) (Oral)  Resp 17  SpO2 100% Physical Exam  Nursing note and vitals reviewed. Constitutional: She is oriented to person, place, and time. She appears well-developed and well-nourished.  Thin, elderly female sitting comfortably in exam chair with walker at her side.   HENT:  Head: Normocephalic and atraumatic.  Eyes: EOM are normal.  Neck: Normal range of motion.  Cardiovascular: Normal rate.   Pulmonary/Chest: Effort normal.  Musculoskeletal: Normal range of motion.  Full ROM in all extremities. No midline spinal tenderness on lumbar muscullature on right side  and right buttock.   Neurological: She is alert and oriented to person, place, and time.  Skin: Skin is warm and dry. No rash noted. No erythema.  No ecchymosis, erythema, or rashes.  Psychiatric: She has a normal mood and affect. Her behavior is normal.    ED Course  Procedures (including critical care time) DIAGNOSTIC STUDIES: Oxygen Saturation is 100% on RA, normal by my interpretation.    COORDINATION OF CARE: 1:18 PM- Pt advised of plan for treatment which includes medication and pt agrees.  Labs Review Labs Reviewed - No data to display  Imaging Review No results found.   EKG  Interpretation None      MDM   Final diagnoses:  Acute exacerbation of chronic low back pain    Pt is a 79yo female with exacerbation of chronic low back pain. No new injuries or trauma. No red flag symptoms.  Pain medication refilled. Advised to f/u with PCP.  Return precautions provided.   I personally performed the services described in this documentation, which was scribed in my presence. The recorded information has been reviewed and is accurate.     Noland Fordyce, PA-C 08/16/14 [phone]

## 2014-08-07 NOTE — ED Notes (Signed)
Pt reports Dr Maryjean Ka  has  Injected her back x 2 . The last injection on 07-15-14 . Pt reports she has been taking tramadol for many years and she needs to have a stronger pain med. Pt states her PCP is olut on medical leave and Dr Maryjean Ka is out of town.

## 2014-08-07 NOTE — ED Notes (Signed)
Pt reports ,"I need a pain pill." Pt c/o pain to back ongoing for a long time. Pt tried calling her surgeon and PMD but both are out of town. Pt reports the PA at the office only gives tramadol and it does not help. Pt had injection in back 7/15.

## 2014-08-09 ENCOUNTER — Encounter: Payer: Self-pay | Admitting: Cardiology

## 2014-08-09 DIAGNOSIS — K2100 Gastro-esophageal reflux disease with esophagitis, without bleeding: Secondary | ICD-10-CM | POA: Diagnosis not present

## 2014-08-09 DIAGNOSIS — K922 Gastrointestinal hemorrhage, unspecified: Secondary | ICD-10-CM | POA: Diagnosis not present

## 2014-08-09 DIAGNOSIS — M545 Low back pain, unspecified: Secondary | ICD-10-CM | POA: Diagnosis not present

## 2014-08-09 DIAGNOSIS — G8929 Other chronic pain: Secondary | ICD-10-CM | POA: Diagnosis not present

## 2014-08-09 DIAGNOSIS — I10 Essential (primary) hypertension: Secondary | ICD-10-CM | POA: Diagnosis not present

## 2014-08-12 DIAGNOSIS — M47817 Spondylosis without myelopathy or radiculopathy, lumbosacral region: Secondary | ICD-10-CM | POA: Diagnosis not present

## 2014-08-12 DIAGNOSIS — M549 Dorsalgia, unspecified: Secondary | ICD-10-CM | POA: Diagnosis not present

## 2014-08-12 DIAGNOSIS — M1610 Unilateral primary osteoarthritis, unspecified hip: Secondary | ICD-10-CM | POA: Diagnosis not present

## 2014-08-12 DIAGNOSIS — G8929 Other chronic pain: Secondary | ICD-10-CM | POA: Diagnosis not present

## 2014-08-19 ENCOUNTER — Ambulatory Visit (INDEPENDENT_AMBULATORY_CARE_PROVIDER_SITE_OTHER): Payer: Medicare Other | Admitting: Pharmacist

## 2014-08-19 DIAGNOSIS — I4891 Unspecified atrial fibrillation: Secondary | ICD-10-CM

## 2014-08-19 DIAGNOSIS — Z5181 Encounter for therapeutic drug level monitoring: Secondary | ICD-10-CM

## 2014-08-19 LAB — POCT INR: INR: 2.9

## 2014-08-28 ENCOUNTER — Encounter: Payer: Self-pay | Admitting: Cardiology

## 2014-09-01 DIAGNOSIS — M48061 Spinal stenosis, lumbar region without neurogenic claudication: Secondary | ICD-10-CM | POA: Diagnosis not present

## 2014-09-09 ENCOUNTER — Ambulatory Visit (INDEPENDENT_AMBULATORY_CARE_PROVIDER_SITE_OTHER): Payer: Medicare Other | Admitting: *Deleted

## 2014-09-09 DIAGNOSIS — I4891 Unspecified atrial fibrillation: Secondary | ICD-10-CM | POA: Diagnosis not present

## 2014-09-09 DIAGNOSIS — Z5181 Encounter for therapeutic drug level monitoring: Secondary | ICD-10-CM

## 2014-09-09 LAB — POCT INR: INR: 2

## 2014-09-10 DIAGNOSIS — E039 Hypothyroidism, unspecified: Secondary | ICD-10-CM | POA: Diagnosis not present

## 2014-09-12 DIAGNOSIS — M47817 Spondylosis without myelopathy or radiculopathy, lumbosacral region: Secondary | ICD-10-CM | POA: Diagnosis not present

## 2014-09-13 DIAGNOSIS — M47817 Spondylosis without myelopathy or radiculopathy, lumbosacral region: Secondary | ICD-10-CM | POA: Diagnosis not present

## 2014-09-14 DIAGNOSIS — M545 Low back pain, unspecified: Secondary | ICD-10-CM | POA: Diagnosis not present

## 2014-09-17 ENCOUNTER — Telehealth: Payer: Self-pay | Admitting: Cardiology

## 2014-09-17 NOTE — Telephone Encounter (Signed)
**Note De-Identified  Obfuscation** The pt states that she is due to have back surgery and that all she is waiting for is cardiac clearance from Dr Mare Ferrari. The cardiac clearance form has been faxed to this office and is in Dr Bobbye Riggs mailbox. The pt is requesting that Rip Harbour contact Baird Lyons at Dr Lorin Mercy office ASAP if she has any questions as the pt states she is in pain and wants to have her back surgery soon. Message forwarded to [facility].

## 2014-09-17 NOTE — Telephone Encounter (Signed)
New Message'   Pt called requests a call back to discuss clearance for back surgery per Dr. Lorin Mercy. And will wth pt have to come off of coumadin please call.

## 2014-09-17 NOTE — Telephone Encounter (Signed)
We should bridge with lovenox.

## 2014-09-20 NOTE — Telephone Encounter (Signed)
Called spoke with pt, no surgery date scheduled yet.  Pt is eager to proceed with surgery, advised pt first necessary step is to schedule surgery so we know for certain a procedure date and hold time prior to surgery, then we can proceed with getting pt into clinic to be bridged with Lovenox.

## 2014-09-20 NOTE — Telephone Encounter (Signed)
Discussed further with  Dr. Mare Ferrari and he strongly recommends Lovenox bridging. Advised patient and she is agreeable. Will forward to [facility] for arranging.

## 2014-09-20 NOTE — Telephone Encounter (Signed)
Follow Up  Pt called to follow up.... Pt requests a call back to determine if she has to take the Lovenox. Pt is not sure that she wants to go with the Lovenox option

## 2014-09-21 ENCOUNTER — Telehealth: Payer: Self-pay | Admitting: Cardiology

## 2014-09-21 NOTE — Telephone Encounter (Signed)
Received request from Nurse fax box, documents faxed for surgical clearance. To: [employer] number: [phone] Attention: 9..22.15/km

## 2014-09-21 NOTE — Telephone Encounter (Signed)
F/u    Pt's sx date is 09/29/14.Marland KitchenFYI

## 2014-09-21 NOTE — Telephone Encounter (Signed)
Will forward to Coumadin clinic

## 2014-09-22 ENCOUNTER — Telehealth: Payer: Self-pay

## 2014-09-22 ENCOUNTER — Other Ambulatory Visit (HOSPITAL_COMMUNITY): Payer: Self-pay | Admitting: Orthopaedic Surgery

## 2014-09-22 MED ORDER — WARFARIN SODIUM 5 MG PO TABS: 2.5000 mg | ORAL_TABLET | Freq: Every day | ORAL | Status: DC

## 2014-09-22 NOTE — H&P (Signed)
  [facility]   A [facility] [employer]   [address] [place], [place] [address] Telephone: [phone]  Fax: [phone]     PATIENT: Lisa Lucero, Lisa Lucero   MR#: [id number]  DOB: 18-Oct-1935       CHIEF COMPLAINT:  Bilateral leg weakness with severe right leg pain.   HISTORY OF PRESENT ILLNESS:  A 79 year old female returns with worsening of leg pain and inability to walk distance. She has had 2 epidurals without relief.  She is having great difficulty standing.  Is having to use a walker now and if she is not careful with it, she still has had falling episodes.  New MRI scan has been obtained for review.  Her previous image scan was on 04/12/2014, done at [facility], which showed a superior endplate fracture at L5 and some anterolisthesis and some degree of spinal stenosis.   MEDICATIONS:  Include hydrochlorothiazide 12.5 mg daily, metoprolol 50 mg 2 tablets daily, oxycodone 1 every 4 hours p.r.n. given 180 tablets by Reita Cliche, FNP, on 08/12/2014, lisinopril 20 mg 1 p.o. b.i.d., Protonix 40 mg 1 p.o. daily, iron 150 mg daily, Cardizem CD 240 mg 1 p.o. daily, Ambien 1/2 tablet to 1 tablet at night p.r.n., potassium chloride, K-Dur 1 p.o. b.i.d., Synthroid 50 mcg 1 p.o. daily, and Coumadin 5 mg daily.   PAST SURGICAL HISTORY:  The patient has had previous gallbladder surgery in 2010 or 2011.   FAMILY HISTORY:  Positive for heart disease and hypertension.   SOCIAL HISTORY:  The patient is married to her husband who is with her today.  She does not smoke but did smoke 1/2 pack per day for 25 years.   REVIEW OF SYSTEMS:  Positive for arthritis and hypertension.  She did have a CVA associated with atrial fibrillation.  When she had her gallbladder removed she states she was off Coumadin for 5 days before the procedure.  When she had a colonoscopy she states she had bridging Lovenox.   PHYSICAL EXAMINATION:  The patient is alert.  Height 5 feet 6  inches.  Weight 130 pounds.  Oriented.  Extraocular movements are intact.  She has significant right quad weakness, which has developed since 2 weeks ago.  Decreased sensation over the anterior right thigh. Heart and  Lungs are clear.  Heart:  Regular rate and rhythm.  Abdomen is soft and nontender.  She has trace knee jerk, 2+ ankle jerk, symmetrical.   RADIOGRAPHS:  MRI scan is reviewed with the patient and her husband, which shows severe L4-5 stenosis.  Evidence of old superior endplate compression.  New extruded 11 x 13 x 7 mm fragment superiorly, which has migrated cephalad from the L4-5 disc space causing severe nerve root compression and compression of the thecal sac.  Severe central stenosis at the L4-5 level.  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   PLAN:  The patient will require decompression surgery at L4-5 with removal of disc herniation free fragment.  Risks of surgery discussed.  Planned procedure is discussed with the patient.  The risks of surgery discussed including problems with bleeding when Coumadin is restarted postoperatively.  Risks of bleeding, re-operation, progressive collapse, and repeat disc herniation all discussed.

## 2014-09-22 NOTE — Telephone Encounter (Signed)
Rx sent 

## 2014-09-22 NOTE — Telephone Encounter (Signed)
Spoke with pt.  She has appt on 9/24 to discuss Lovenox bridge.

## 2014-09-23 ENCOUNTER — Ambulatory Visit (INDEPENDENT_AMBULATORY_CARE_PROVIDER_SITE_OTHER): Payer: Medicare Other | Admitting: *Deleted

## 2014-09-23 DIAGNOSIS — Z5181 Encounter for therapeutic drug level monitoring: Secondary | ICD-10-CM | POA: Diagnosis not present

## 2014-09-23 DIAGNOSIS — I4891 Unspecified atrial fibrillation: Secondary | ICD-10-CM | POA: Diagnosis not present

## 2014-09-23 LAB — POCT INR: INR: 3.5

## 2014-09-23 MED ORDER — ENOXAPARIN SODIUM 60 MG/0.6ML ~~LOC~~ SOLN: 60.0000 mg | SUBCUTANEOUS | Status: DC

## 2014-09-23 NOTE — Patient Instructions (Addendum)
09/24/2015hold coumadin 09/24/2014 no coumadin  09/25/2014 no coumadin Lovenox 60 mg 8am 09/26/2014 no coumadin lovenox 60mg   8am 09/27/2014 no coumadin Lovenox 60mg  8am 09/28/2014 no coumadn Lovenox 60 mg 8am 09/29/2014 no coumadin no lovenox day of  Procedure will be admitted to hospital and dosing of lovenox and coumadin will be dosed in the hospital  Recheck October 5th

## 2014-09-24 ENCOUNTER — Encounter (HOSPITAL_COMMUNITY): Payer: Self-pay | Admitting: Pharmacy Technician

## 2014-09-27 ENCOUNTER — Encounter (HOSPITAL_COMMUNITY): Payer: Self-pay

## 2014-09-27 ENCOUNTER — Encounter (HOSPITAL_COMMUNITY)
Admission: RE | Admit: 2014-09-27 | Discharge: 2014-09-27 | Disposition: A | Discharge status: Home / Self Care | Payer: Medicare Other | Source: Ambulatory Visit | Attending: Orthopaedic Surgery | Admitting: Orthopaedic Surgery

## 2014-09-27 DIAGNOSIS — I10 Essential (primary) hypertension: Secondary | ICD-10-CM | POA: Diagnosis not present

## 2014-09-27 DIAGNOSIS — Z8711 Personal history of peptic ulcer disease: Secondary | ICD-10-CM | POA: Insufficient documentation

## 2014-09-27 DIAGNOSIS — E039 Hypothyroidism, unspecified: Secondary | ICD-10-CM | POA: Diagnosis not present

## 2014-09-27 DIAGNOSIS — M5126 Other intervertebral disc displacement, lumbar region: Secondary | ICD-10-CM | POA: Diagnosis not present

## 2014-09-27 DIAGNOSIS — Z87891 Personal history of nicotine dependence: Secondary | ICD-10-CM | POA: Insufficient documentation

## 2014-09-27 DIAGNOSIS — I4891 Unspecified atrial fibrillation: Secondary | ICD-10-CM | POA: Diagnosis not present

## 2014-09-27 DIAGNOSIS — K219 Gastro-esophageal reflux disease without esophagitis: Secondary | ICD-10-CM | POA: Insufficient documentation

## 2014-09-27 DIAGNOSIS — M48062 Spinal stenosis, lumbar region with neurogenic claudication: Secondary | ICD-10-CM | POA: Diagnosis not present

## 2014-09-27 DIAGNOSIS — D649 Anemia, unspecified: Secondary | ICD-10-CM | POA: Insufficient documentation

## 2014-09-27 DIAGNOSIS — M129 Arthropathy, unspecified: Secondary | ICD-10-CM | POA: Diagnosis not present

## 2014-09-27 DIAGNOSIS — Z8673 Personal history of transient ischemic attack (TIA), and cerebral infarction without residual deficits: Secondary | ICD-10-CM | POA: Insufficient documentation

## 2014-09-27 DIAGNOSIS — Z01818 Encounter for other preprocedural examination: Secondary | ICD-10-CM | POA: Diagnosis not present

## 2014-09-27 DIAGNOSIS — M1990 Unspecified osteoarthritis, unspecified site: Secondary | ICD-10-CM | POA: Insufficient documentation

## 2014-09-27 DIAGNOSIS — M4806 Spinal stenosis, lumbar region: Principal | ICD-10-CM | POA: Insufficient documentation

## 2014-09-27 HISTORY — DX: Hypothyroidism, unspecified: E03.9

## 2014-09-27 HISTORY — DX: Personal history of other endocrine, nutritional and metabolic disease: Z86.39

## 2014-09-27 HISTORY — DX: Anemia, unspecified: D64.9

## 2014-09-27 HISTORY — DX: Unspecified osteoarthritis, unspecified site: M19.90

## 2014-09-27 LAB — COMPREHENSIVE METABOLIC PANEL
ALBUMIN: 4.5 g/dL (ref 3.5–5.2)
ALT: 12 U/L (ref 0–35)
ANION GAP: 13 (ref 5–15)
AST: 21 U/L (ref 0–37)
Alkaline Phosphatase: 52 U/L (ref 39–117)
BILIRUBIN TOTAL: 0.3 mg/dL (ref 0.3–1.2)
BUN: 16 mg/dL (ref 6–23)
CHLORIDE: 94 meq/L — AB (ref 96–112)
CO2: 25 mEq/L (ref 19–32)
Calcium: 10 mg/dL (ref 8.4–10.5)
Creatinine, Ser: 0.88 mg/dL (ref 0.50–1.10)
GFR calc Af Amer: 72 mL/min — ABNORMAL LOW (ref >90)
GFR calc non Af Amer: 62 mL/min — ABNORMAL LOW (ref >90)
Glucose, Bld: 99 mg/dL (ref 70–99)
POTASSIUM: 3.9 meq/L (ref 3.7–5.3)
Sodium: 132 mEq/L — ABNORMAL LOW (ref 137–147)
TOTAL PROTEIN: 7.2 g/dL (ref 6.0–8.3)

## 2014-09-27 LAB — APTT: APTT: 52 s — AB (ref 24–37)

## 2014-09-27 LAB — CBC
HCT: 29.4 % — ABNORMAL LOW (ref 36.0–46.0)
Hemoglobin: 8.9 g/dL — ABNORMAL LOW (ref 12.0–15.0)
MCH: 21.5 pg — ABNORMAL LOW (ref 26.0–34.0)
MCHC: 30.3 g/dL (ref 30.0–36.0)
MCV: 71.2 fL — ABNORMAL LOW (ref 78.0–100.0)
PLATELETS: 363 10*3/uL (ref 150–400)
RBC: 4.13 MIL/uL (ref 3.87–5.11)
RDW: 17.4 % — AB (ref 11.5–15.5)
WBC: 5.4 10*3/uL (ref 4.0–10.5)

## 2014-09-27 LAB — PROTIME-INR
INR: 1.26 (ref 0.00–1.49)
PROTHROMBIN TIME: 15.8 s — AB (ref 11.6–15.2)

## 2014-09-27 LAB — SURGICAL PCR SCREEN
MRSA, PCR: NEGATIVE
STAPHYLOCOCCUS AUREUS: NEGATIVE

## 2014-09-27 NOTE — Progress Notes (Signed)
Pt denies SOB and chest pain but is under the care of Dr. Melynda Ripple ( at [facility]). Pt denies having a chest x ray and EKG within the last year. Pt denies having a cardiac cath. Pt stated that her last dose of Coumadin was Thursday, 09/23/14 and her last dose of Lovenox will be 09/28/14

## 2014-09-27 NOTE — Pre-Procedure Instructions (Signed)
Lisa Lucero  09/27/2014   Your procedure is scheduled on: Wednesday, September 29, 2014  Report to [facility] Admitting at 10:30 AM  Call this number if you have problems the morning of surgery: [phone]   Remember: Follow doctors instructions regarding warfarin (COUMADIN) and Lovenox   Do not eat food or drink liquids after midnight Tuesday, September 28, 2014  Take these medicines the morning of surgery with A SIP OF WATER: diltiazem (CARDIZEM LA),  levothyroxine (SYNTHROID), metoprolol (LOPRESSOR), pantoprazole (PROTONIX), if needed: nitroGLYCERIN (NITROSTAT) for chest pain, oxyCODONE for pain, ketotifen (THERA TEARS ALLERGY) for dry eyes Stop taking Aspirin, vitamins, and herbal medications. Do not take any NSAIDs ie: Ibuprofen, Advil, Naproxen or any medication containing Aspirin.  Do not wear jewelry, make-up or nail polish.   Do not wear lotions, powders, or perfumes. You may not wear deodorant.  Do not shave 48 hours prior to surgery.  Do not bring valuables to the hospital.  [facility] is not responsible for any belongings or valuables.               Contacts, dentures or bridgework may not be worn into surgery.  Leave suitcase in the car. After surgery it may be brought to your room.  For patients admitted to the hospital, discharge time is determined by your treatment team.               Patients discharged the day of surgery will not be allowed to drive home.  Name and phone number of your driver:   Special Instructions:  Special Instructions:Special Instructions: [facility] - Preparing for Surgery  Before surgery, you can play an important role.  Because skin is not sterile, your skin needs to be as free of germs as possible.  You can reduce the number of germs on you skin by washing with CHG (chlorahexidine gluconate) soap before surgery.  CHG is an antiseptic cleaner which kills germs and bonds with the skin to continue killing germs even after  washing.  Please DO NOT use if you have an allergy to CHG or antibacterial soaps.  If your skin becomes reddened/irritated stop using the CHG and inform your nurse when you arrive at Short Stay.  Do not shave (including legs and underarms) for at least 48 hours prior to the first CHG shower.  You may shave your face.  Please follow these instructions carefully:   1.  Shower with CHG Soap the night before surgery and the morning of Surgery.  2.  If you choose to wash your hair, wash your hair first as usual with your normal shampoo.  3.  After you shampoo, rinse your hair and body thoroughly to remove the Shampoo.  4.  Use CHG as you would any other liquid soap.  You can apply chg directly  to the skin and wash gently with scrungie or a clean washcloth.  5.  Apply the CHG Soap to your body ONLY FROM THE NECK DOWN.  Do not use on open wounds or open sores.  Avoid contact with your eyes, ears, mouth and genitals (private parts).  Wash genitals (private parts) with your normal soap.  6.  Wash thoroughly, paying special attention to the area where your surgery will be performed.  7.  Thoroughly rinse your body with warm water from the neck down.  8.  DO NOT shower/wash with your normal soap after using and rinsing off the CHG Soap.  9.  Pat yourself dry  with a clean towel.            10.  Wear clean pajamas.            11.  Place clean sheets on your bed the night of your first shower and do not sleep with pets.  Day of Surgery  Do not apply any lotions/deodorants the morning of surgery.  Please wear clean clothes to the hospital/surgery center.   Please read over the following fact sheets that you were given: Pain Booklet, Coughing and Deep Breathing, MRSA Information and Surgical Site Infection Prevention

## 2014-09-28 ENCOUNTER — Telehealth: Payer: Self-pay | Admitting: Cardiology

## 2014-09-28 ENCOUNTER — Encounter (HOSPITAL_COMMUNITY): Payer: Self-pay

## 2014-09-28 MED ORDER — WARFARIN SODIUM 5 MG PO TABS: 2.5000 mg | ORAL_TABLET | Freq: Every day | ORAL | Status: DC

## 2014-09-28 MED ORDER — CEFAZOLIN SODIUM-DEXTROSE 2-3 GM-% IV SOLR
2.0000 g | INTRAVENOUS | Status: AC
  Administered 2014-09-29: 2 g via INTRAVENOUS
  Filled 2014-09-28: qty 50

## 2014-09-28 NOTE — Progress Notes (Signed)
Anesthesia Chart Review:  Patient is a 79 year old female scheduled for L4-5 decompression, removal free fragment on 09/29/14 by Dr. Lorin Mercy.  History includes former smoker, paroxysmal afib, embolic CVA '11, erosive gastritis with GI bleed 09/2010, hypothyroidism (on levothyroxine), GERD, COPD, peripheral edema, hypercholesterolemia, arthritis, hysterectomy, breast augmentation, cholecystectomy '13. Cardiologist is Dr. Mare Ferrari who is aware of plans for surgery and recommended a Lovenox bridge. She was last seen by Truitt Merle, NP on 06/07/14.  PCP is listed as Dr. Florina Ou with last visit on 09/13/14 with Reita Cliche, FNP.  Dr. Modena Morrow is currently on medical leave.    EKG on 09/27/14 showed: SB at 59 bpm, possible LAE, LVH, T wave abnormality, consider anterior ischemia. RSR prime pattern in V1 - 2. T wave abnormality is new in V3 since 2013-05-24 and more pronounced when compared to 2012/10/26 EKG.   Echo on 07/13/10 showed: - Left ventricle: The cavity size was normal. Wall thickness was normal. Systolic function was normal. The estimated ejection fraction was in the range of 55% to 60%. Wall motion was normal; there were no regional wall motion abnormalities. - Aorta: The aorta was not dilated, mildly calcified along aortic arch and thoracic aorta. - Mitral valve: No evidence of vegetation. - Left atrium: No evidence of thrombus in the atrial cavity or appendage. - Right atrium: No evidence of thrombus in the atrial cavity or appendage. - Atrial septum: No defect or patent foramen ovale was identified. Echo contrast study showed no right-to-left atrial level shunt, following an increase in RA pressure induced by provocative maneuvers. - Tricuspid valve: No evidence of vegetation. - Pulmonic valve: No evidence of vegetation. - Superior vena cava: The study excluded a thrombus. Impressions: No cardiac source of emboli was indentified.  Nuclear stress test on 03/30/09 showed: Normal stress nuclear study. No  reversible ischemia or infarct. Good LV systolic function.  CXR on 09/27/14 showed No acute disease.  Preoperative labs noted.  H/H 8.9/29.4. Comparison labs from Dr. Greta Doom office just received with H/H on 08/09/14 were 8.5/28.1.  I was able to speak with patient this morning  She reports known anemia with prior work-up in 2011.  Most recently, her PCP has been trying to treat her with oral iron supplements.  Patient has been inconsistent with taking because she is running low and hasn't gotten a refill yet.  She denies fatigue/decreased energy, chest pain, SOB, dizziness, obvious blood loss (ie hemoptysis or hematochezia). PT/INR 15.8/1.26, PTT 52.  Her last dose of Coumadin pre-op was 09/23/14.  She is scheduled to hold Lovenox bridge on 09/28/14. I did notify patient that she may ultimately need a perioperative transfusion, but will defer to the surgeon and/or anesthesiologist.    I called and spoke with Malachy Mood at Dr. Lorin Mercy office regarding labs and comparison labs from 07/2014. She spoke with him. He questioned if anemia was related to her anticoagulation therapy. She told me that he felt hat EBL would not be significant for this procedure and that case was not completely elective since she did have a free fragment. Dr. Lorin Mercy feels case should proceed as planned.  I have reviewed with anesthesiologist Dr. Deatra Canter.  I'll order a repeat PTT and T&S on arrival. Defer decision for perioperative transfusion to her surgeon or anesthesiologist.  Of note, I did route labs to Dr. Glean Salen, FNP and to Dr. Mare Ferrari since he is monitoring her anticoagulation therapy.    George Hugh [facility] Short Stay Center/Anesthesiology Phone [phone] 09/28/2014  4:20 PM

## 2014-09-28 NOTE — Telephone Encounter (Signed)
New message     [facility] to give [facility] on her warfarin---ref #[id number]

## 2014-09-28 NOTE — Telephone Encounter (Signed)
Spoke with pt.  She needs refill sent to [facility].  Rx sent.

## 2014-09-29 ENCOUNTER — Inpatient Hospital Stay (HOSPITAL_COMMUNITY): Payer: Medicare Other

## 2014-09-29 ENCOUNTER — Encounter (HOSPITAL_COMMUNITY): Payer: Self-pay | Admitting: *Deleted

## 2014-09-29 ENCOUNTER — Encounter (HOSPITAL_COMMUNITY)
Admission: RE | Disposition: A | Discharge status: Home / Self Care | Payer: Self-pay | Source: Ambulatory Visit | Attending: Orthopaedic Surgery

## 2014-09-29 ENCOUNTER — Encounter (HOSPITAL_COMMUNITY): Payer: Medicare Other | Admitting: Vascular Surgery

## 2014-09-29 ENCOUNTER — Observation Stay (HOSPITAL_COMMUNITY)
Admission: RE | Admit: 2014-09-29 | Discharge: 2014-09-30 | Disposition: A | Discharge status: Home / Self Care | Payer: Medicare Other | Source: Ambulatory Visit | Attending: Orthopaedic Surgery | Admitting: Orthopaedic Surgery

## 2014-09-29 ENCOUNTER — Inpatient Hospital Stay (HOSPITAL_COMMUNITY): Payer: Medicare Other | Admitting: Critical Care Medicine

## 2014-09-29 DIAGNOSIS — M5126 Other intervertebral disc displacement, lumbar region: Secondary | ICD-10-CM | POA: Diagnosis not present

## 2014-09-29 DIAGNOSIS — M4806 Spinal stenosis, lumbar region: Secondary | ICD-10-CM | POA: Diagnosis not present

## 2014-09-29 DIAGNOSIS — M48061 Spinal stenosis, lumbar region without neurogenic claudication: Secondary | ICD-10-CM | POA: Diagnosis not present

## 2014-09-29 DIAGNOSIS — M47817 Spondylosis without myelopathy or radiculopathy, lumbosacral region: Secondary | ICD-10-CM | POA: Diagnosis not present

## 2014-09-29 DIAGNOSIS — M1990 Unspecified osteoarthritis, unspecified site: Secondary | ICD-10-CM | POA: Diagnosis not present

## 2014-09-29 DIAGNOSIS — M519 Unspecified thoracic, thoracolumbar and lumbosacral intervertebral disc disorder: Secondary | ICD-10-CM | POA: Diagnosis not present

## 2014-09-29 DIAGNOSIS — M129 Arthropathy, unspecified: Secondary | ICD-10-CM | POA: Diagnosis not present

## 2014-09-29 DIAGNOSIS — M48062 Spinal stenosis, lumbar region with neurogenic claudication: Secondary | ICD-10-CM | POA: Diagnosis not present

## 2014-09-29 DIAGNOSIS — I10 Essential (primary) hypertension: Secondary | ICD-10-CM | POA: Diagnosis not present

## 2014-09-29 DIAGNOSIS — M545 Low back pain, unspecified: Secondary | ICD-10-CM | POA: Diagnosis not present

## 2014-09-29 HISTORY — PX: LUMBAR LAMINECTOMY: SHX95

## 2014-09-29 LAB — PREPARE RBC (CROSSMATCH)

## 2014-09-29 LAB — APTT: aPTT: 39 seconds — ABNORMAL HIGH (ref 24–37)

## 2014-09-29 SURGERY — MICRODISCECTOMY LUMBAR LAMINECTOMY
Anesthesia: General

## 2014-09-29 MED ORDER — MIDAZOLAM HCL 2 MG/2ML IJ SOLN
INTRAMUSCULAR | Status: AC
Start: 2014-09-29 — End: 2014-09-29
  Filled 2014-09-29: qty 2

## 2014-09-29 MED ORDER — PANTOPRAZOLE SODIUM 40 MG PO TBEC
40.0000 mg | DELAYED_RELEASE_TABLET | Freq: Every day | ORAL | Status: DC
  Administered 2014-09-30: 40 mg via ORAL
  Filled 2014-09-29: qty 1

## 2014-09-29 MED ORDER — FERROUS SULFATE 325 (65 FE) MG PO TABS
325.0000 mg | ORAL_TABLET | Freq: Every day | ORAL | Status: DC
  Filled 2014-09-29 (×2): qty 1

## 2014-09-29 MED ORDER — HYDROCORTISONE ACETATE 25 MG RE SUPP
25.0000 mg | Freq: Two times a day (BID) | RECTAL | Status: DC | PRN
  Filled 2014-09-29: qty 1

## 2014-09-29 MED ORDER — HYDROMORPHONE HCL 1 MG/ML IJ SOLN
INTRAMUSCULAR | Status: AC
  Filled 2014-09-29: qty 1

## 2014-09-29 MED ORDER — CEFAZOLIN SODIUM 1-5 GM-% IV SOLN
1.0000 g | Freq: Three times a day (TID) | INTRAVENOUS | Status: AC
  Administered 2014-09-29 – 2014-09-30 (×2): 1 g via INTRAVENOUS
  Filled 2014-09-29 (×2): qty 50

## 2014-09-29 MED ORDER — ONDANSETRON HCL 4 MG/2ML IJ SOLN
INTRAMUSCULAR | Status: AC
  Filled 2014-09-29: qty 2

## 2014-09-29 MED ORDER — LEVOTHYROXINE SODIUM 25 MCG PO TABS
25.0000 ug | ORAL_TABLET | Freq: Every day | ORAL | Status: DC
  Administered 2014-09-30: 25 ug via ORAL
  Filled 2014-09-29 (×3): qty 1

## 2014-09-29 MED ORDER — ACETAMINOPHEN 650 MG RE SUPP: 650.0000 mg | RECTAL | Status: DC | PRN

## 2014-09-29 MED ORDER — PROPOFOL 10 MG/ML IV BOLUS
INTRAVENOUS | Status: AC
  Filled 2014-09-29: qty 20

## 2014-09-29 MED ORDER — WARFARIN - PHYSICIAN DOSING INPATIENT: Freq: Every day | Status: DC

## 2014-09-29 MED ORDER — FENTANYL CITRATE 0.05 MG/ML IJ SOLN
INTRAMUSCULAR | Status: AC
  Filled 2014-09-29: qty 5

## 2014-09-29 MED ORDER — KETOTIFEN FUMARATE 0.025 % OP SOLN: 1.0000 [drp] | Freq: Two times a day (BID) | OPHTHALMIC | Status: DC | PRN

## 2014-09-29 MED ORDER — PHENOL 1.4 % MT LIQD: 1.0000 | OROMUCOSAL | Status: DC | PRN

## 2014-09-29 MED ORDER — WARFARIN SODIUM 5 MG PO TABS
5.0000 mg | ORAL_TABLET | ORAL | Status: DC
  Filled 2014-09-29: qty 1

## 2014-09-29 MED ORDER — HYDROCODONE-ACETAMINOPHEN 5-325 MG PO TABS
1.0000 | ORAL_TABLET | ORAL | Status: DC | PRN
  Administered 2014-09-29 – 2014-09-30 (×3): 2 via ORAL
  Filled 2014-09-29 (×3): qty 2

## 2014-09-29 MED ORDER — METOPROLOL TARTRATE 50 MG PO TABS
50.0000 mg | ORAL_TABLET | Freq: Two times a day (BID) | ORAL | Status: DC
  Administered 2014-09-29 – 2014-09-30 (×2): 50 mg via ORAL
  Filled 2014-09-29 (×3): qty 1

## 2014-09-29 MED ORDER — WARFARIN SODIUM 2.5 MG PO TABS: 2.5000 mg | ORAL_TABLET | ORAL | Status: DC

## 2014-09-29 MED ORDER — POTASSIUM CHLORIDE CRYS ER 10 MEQ PO TBCR
10.0000 meq | EXTENDED_RELEASE_TABLET | Freq: Two times a day (BID) | ORAL | Status: DC
  Administered 2014-09-29 – 2014-09-30 (×2): 10 meq via ORAL
  Filled 2014-09-29 (×3): qty 1

## 2014-09-29 MED ORDER — ADULT MULTIVITAMIN W/MINERALS CH
1.0000 | ORAL_TABLET | Freq: Every day | ORAL | Status: DC
  Administered 2014-09-30: 1 via ORAL
  Filled 2014-09-29 (×2): qty 1

## 2014-09-29 MED ORDER — DEXAMETHASONE SODIUM PHOSPHATE 4 MG/ML IJ SOLN
INTRAMUSCULAR | Status: AC
  Filled 2014-09-29: qty 1

## 2014-09-29 MED ORDER — MENTHOL 3 MG MT LOZG: 1.0000 | LOZENGE | OROMUCOSAL | Status: DC | PRN

## 2014-09-29 MED ORDER — DOCUSATE SODIUM 100 MG PO CAPS
100.0000 mg | ORAL_CAPSULE | Freq: Two times a day (BID) | ORAL | Status: DC
  Administered 2014-09-30: 100 mg via ORAL
  Filled 2014-09-29 (×2): qty 1

## 2014-09-29 MED ORDER — DEXTROSE 5 % IV SOLN
500.0000 mg | Freq: Four times a day (QID) | INTRAVENOUS | Status: DC | PRN
  Filled 2014-09-29: qty 5

## 2014-09-29 MED ORDER — THROMBIN 5000 UNITS EX SOLR
CUTANEOUS | Status: AC
  Filled 2014-09-29: qty 5000

## 2014-09-29 MED ORDER — FLEET ENEMA 7-19 GM/118ML RE ENEM: 1.0000 | ENEMA | Freq: Once | RECTAL | Status: AC | PRN

## 2014-09-29 MED ORDER — SENNOSIDES-DOCUSATE SODIUM 8.6-50 MG PO TABS: 1.0000 | ORAL_TABLET | Freq: Every evening | ORAL | Status: DC | PRN

## 2014-09-29 MED ORDER — HYDROMORPHONE HCL 1 MG/ML IJ SOLN
0.2500 mg | INTRAMUSCULAR | Status: DC | PRN
  Administered 2014-09-29 (×2): 0.5 mg via INTRAVENOUS

## 2014-09-29 MED ORDER — DEXAMETHASONE SODIUM PHOSPHATE 4 MG/ML IJ SOLN
INTRAMUSCULAR | Status: DC | PRN
  Administered 2014-09-29: 4 mg via INTRAVENOUS

## 2014-09-29 MED ORDER — METHOCARBAMOL 500 MG PO TABS: 500.0000 mg | ORAL_TABLET | Freq: Four times a day (QID) | ORAL | Status: DC | PRN

## 2014-09-29 MED ORDER — BISACODYL 10 MG RE SUPP
10.0000 mg | Freq: Every day | RECTAL | Status: DC | PRN
Start: 2014-09-29 — End: 2014-09-30

## 2014-09-29 MED ORDER — DILTIAZEM HCL ER COATED BEADS 240 MG PO TB24
240.0000 mg | ORAL_TABLET | Freq: Every day | ORAL | Status: DC
  Administered 2014-09-30: 240 mg via ORAL
  Filled 2014-09-29: qty 1

## 2014-09-29 MED ORDER — SODIUM CHLORIDE 0.9 % IJ SOLN: 3.0000 mL | Freq: Two times a day (BID) | INTRAMUSCULAR | Status: DC

## 2014-09-29 MED ORDER — ONDANSETRON HCL 4 MG/2ML IJ SOLN: 4.0000 mg | INTRAMUSCULAR | Status: DC | PRN

## 2014-09-29 MED ORDER — OXYCODONE-ACETAMINOPHEN 5-325 MG PO TABS: 1.0000 | ORAL_TABLET | ORAL | Status: DC | PRN

## 2014-09-29 MED ORDER — WARFARIN SODIUM 2.5 MG PO TABS: 2.5000 mg | ORAL_TABLET | Freq: Every day | ORAL | Status: DC

## 2014-09-29 MED ORDER — SODIUM CHLORIDE 0.9 % IJ SOLN: 3.0000 mL | INTRAMUSCULAR | Status: DC | PRN

## 2014-09-29 MED ORDER — HYDROCHLOROTHIAZIDE 25 MG PO TABS
12.5000 mg | ORAL_TABLET | Freq: Every day | ORAL | Status: DC
  Administered 2014-09-30: 12.5 mg via ORAL
  Filled 2014-09-29: qty 0.5

## 2014-09-29 MED ORDER — PROPOFOL 10 MG/ML IV BOLUS
INTRAVENOUS | Status: DC | PRN
  Administered 2014-09-29 (×2): 20 mg via INTRAVENOUS
  Administered 2014-09-29: 70 mg via INTRAVENOUS
  Administered 2014-09-29: 20 mg via INTRAVENOUS

## 2014-09-29 MED ORDER — ARTIFICIAL TEARS OP OINT
TOPICAL_OINTMENT | OPHTHALMIC | Status: DC | PRN
  Administered 2014-09-29: 1 via OPHTHALMIC

## 2014-09-29 MED ORDER — METHOCARBAMOL 1000 MG/10ML IJ SOLN
500.0000 mg | INTRAVENOUS | Status: AC
  Administered 2014-09-29: 500 mg via INTRAVENOUS
  Filled 2014-09-29: qty 5

## 2014-09-29 MED ORDER — 0.9 % SODIUM CHLORIDE (POUR BTL) OPTIME
TOPICAL | Status: DC | PRN
  Administered 2014-09-29: 1000 mL

## 2014-09-29 MED ORDER — ACETAMINOPHEN 325 MG PO TABS: 650.0000 mg | ORAL_TABLET | ORAL | Status: DC | PRN

## 2014-09-29 MED ORDER — LIDOCAINE HCL (CARDIAC) 20 MG/ML IV SOLN
INTRAVENOUS | Status: DC | PRN
  Administered 2014-09-29: 40 mg via INTRAVENOUS

## 2014-09-29 MED ORDER — SODIUM CHLORIDE 0.9 % IV SOLN
250.0000 mL | INTRAVENOUS | Status: DC
Start: 2014-09-29 — End: 2014-09-30

## 2014-09-29 MED ORDER — ZOLPIDEM TARTRATE 5 MG PO TABS: 5.0000 mg | ORAL_TABLET | Freq: Every evening | ORAL | Status: DC | PRN

## 2014-09-29 MED ORDER — KCL IN DEXTROSE-NACL 20-5-0.45 MEQ/L-%-% IV SOLN
INTRAVENOUS | Status: DC
  Filled 2014-09-29 (×3): qty 1000

## 2014-09-29 MED ORDER — LISINOPRIL 20 MG PO TABS
20.0000 mg | ORAL_TABLET | Freq: Two times a day (BID) | ORAL | Status: DC
  Administered 2014-09-29 – 2014-09-30 (×2): 20 mg via ORAL
  Filled 2014-09-29 (×3): qty 1

## 2014-09-29 MED ORDER — FENTANYL CITRATE 0.05 MG/ML IJ SOLN
INTRAMUSCULAR | Status: DC | PRN
Start: 2014-09-29 — End: 2014-09-29
  Administered 2014-09-29: 50 ug via INTRAVENOUS
  Administered 2014-09-29: 100 ug via INTRAVENOUS
  Administered 2014-09-29: 50 ug via INTRAVENOUS

## 2014-09-29 MED ORDER — GLYCOPYRROLATE 0.2 MG/ML IJ SOLN
INTRAMUSCULAR | Status: DC | PRN
  Administered 2014-09-29: 0.6 mg via INTRAVENOUS

## 2014-09-29 MED ORDER — LACTATED RINGERS IV SOLN
INTRAVENOUS | Status: DC
  Administered 2014-09-29 (×2): via INTRAVENOUS

## 2014-09-29 MED ORDER — MIDAZOLAM HCL 5 MG/5ML IJ SOLN
INTRAMUSCULAR | Status: DC | PRN
  Administered 2014-09-29 (×2): 1 mg via INTRAVENOUS

## 2014-09-29 MED ORDER — NITROGLYCERIN 0.4 MG SL SUBL: 0.4000 mg | SUBLINGUAL_TABLET | SUBLINGUAL | Status: DC | PRN

## 2014-09-29 MED ORDER — ROCURONIUM BROMIDE 100 MG/10ML IV SOLN
INTRAVENOUS | Status: DC | PRN
  Administered 2014-09-29: 50 mg via INTRAVENOUS

## 2014-09-29 MED ORDER — ONDANSETRON HCL 4 MG/2ML IJ SOLN
INTRAMUSCULAR | Status: DC | PRN
  Administered 2014-09-29: 4 mg via INTRAVENOUS

## 2014-09-29 MED ORDER — NEOSTIGMINE METHYLSULFATE 10 MG/10ML IV SOLN
INTRAVENOUS | Status: DC | PRN
  Administered 2014-09-29: 4 mg via INTRAVENOUS

## 2014-09-29 MED ORDER — MORPHINE SULFATE 2 MG/ML IJ SOLN: 1.0000 mg | INTRAMUSCULAR | Status: DC | PRN

## 2014-09-29 SURGICAL SUPPLY — 53 items
ADH SKN CLS APL DERMABOND .7 (GAUZE/BANDAGES/DRESSINGS) ×1
BUR ROUND FLUTED 4 SOFT TCH (BURR) ×1 IMPLANT
BUR ROUND FLUTED 4MM SOFT TCH (BURR) ×1
CORDS BIPOLAR (ELECTRODE) ×3 IMPLANT
COVER SURGICAL LIGHT HANDLE (MISCELLANEOUS) ×3 IMPLANT
DERMABOND ADVANCED (GAUZE/BANDAGES/DRESSINGS) ×2
DERMABOND ADVANCED .7 DNX12 (GAUZE/BANDAGES/DRESSINGS) ×1 IMPLANT
DRAPE MICROSCOPE LEICA (MISCELLANEOUS) ×3 IMPLANT
DRAPE PROXIMA HALF (DRAPES) ×6 IMPLANT
DRSG EMULSION OIL 3X3 NADH (GAUZE/BANDAGES/DRESSINGS) ×5 IMPLANT
DRSG MEPILEX BORDER 4X4 (GAUZE/BANDAGES/DRESSINGS) ×3 IMPLANT
DRSG MEPILEX BORDER 4X8 (GAUZE/BANDAGES/DRESSINGS) ×5 IMPLANT
DURAPREP 26ML APPLICATOR (WOUND CARE) ×3 IMPLANT
DURASEAL SPINE SEALANT 3ML (MISCELLANEOUS) IMPLANT
ELECT REM PT RETURN 9FT ADLT (ELECTROSURGICAL) ×3
ELECTRODE REM PT RTRN 9FT ADLT (ELECTROSURGICAL) ×1 IMPLANT
GAUZE SPONGE 4X4 12PLY STRL (GAUZE/BANDAGES/DRESSINGS) ×1 IMPLANT
GLOVE BIOGEL PI IND STRL 7.0 (GLOVE) IMPLANT
GLOVE BIOGEL PI IND STRL 7.5 (GLOVE) ×1 IMPLANT
GLOVE BIOGEL PI IND STRL 8 (GLOVE) ×1 IMPLANT
GLOVE BIOGEL PI INDICATOR 7.0 (GLOVE) ×2
GLOVE BIOGEL PI INDICATOR 7.5 (GLOVE) ×2
GLOVE BIOGEL PI INDICATOR 8 (GLOVE) ×2
GLOVE BIOGEL PI ORTHO PRO SZ7 (GLOVE) ×2
GLOVE ECLIPSE 7.0 STRL STRAW (GLOVE) ×3 IMPLANT
GLOVE ORTHO TXT STRL SZ7.5 (GLOVE) ×3 IMPLANT
GLOVE PI ORTHO PRO STRL SZ7 (GLOVE) IMPLANT
GOWN STRL REUS W/ TWL LRG LVL3 (GOWN DISPOSABLE) ×3 IMPLANT
GOWN STRL REUS W/TWL LRG LVL3 (GOWN DISPOSABLE) ×12
KIT BASIN OR (CUSTOM PROCEDURE TRAY) ×3 IMPLANT
KIT ROOM TURNOVER OR (KITS) ×3 IMPLANT
MANIFOLD NEPTUNE II (INSTRUMENTS) ×3 IMPLANT
NDL SPNL 18GX3.5 QUINCKE PK (NEEDLE) ×1 IMPLANT
NDL SUT .5 MAYO 1.404X.05X (NEEDLE) ×1 IMPLANT
NEEDLE 22X1 1/2 (OR ONLY) (NEEDLE) ×3 IMPLANT
NEEDLE MAYO TAPER (NEEDLE)
NEEDLE SPNL 18GX3.5 QUINCKE PK (NEEDLE) ×3 IMPLANT
NS IRRIG 1000ML POUR BTL (IV SOLUTION) ×3 IMPLANT
PACK LAMINECTOMY ORTHO (CUSTOM PROCEDURE TRAY) ×3 IMPLANT
PAD ARMBOARD 7.5X6 YLW CONV (MISCELLANEOUS) ×6 IMPLANT
PATTIES SURGICAL .5 X.5 (GAUZE/BANDAGES/DRESSINGS) ×3 IMPLANT
PATTIES SURGICAL .75X.75 (GAUZE/BANDAGES/DRESSINGS) IMPLANT
SUT VIC AB 2-0 CT1 27 (SUTURE) ×3
SUT VIC AB 2-0 CT1 TAPERPNT 27 (SUTURE) ×1 IMPLANT
SUT VICRYL 0 TIES 12 18 (SUTURE) ×1 IMPLANT
SUT VICRYL 0 UR6 27IN ABS (SUTURE) ×2 IMPLANT
SUT VICRYL 4-0 PS2 18IN ABS (SUTURE) IMPLANT
SUT VICRYL AB 2 0 TIES (SUTURE) ×1 IMPLANT
SYR 20ML ECCENTRIC (SYRINGE) IMPLANT
SYR CONTROL 10ML LL (SYRINGE) ×3 IMPLANT
TOWEL OR 17X24 6PK STRL BLUE (TOWEL DISPOSABLE) ×3 IMPLANT
TOWEL OR 17X26 10 PK STRL BLUE (TOWEL DISPOSABLE) ×3 IMPLANT
WATER STERILE IRR 1000ML POUR (IV SOLUTION) ×1 IMPLANT

## 2014-09-29 NOTE — Transfer of Care (Signed)
Immediate Anesthesia Transfer of Care Note  Patient: Lisa Lucero  Procedure(s) Performed: Procedure(s): L4-5 Decompression, Hemi-Laminectomy,  Removal Free Fragment (N/A)  Patient Location: PACU  Anesthesia Type:General  Level of Consciousness: awake, alert  and oriented  Airway & Oxygen Therapy: Patient Spontanous Breathing and Patient connected to nasal cannula oxygen  Post-op Assessment: Report given to PACU RN, Post -op Vital signs reviewed and stable and Patient moving all extremities X 4  Post vital signs: Reviewed and stable  Complications: No apparent anesthesia complications

## 2014-09-29 NOTE — Anesthesia Preprocedure Evaluation (Addendum)
Anesthesia Evaluation  Patient identified by MRN, date of birth, ID band Patient awake    Reviewed: Allergy & Precautions, H&P , NPO status , Patient's Chart, lab work & pertinent test results, reviewed documented beta blocker date and time   Airway Mallampati: II TM Distance: >3 FB Neck ROM: Full    Dental no notable dental hx. (+) Dental Advisory Given, Teeth Intact   Pulmonary neg pulmonary ROS, former smoker,  breath sounds clear to auscultation  Pulmonary exam normal       Cardiovascular hypertension, Pt. on medications and Pt. on home beta blockers + dysrhythmias Atrial Fibrillation Rhythm:Regular Rate:Normal  Echo on 07/13/10 showed: - Left ventricle: The cavity size was normal. Wall thickness was normal. Systolic function was normal. The estimated ejection fraction was in the range of 55% to 60%. Wall motion was normal; there were no regional wall motion abnormalities. - Aorta: The aorta was not dilated, mildly calcified along aortic arch and thoracic aorta. - Mitral valve: No evidence of vegetation. - Left atrium: No evidence of thrombus in the atrial cavity or appendage. - Right atrium: No evidence of thrombus in the atrial cavity or appendage. - Atrial septum: No defect or patent foramen ovale was identified. Echo contrast study showed no right-to-left atrial level shunt, following an increase in RA pressure induced by provocative maneuvers. - Tricuspid valve: No evidence of vegetation. - Pulmonic valve: No evidence of vegetation. - Superior vena cava: The study excluded a thrombus. Impressions: No cardiac source of emboli was indentified.   Nuclear stress test on 03/30/09 showed: Normal stress nuclear study. No reversible ischemia or infarct. Good LV systolic function.  On coumadin - transitioned to Lovenox bridge per cardiology    Neuro/Psych CVA, No Residual Symptoms negative psych ROS   GI/Hepatic Neg liver ROS, PUD,  GERD-  Medicated and Controlled,  Endo/Other  Hypothyroidism Hyperthyroidism   Renal/GU negative Renal ROS  negative genitourinary   Musculoskeletal  (+) Arthritis -,   Abdominal   Peds  Hematology negative hematology ROS (+) anemia , H/H 8.9/29.4   Anesthesia Other Findings   Reproductive/Obstetrics negative OB ROS                          Anesthesia Physical Anesthesia Plan  ASA: III  Anesthesia Plan: General   Post-op Pain Management:    Induction: Intravenous  Airway Management Planned: Oral ETT  Additional Equipment:   Intra-op Plan:   Post-operative Plan: Extubation in OR  Informed Consent: I have reviewed the patients History and Physical, chart, labs and discussed the procedure including the risks, benefits and alternatives for the proposed anesthesia with the patient or authorized representative who has indicated his/her understanding and acceptance.   Dental advisory given  Plan Discussed with: Anesthesiologist and Surgeon  Anesthesia Plan Comments:         Anesthesia Quick Evaluation

## 2014-09-29 NOTE — Discharge Instructions (Signed)
No lifting greater than 10 lbs. Avoid bending, stooping and twisting. Walk in house for first week them may start to get out slowly increasing distance up to one mile by 3 weeks post op. Keep incision dry for 3 days, may use tegaderm or similar water impervious dressing. Change dressing daily or as needed.  Ice packs as needed for pain and swelling.

## 2014-09-29 NOTE — Brief Op Note (Cosign Needed)
09/29/2014  2:23 PM  PATIENT:  Lisa Lucero  79 y.o. female  PRE-OPERATIVE DIAGNOSIS:  L4-5 Stenosis with Right HNP Free Fragment  POST-OPERATIVE DIAGNOSIS:  L4-5 Stenosis with Right HNP Free Fragment  PROCEDURE:  Procedure(s): L4-5 Decompression, Hemi-Laminectomy,  Removal Free Fragment (N/A)  SURGEON:  Surgeon(s) and Role:    * Marybelle Killings, MD - Primary  PHYSICIAN ASSISTANT: Phillips Hay PAC  ASSISTANTS: none   ANESTHESIA:   general  EBL:  Total I/O In: 1300 [I.V.:1300] Out: 75 [Blood:75]  BLOOD ADMINISTERED:none  DRAINS: none   LOCAL MEDICATIONS USED:  NONE  SPECIMEN:  No Specimen  DISPOSITION OF SPECIMEN:  N/A  COUNTS:  YES  TOURNIQUET:  * No tourniquets in log *  DICTATION: .Note written in EPIC  PLAN OF CARE: Admit for overnight observation  PATIENT DISPOSITION:  PACU - hemodynamically stable.   Delay start of Pharmacological VTE agent (>24hrs) due to surgical blood loss or risk of bleeding: yes

## 2014-09-29 NOTE — Anesthesia Postprocedure Evaluation (Signed)
  Anesthesia Post-op Note  Patient: Lisa Lucero  Procedure(s) Performed: Procedure(s): L4-5 Decompression, Hemi-Laminectomy,  Removal Free Fragment (N/A)  Patient Location: PACU  Anesthesia Type:General  Level of Consciousness: awake and alert   Airway and Oxygen Therapy: Patient Spontanous Breathing  Post-op Pain: mild  Post-op Assessment: Post-op Vital signs reviewed, Patient's Cardiovascular Status Stable and Respiratory Function Stable  Post-op Vital Signs: Reviewed  Filed Vitals:   09/29/14 1526  BP: 179/85  Pulse: 58  Temp:   Resp: 13    Complications: No apparent anesthesia complications

## 2014-09-29 NOTE — Progress Notes (Signed)
Pt called to say that her IV was leaking or bleeding.  There was a small amount of red leakage under transparent dressing at the insertion site.  Dressing removed, site dried with 4x4.  No apparent leakage or bleeding, so redressed with transparent dressing.  Pt also stated that IV site was burning, and that she may want [place] to change it.  IV site unremarkable except for mention of previous leakage, and after redressing IV site, Pt stated it felt better, and did not want it changed at this time.

## 2014-09-29 NOTE — Interval H&P Note (Signed)
History and Physical Interval Note:  09/29/2014 12:25 PM  Lisa Lucero  has presented today for surgery, with the diagnosis of L4-5 Stenosis with Right HNP Free Fragment  The various methods of treatment have been discussed with the patient and family. After consideration of risks, benefits and other options for treatment, the patient has consented to  Procedure(s): L4-5 Decompression, Removal Free Fragment (N/A) as a surgical intervention .  The patient's history has been reviewed, patient examined, no change in status, stable for surgery.  I have reviewed the patient's chart and labs.  Questions were answered to the patient's satisfaction.     Jakyria Bleau C

## 2014-09-29 NOTE — Anesthesia Procedure Notes (Signed)
Procedure Name: Intubation Date/Time: 09/29/2014 1:06 PM Performed by: Carola Frost Pre-anesthesia Checklist: Patient identified, Timeout performed, Emergency Drugs available, Patient being monitored and Suction available Patient Re-evaluated:Patient Re-evaluated prior to inductionOxygen Delivery Method: Circle system utilized Preoxygenation: Pre-oxygenation with 100% oxygen Intubation Type: IV induction Ventilation: Mask ventilation without difficulty Laryngoscope Size: Mac and 3 Grade View: Grade I Tube type: Oral Tube size: 7.0 mm Number of attempts: 1 Airway Equipment and Method: Stylet Placement Confirmation: CO2 detector,  positive ETCO2,  ETT inserted through vocal cords under direct vision and breath sounds checked- equal and bilateral Secured at: 21 cm Tube secured with: Tape Dental Injury: Teeth and Oropharynx as per pre-operative assessment

## 2014-09-30 ENCOUNTER — Encounter (HOSPITAL_COMMUNITY): Payer: Self-pay | Admitting: Orthopaedic Surgery

## 2014-09-30 DIAGNOSIS — M5126 Other intervertebral disc displacement, lumbar region: Secondary | ICD-10-CM | POA: Diagnosis not present

## 2014-09-30 DIAGNOSIS — I10 Essential (primary) hypertension: Secondary | ICD-10-CM | POA: Diagnosis not present

## 2014-09-30 DIAGNOSIS — M1990 Unspecified osteoarthritis, unspecified site: Secondary | ICD-10-CM | POA: Diagnosis not present

## 2014-09-30 DIAGNOSIS — I4891 Unspecified atrial fibrillation: Secondary | ICD-10-CM | POA: Diagnosis not present

## 2014-09-30 DIAGNOSIS — Z87891 Personal history of nicotine dependence: Secondary | ICD-10-CM | POA: Diagnosis not present

## 2014-09-30 DIAGNOSIS — Z8711 Personal history of peptic ulcer disease: Secondary | ICD-10-CM | POA: Diagnosis not present

## 2014-09-30 DIAGNOSIS — Z8673 Personal history of transient ischemic attack (TIA), and cerebral infarction without residual deficits: Secondary | ICD-10-CM | POA: Diagnosis not present

## 2014-09-30 DIAGNOSIS — M4806 Spinal stenosis, lumbar region: Secondary | ICD-10-CM | POA: Diagnosis not present

## 2014-09-30 DIAGNOSIS — E039 Hypothyroidism, unspecified: Secondary | ICD-10-CM | POA: Diagnosis not present

## 2014-09-30 DIAGNOSIS — K219 Gastro-esophageal reflux disease without esophagitis: Secondary | ICD-10-CM | POA: Diagnosis not present

## 2014-09-30 DIAGNOSIS — D649 Anemia, unspecified: Secondary | ICD-10-CM | POA: Diagnosis not present

## 2014-09-30 LAB — PROTIME-INR
INR: 1.17 (ref 0.00–1.49)
Prothrombin Time: 14.9 seconds (ref 11.6–15.2)

## 2014-09-30 NOTE — Op Note (Signed)
Lisa Lucero, SANT NO.:  [id number]  MEDICAL RECORD NO.:  [id number]  LOCATION:  [id number]                        FACILITY:  [facility]  PHYSICIAN:  Sindy Mccune C. Lorin Mercy, M.D.    DATE OF BIRTH:  11-07-1935  DATE OF PROCEDURE:  09/29/2014 DATE OF DISCHARGE:                              OPERATIVE REPORT   PREOPERATIVE DIAGNOSIS:  Spinal stenosis L4-5 with neurogenic claudication and free fragment with cephalad migration, right L4-5.  POSTOPERATIVE DIAGNOSIS:  Spinal stenosis L4-5 with neurogenic claudication and free fragment with cephalad migration, right L4-5.  PROCEDURE:  Right L4 hemilaminectomy, L4-5 decompression, and removal of migrating free fragment.  SURGEON:  Tiani Stanbery C. Lorin Mercy, MD  ASSISTANT:  Epimenio Foot, PA-C, medically necessary and present for the entire procedure.  ESTIMATED BLOOD LOSS:  Minimal.  COMPLICATIONS:  None.  BRIEF HISTORY:  A 79 year old female has had known severe spinal stenosis with 80% compression of the canal space and then recently developed severe excruciating pain, leg weakness on the right, inability to ambulate with.  New MRI showing disc protrusion, had then ruptured with a large free fragment, which had migrated cephalad, past the pedicle into the gutter on the right side with severe compression.  She has had epidurals in the past without relief and is admitted for decompression and removal of the free fragment.  DESCRIPTION OF PROCEDURE:  After induction of general anesthesia, the patient was placed prone, careful padding and positioning.  Antibiotics were given prophylactically.  Time-out procedure completed, DuraPrep'ed. The area was squared with towels, Betadine, Steri-Drape.  Spinal needle placed directly over the 4-5 space with the __________ with easy localization.  Time-out procedure completed.  Incision was made, 2 mm right of the midline.  Subperiosteal dissection, self-retaining retractor was placed.  Lamina was  thinned with the burr removed with a Kerrison.  Patties were used to protect the dura.  Chunks of ligament were removed.  Dura appeared somewhat thin.  In the lateral gutter, there was large fragment that extended underneath the ligament with extruded piece more cephalad.  Fragments were grasped.  Operative microscope was brought in and directly was used to gently protect the dura.  Micropituitary was used.  Disk was entered.  Some chunks were removed, but primarily large fragment that had ruptured had adequately decompressed the disc with minimal disc material, still present inside the disc space.  Bone was removed out to the pedicle, all chunks of ligament were removed.  Lateral wall was palpated and smooth, passed hockey stick __________ the dura 108 degrees sweep with no areas of compression.  Epstein curette was used a little bit out on the right side laterally since there was some early foraminal disc protrusion. This was decompressed, irrigation with saline solution.  Some bands that extended from the right over to the left side, almost look like constricting bands were hooked with blunt nerve hook and pulled release, so the dura was a nice round shape with no areas of compression.  Irrigation with saline solution, closure of the fascia with 0 Vicryl, 2-0 Vicryl in subcutaneous tissue, 4-0 Vicryl subcuticular closure, Dermabond postop dressing and transferred to the recovery room.  The patient tolerated the  procedure well.     Rakayla Ricklefs C. Lorin Mercy, M.D.     MCY/MEDQ  D:  09/29/2014  T:  09/30/2014  Job:  [id number]

## 2014-09-30 NOTE — Progress Notes (Signed)
Utilization review completed.  

## 2014-09-30 NOTE — Progress Notes (Signed)
Subjective: 1 Day Post-Op Procedure(s) (LRB): L4-5 Decompression, Hemi-Laminectomy,  Removal Free Fragment (N/A) Patient reports pain as mild.    Objective: Vital signs in last 24 hours: Temp:  [97.3 F (36.3 C)-98.8 F (37.1 C)] 98.4 F (36.9 C) (10/01 0552) Pulse Rate:  [56-74] 56 (10/01 0552) Resp:  [8-24] 17 (10/01 0552) BP: (138-191)/(62-132) 141/76 mmHg (10/01 0552) SpO2:  [94 %-100 %] 97 % (10/01 0552) Weight:  [55.339 kg (122 lb)] 55.339 kg (122 lb) (09/30 1031)  Intake/Output from previous day: 09/30 0701 - 10/01 0700 In: 2600 [P.O.:100; I.V.:2500] Out: 500 [Urine:425; Blood:75] Intake/Output this shift:     Recent Labs  09/27/14 1508  HGB 8.9*    Recent Labs  09/27/14 1508  WBC 5.4  RBC 4.13  HCT 29.4*  PLT 363    Recent Labs  09/27/14 1508  NA 132*  K 3.9  CL 94*  CO2 25  BUN 16  CREATININE 0.88  GLUCOSE 99  CALCIUM 10.0    Recent Labs  09/27/14 1508 09/30/14 0624  INR 1.26 1.17    Neurologically intact  Assessment/Plan: 1 Day Post-Op Procedure(s) (LRB): L4-5 Decompression, Hemi-Laminectomy,  Removal Free Fragment (N/A) Plan   Discharge home  Power C 09/30/2014, 7:42 AM

## 2014-10-01 LAB — TYPE AND SCREEN
ABO/RH(D): O POS
Antibody Screen: NEGATIVE
UNIT DIVISION: 0
Unit division: 0

## 2014-10-04 ENCOUNTER — Ambulatory Visit (INDEPENDENT_AMBULATORY_CARE_PROVIDER_SITE_OTHER): Payer: Medicare Other

## 2014-10-04 DIAGNOSIS — I4891 Unspecified atrial fibrillation: Secondary | ICD-10-CM

## 2014-10-04 DIAGNOSIS — Z5181 Encounter for therapeutic drug level monitoring: Secondary | ICD-10-CM

## 2014-10-04 LAB — POCT INR: INR: 1.4

## 2014-10-11 ENCOUNTER — Ambulatory Visit (INDEPENDENT_AMBULATORY_CARE_PROVIDER_SITE_OTHER): Payer: Medicare Other | Admitting: *Deleted

## 2014-10-11 DIAGNOSIS — Z5181 Encounter for therapeutic drug level monitoring: Secondary | ICD-10-CM | POA: Diagnosis not present

## 2014-10-11 DIAGNOSIS — I4891 Unspecified atrial fibrillation: Secondary | ICD-10-CM

## 2014-10-11 LAB — POCT INR: INR: 3.4

## 2014-10-18 NOTE — Discharge Summary (Signed)
Physician Discharge Summary  Patient ID: Lisa Lucero MRN: [id number] DOB/AGE: 1935-12-08 79 y.o.  Admit date: 09/29/2014 Discharge date: 09/30/2014  Admission Diagnoses:  HNP (herniated nucleus pulposus), lumbar right L4-5  Discharge Diagnoses:  Principal Problem:   HNP (herniated nucleus pulposus), lumbar   Past Medical History  Diagnosis Date  . Paroxysmal atrial fibrillation   . History of embolic stroke 02/5700  . Erosive gastritis     with GI bleed thought due to elevated INR and duodenitis  . Hyperthyroidism   . Peripheral edema   . Long-term (current) use of anticoagulants   . HTN (hypertension)   . Stroke   . GERD (gastroesophageal reflux disease)   . Back pain   . COPD (chronic obstructive pulmonary disease)     pt denies on 09/27/14  . H/O hypercholesterolemia     PMH: only  . Arthritis   . Anemia     Iron deficiency anemia  . Hypothyroidism     Surgeries: Procedure(s): L4-5 Decompression, Hemi-Laminectomy,  Removal Free Fragment right L4-5 on 09/29/2014   Consultants (if any):  none  Discharged Condition: Improved  Hospital Course: Lisa Lucero is an 79 y.o. female who was admitted 09/29/2014 with a diagnosis of HNP (herniated nucleus pulposus), lumbar and went to the operating room on 09/29/2014 and underwent the above named procedures.    She was given perioperative antibiotics:  Anti-infectives   Start     Dose/Rate Route Frequency Ordered Stop   09/29/14 2200  ceFAZolin (ANCEF) IVPB 1 g/50 mL premix     1 g 100 mL/hr over 30 Minutes Intravenous Every 8 hours 09/29/14 1809 09/30/14 0559   09/29/14 0600  ceFAZolin (ANCEF) IVPB 2 g/50 mL premix     2 g 100 mL/hr over 30 Minutes Intravenous On call to O.R. 09/28/14 1337 09/29/14 1318    .  She was given sequential compression devices, early ambulation, and coumadin for DVT prophylaxis.  She benefited maximally from the hospital stay and there were no complications.    Recent vital signs:   Filed Vitals:   09/30/14 0552  BP: 141/76  Pulse: 56  Temp: 98.4 F (36.9 C)  Resp: 17    Recent laboratory studies:  Lab Results  Component Value Date   HGB 8.9* 09/27/2014   HGB 11.4* 03/13/2012   HGB 10.6* 03/12/2012   Lab Results  Component Value Date   WBC 5.4 09/27/2014   PLT 363 09/27/2014   Lab Results  Component Value Date   INR 3.4 10/11/2014   Lab Results  Component Value Date   NA 132* 09/27/2014   K 3.9 09/27/2014   CL 94* 09/27/2014   CO2 25 09/27/2014   BUN 16 09/27/2014   CREATININE 0.88 09/27/2014   GLUCOSE 99 09/27/2014    Discharge Medications:     Medication List    STOP taking these medications       enoxaparin 60 MG/0.6ML injection  Commonly known as:  LOVENOX      TAKE these medications       CALCIUM 500 PO  Take 1,000 mg by mouth daily.     diltiazem 240 MG 24 hr tablet  Commonly known as:  CARDIZEM LA  Take 240 mg by mouth daily.     ferrous sulfate 325 (65 FE) MG tablet  Take 325 mg by mouth daily.     hydrochlorothiazide 25 MG tablet  Commonly known as:  HYDRODIURIL  Take 12.5 mg by mouth daily.  hydrocortisone 25 MG suppository  Commonly known as:  ANUSOL-HC  Place 25 mg rectally 2 (two) times daily as needed for hemorrhoids.     levothyroxine 25 MCG tablet  Commonly known as:  SYNTHROID, LEVOTHROID  Take 25 mcg by mouth daily before breakfast.     lisinopril 20 MG tablet  Commonly known as:  PRINIVIL,ZESTRIL  Take 20 mg by mouth 2 (two) times daily.     metoprolol 50 MG tablet  Commonly known as:  LOPRESSOR  Take 50 mg by mouth 2 (two) times daily.     multivitamin with minerals Tabs tablet  Take 1 tablet by mouth daily.     nitroGLYCERIN 0.4 MG SL tablet  Commonly known as:  NITROSTAT  Place 1 tablet (0.4 mg total) under the tongue every 5 (five) minutes as needed for chest pain.     oxyCODONE-acetaminophen 5-325 MG per tablet  Commonly known as:  PERCOCET/ROXICET  Take 1-2 tablets by mouth every 6 (six)  hours as needed for severe pain.     oxyCODONE-acetaminophen 5-325 MG per tablet  Commonly known as:  ROXICET  Take 1-2 tablets by mouth every 4 (four) hours as needed.     pantoprazole 40 MG tablet  Commonly known as:  PROTONIX  Take 40 mg by mouth daily.     potassium chloride 10 MEQ tablet  Commonly known as:  K-DUR,KLOR-CON  Take 1 tablet (10 mEq total) by mouth 2 (two) times daily.     THERA TEARS ALLERGY 0.025 % ophthalmic solution  Generic drug:  ketotifen  Place 1 drop into both eyes 2 (two) times daily as needed (for dry eyes).     warfarin 5 MG tablet  Commonly known as:  COUMADIN  Take 0.5-1 tablets (2.5-5 mg total) by mouth daily.     zolpidem 6.25 MG CR tablet  Commonly known as:  AMBIEN CR  Take 6.25 mg by mouth at bedtime as needed for sleep.        Diagnostic Studies: Dg Chest 2 View  09/27/2014   CLINICAL DATA:  Preoperative films.  Patient for lumbar surgery.  EXAM: CHEST  2 VIEW  COMPARISON:  Single view of the chest 12/06/2010.  FINDINGS: The lungs are clear. Heart size is normal. No pneumothorax or pleural effusion. Calcified breast implants are noted.  IMPRESSION: No acute disease.   Electronically Signed   By: Inge Rise M.D.   On: 09/27/2014 15:43   Dg Lumbar Spine 2-3 Views  09/29/2014   CLINICAL DATA:  Surgical localization imaging for lumbar laminectomy.  EXAM: LUMBAR SPINE - 2-3 VIEW  COMPARISON:  Lumbar MRI, 09/12/2014  FINDINGS: Initial lateral cross-table view of the lumbar spine shows a surgical needle with its tip posterior to the L4-L5 disc, superimposed along the inferior margin of the L4 spinous process. Followup image shows placement of a surgical probe with its tip posterior to the L4-L5 disc.  IMPRESSION: Localization images for lumbar spine surgery as described.   Electronically Signed   By: Lajean Manes M.D.   On: 09/29/2014 14:36    Disposition: 01-Home or Self Care  DISCHARGE INSTRUCTIONS: No lifting greater than 10 lbs. Avoid  bending, stooping and twisting. Walk in house for first week them may start to get out slowly increasing distance up to one mile by 3 weeks post op. Keep incision dry for 3 days, may use tegaderm or similar water impervious dressing. Change dressing daily or as needed.  Ice packs as needed for pain  and swelling.       Follow-up Information   Follow up with Marybelle Killings, MD. Schedule an appointment as soon as possible for a visit in 2 weeks.   Specialty:  Orthopedic Surgery   Contact information:   [place] [address] [phone]        Signed: Epimenio Foot 10/18/2014, 4:29 PM

## 2014-10-21 DIAGNOSIS — S82012A Displaced osteochondral fracture of left patella, initial encounter for closed fracture: Secondary | ICD-10-CM | POA: Diagnosis not present

## 2014-10-22 ENCOUNTER — Encounter (HOSPITAL_COMMUNITY): Payer: Self-pay | Admitting: *Deleted

## 2014-10-22 ENCOUNTER — Encounter (HOSPITAL_COMMUNITY): Payer: Self-pay | Admitting: Pharmacy Technician

## 2014-10-22 ENCOUNTER — Other Ambulatory Visit (HOSPITAL_COMMUNITY): Payer: Self-pay | Admitting: Orthopaedic Surgery

## 2014-10-22 NOTE — Progress Notes (Signed)
Pt denies SOB and chest pain. Pt under the care of Dr. Mare Ferrari ( cardiology). Pt stated that she was instructed to stop Coumadin with no bridge; last dose 10/20/14.

## 2014-10-25 ENCOUNTER — Ambulatory Visit (HOSPITAL_COMMUNITY): Payer: Medicare Other

## 2014-10-25 ENCOUNTER — Encounter (HOSPITAL_COMMUNITY)
Admission: RE | Disposition: A | Discharge status: Home Health | Payer: Self-pay | Source: Ambulatory Visit | Attending: Orthopaedic Surgery

## 2014-10-25 ENCOUNTER — Encounter (HOSPITAL_COMMUNITY): Payer: Self-pay | Admitting: *Deleted

## 2014-10-25 ENCOUNTER — Encounter (HOSPITAL_COMMUNITY): Payer: Medicare Other | Admitting: Emergency Medicine

## 2014-10-25 ENCOUNTER — Inpatient Hospital Stay (HOSPITAL_COMMUNITY)
Admission: RE | Admit: 2014-10-25 | Discharge: 2014-10-27 | DRG: 517 | Disposition: A | Discharge status: Home Health | Payer: Medicare Other | Source: Ambulatory Visit | Attending: Orthopaedic Surgery | Admitting: Orthopaedic Surgery

## 2014-10-25 ENCOUNTER — Telehealth: Payer: Self-pay | Admitting: *Deleted

## 2014-10-25 ENCOUNTER — Ambulatory Visit (HOSPITAL_COMMUNITY): Payer: Medicare Other | Admitting: Emergency Medicine

## 2014-10-25 DIAGNOSIS — S82002B Unspecified fracture of left patella, initial encounter for open fracture type I or II: Secondary | ICD-10-CM | POA: Diagnosis not present

## 2014-10-25 DIAGNOSIS — J449 Chronic obstructive pulmonary disease, unspecified: Secondary | ICD-10-CM | POA: Diagnosis present

## 2014-10-25 DIAGNOSIS — G8918 Other acute postprocedural pain: Secondary | ICD-10-CM | POA: Diagnosis not present

## 2014-10-25 DIAGNOSIS — S82032A Displaced transverse fracture of left patella, initial encounter for closed fracture: Principal | ICD-10-CM | POA: Diagnosis present

## 2014-10-25 DIAGNOSIS — Z8673 Personal history of transient ischemic attack (TIA), and cerebral infarction without residual deficits: Secondary | ICD-10-CM

## 2014-10-25 DIAGNOSIS — Z823 Family history of stroke: Secondary | ICD-10-CM | POA: Diagnosis not present

## 2014-10-25 DIAGNOSIS — Z87891 Personal history of nicotine dependence: Secondary | ICD-10-CM | POA: Diagnosis not present

## 2014-10-25 DIAGNOSIS — Z7901 Long term (current) use of anticoagulants: Secondary | ICD-10-CM

## 2014-10-25 DIAGNOSIS — I10 Essential (primary) hypertension: Secondary | ICD-10-CM | POA: Diagnosis present

## 2014-10-25 DIAGNOSIS — E039 Hypothyroidism, unspecified: Secondary | ICD-10-CM | POA: Diagnosis present

## 2014-10-25 DIAGNOSIS — S82012D Displaced osteochondral fracture of left patella, subsequent encounter for closed fracture with routine healing: Secondary | ICD-10-CM | POA: Diagnosis not present

## 2014-10-25 DIAGNOSIS — I480 Paroxysmal atrial fibrillation: Secondary | ICD-10-CM | POA: Diagnosis present

## 2014-10-25 DIAGNOSIS — K219 Gastro-esophageal reflux disease without esophagitis: Secondary | ICD-10-CM | POA: Diagnosis present

## 2014-10-25 DIAGNOSIS — W19XXXA Unspecified fall, initial encounter: Secondary | ICD-10-CM | POA: Diagnosis present

## 2014-10-25 DIAGNOSIS — S82002A Unspecified fracture of left patella, initial encounter for closed fracture: Secondary | ICD-10-CM | POA: Diagnosis not present

## 2014-10-25 HISTORY — PX: ORIF PATELLA: SHX5033

## 2014-10-25 LAB — PREPARE RBC (CROSSMATCH)

## 2014-10-25 LAB — CBC
HCT: 25 % — ABNORMAL LOW (ref 36.0–46.0)
Hemoglobin: 7.6 g/dL — ABNORMAL LOW (ref 12.0–15.0)
MCH: 22.2 pg — AB (ref 26.0–34.0)
MCHC: 30.4 g/dL (ref 30.0–36.0)
MCV: 73.1 fL — ABNORMAL LOW (ref 78.0–100.0)
Platelets: 381 10*3/uL (ref 150–400)
RBC: 3.42 MIL/uL — ABNORMAL LOW (ref 3.87–5.11)
RDW: 19.9 % — ABNORMAL HIGH (ref 11.5–15.5)
WBC: 4.2 10*3/uL (ref 4.0–10.5)

## 2014-10-25 LAB — COMPREHENSIVE METABOLIC PANEL
ALK PHOS: 50 U/L (ref 39–117)
ALT: 11 U/L (ref 0–35)
AST: 20 U/L (ref 0–37)
Albumin: 3.7 g/dL (ref 3.5–5.2)
Anion gap: 15 (ref 5–15)
BILIRUBIN TOTAL: 0.9 mg/dL (ref 0.3–1.2)
BUN: 18 mg/dL (ref 6–23)
CALCIUM: 9.7 mg/dL (ref 8.4–10.5)
CO2: 22 meq/L (ref 19–32)
Chloride: 96 mEq/L (ref 96–112)
Creatinine, Ser: 0.89 mg/dL (ref 0.50–1.10)
GFR, EST AFRICAN AMERICAN: 71 mL/min — AB (ref >90)
GFR, EST NON AFRICAN AMERICAN: 61 mL/min — AB (ref >90)
Glucose, Bld: 99 mg/dL (ref 70–99)
POTASSIUM: 3.6 meq/L — AB (ref 3.7–5.3)
SODIUM: 133 meq/L — AB (ref 137–147)
Total Protein: 6.4 g/dL (ref 6.0–8.3)

## 2014-10-25 LAB — PROTIME-INR
INR: 1.11 (ref 0.00–1.49)
Prothrombin Time: 14.4 seconds (ref 11.6–15.2)

## 2014-10-25 LAB — APTT: aPTT: 34 seconds (ref 24–37)

## 2014-10-25 SURGERY — OPEN REDUCTION INTERNAL FIXATION (ORIF) PATELLA
Anesthesia: General | Laterality: Left

## 2014-10-25 MED ORDER — GLYCOPYRROLATE 0.2 MG/ML IJ SOLN
INTRAMUSCULAR | Status: AC
  Filled 2014-10-25: qty 2

## 2014-10-25 MED ORDER — LISINOPRIL 20 MG PO TABS
20.0000 mg | ORAL_TABLET | Freq: Two times a day (BID) | ORAL | Status: DC
  Administered 2014-10-25 – 2014-10-27 (×4): 20 mg via ORAL
  Filled 2014-10-25 (×4): qty 1

## 2014-10-25 MED ORDER — HYDROCORTISONE ACETATE 25 MG RE SUPP
25.0000 mg | Freq: Two times a day (BID) | RECTAL | Status: DC | PRN
  Filled 2014-10-25: qty 1

## 2014-10-25 MED ORDER — LACTATED RINGERS IV SOLN
INTRAVENOUS | Status: DC
  Administered 2014-10-25: 10:00:00 via INTRAVENOUS

## 2014-10-25 MED ORDER — NEOSTIGMINE METHYLSULFATE 10 MG/10ML IV SOLN
INTRAVENOUS | Status: AC
  Filled 2014-10-25: qty 1

## 2014-10-25 MED ORDER — HYDROCHLOROTHIAZIDE 25 MG PO TABS
12.5000 mg | ORAL_TABLET | Freq: Every day | ORAL | Status: DC
  Administered 2014-10-26 – 2014-10-27 (×2): 12.5 mg via ORAL
  Filled 2014-10-25 (×2): qty 1

## 2014-10-25 MED ORDER — METOCLOPRAMIDE HCL 5 MG/ML IJ SOLN: 5.0000 mg | Freq: Three times a day (TID) | INTRAMUSCULAR | Status: DC | PRN

## 2014-10-25 MED ORDER — DILTIAZEM HCL ER COATED BEADS 240 MG PO CP24
240.0000 mg | ORAL_CAPSULE | Freq: Every day | ORAL | Status: DC
  Administered 2014-10-26 – 2014-10-27 (×2): 240 mg via ORAL
  Filled 2014-10-25 (×2): qty 1

## 2014-10-25 MED ORDER — MIDAZOLAM HCL 2 MG/2ML IJ SOLN
INTRAMUSCULAR | Status: AC
  Filled 2014-10-25: qty 2

## 2014-10-25 MED ORDER — SENNOSIDES-DOCUSATE SODIUM 8.6-50 MG PO TABS
1.0000 | ORAL_TABLET | Freq: Every evening | ORAL | Status: DC | PRN
Start: 2014-10-25 — End: 2014-10-27

## 2014-10-25 MED ORDER — LIDOCAINE HCL (CARDIAC) 20 MG/ML IV SOLN
INTRAVENOUS | Status: AC
  Filled 2014-10-25: qty 5

## 2014-10-25 MED ORDER — NITROGLYCERIN 0.4 MG SL SUBL: 0.4000 mg | SUBLINGUAL_TABLET | SUBLINGUAL | Status: DC | PRN

## 2014-10-25 MED ORDER — MIDAZOLAM HCL 5 MG/5ML IJ SOLN
INTRAMUSCULAR | Status: DC | PRN
  Administered 2014-10-25: 1 mg via INTRAVENOUS

## 2014-10-25 MED ORDER — ONDANSETRON HCL 4 MG/2ML IJ SOLN
INTRAMUSCULAR | Status: DC | PRN
  Administered 2014-10-25: 4 mg via INTRAVENOUS

## 2014-10-25 MED ORDER — FENTANYL CITRATE 0.05 MG/ML IJ SOLN
INTRAMUSCULAR | Status: DC | PRN
  Administered 2014-10-25: 100 ug via INTRAVENOUS
  Administered 2014-10-25 (×2): 50 ug via INTRAVENOUS

## 2014-10-25 MED ORDER — FLEET ENEMA 7-19 GM/118ML RE ENEM: 1.0000 | ENEMA | Freq: Once | RECTAL | Status: AC | PRN

## 2014-10-25 MED ORDER — FENTANYL CITRATE 0.05 MG/ML IJ SOLN
25.0000 ug | INTRAMUSCULAR | Status: DC | PRN
  Administered 2014-10-25 (×2): 50 ug via INTRAVENOUS

## 2014-10-25 MED ORDER — DIPHENHYDRAMINE HCL 12.5 MG/5ML PO ELIX: 12.5000 mg | ORAL_SOLUTION | ORAL | Status: DC | PRN

## 2014-10-25 MED ORDER — GLYCOPYRROLATE 0.2 MG/ML IJ SOLN
INTRAMUSCULAR | Status: DC | PRN
  Administered 2014-10-25: 0.4 mg via INTRAVENOUS

## 2014-10-25 MED ORDER — FENTANYL CITRATE 0.05 MG/ML IJ SOLN
INTRAMUSCULAR | Status: AC
  Filled 2014-10-25: qty 5

## 2014-10-25 MED ORDER — OXYCODONE HCL 5 MG PO TABS: 5.0000 mg | ORAL_TABLET | Freq: Once | ORAL | Status: DC | PRN

## 2014-10-25 MED ORDER — LIDOCAINE HCL (CARDIAC) 20 MG/ML IV SOLN
INTRAVENOUS | Status: DC | PRN
  Administered 2014-10-25: 30 mg via INTRAVENOUS

## 2014-10-25 MED ORDER — WARFARIN SODIUM 5 MG PO TABS
5.0000 mg | ORAL_TABLET | Freq: Once | ORAL | Status: AC
  Administered 2014-10-25: 5 mg via ORAL
  Filled 2014-10-25: qty 1

## 2014-10-25 MED ORDER — DILTIAZEM HCL ER COATED BEADS 240 MG PO TB24: 240.0000 mg | ORAL_TABLET | Freq: Every day | ORAL | Status: DC

## 2014-10-25 MED ORDER — PROPOFOL 10 MG/ML IV BOLUS
INTRAVENOUS | Status: DC | PRN
  Administered 2014-10-25: 150 mg via INTRAVENOUS

## 2014-10-25 MED ORDER — MORPHINE SULFATE 2 MG/ML IJ SOLN: 1.0000 mg | INTRAMUSCULAR | Status: DC | PRN

## 2014-10-25 MED ORDER — DEXAMETHASONE SODIUM PHOSPHATE 4 MG/ML IJ SOLN
INTRAMUSCULAR | Status: DC | PRN
  Administered 2014-10-25: 8 mg via INTRAVENOUS

## 2014-10-25 MED ORDER — PROMETHAZINE HCL 25 MG/ML IJ SOLN: 6.2500 mg | INTRAMUSCULAR | Status: DC | PRN

## 2014-10-25 MED ORDER — FERROUS SULFATE 325 (65 FE) MG PO TABS
325.0000 mg | ORAL_TABLET | Freq: Every day | ORAL | Status: DC
  Administered 2014-10-26 – 2014-10-27 (×2): 325 mg via ORAL
  Filled 2014-10-25 (×2): qty 1

## 2014-10-25 MED ORDER — KETOTIFEN FUMARATE 0.025 % OP SOLN
1.0000 [drp] | Freq: Two times a day (BID) | OPHTHALMIC | Status: DC | PRN
  Filled 2014-10-25: qty 5

## 2014-10-25 MED ORDER — LEVOTHYROXINE SODIUM 25 MCG PO TABS
25.0000 ug | ORAL_TABLET | Freq: Every day | ORAL | Status: DC
  Administered 2014-10-26 – 2014-10-27 (×2): 25 ug via ORAL
  Filled 2014-10-25 (×2): qty 1

## 2014-10-25 MED ORDER — OXYCODONE-ACETAMINOPHEN 5-325 MG PO TABS
1.0000 | ORAL_TABLET | ORAL | Status: DC | PRN
  Administered 2014-10-25 – 2014-10-27 (×10): 2 via ORAL
  Filled 2014-10-25 (×10): qty 2

## 2014-10-25 MED ORDER — LACTATED RINGERS IV SOLN
INTRAVENOUS | Status: DC | PRN
  Administered 2014-10-25: 12:00:00 via INTRAVENOUS

## 2014-10-25 MED ORDER — WARFARIN - PHARMACIST DOSING INPATIENT: Freq: Every day | Status: DC

## 2014-10-25 MED ORDER — POTASSIUM CHLORIDE CRYS ER 10 MEQ PO TBCR
10.0000 meq | EXTENDED_RELEASE_TABLET | Freq: Two times a day (BID) | ORAL | Status: DC
  Administered 2014-10-25 – 2014-10-27 (×4): 10 meq via ORAL
  Filled 2014-10-25 (×4): qty 1

## 2014-10-25 MED ORDER — MEPERIDINE HCL 25 MG/ML IJ SOLN: 6.2500 mg | INTRAMUSCULAR | Status: DC | PRN

## 2014-10-25 MED ORDER — KCL IN DEXTROSE-NACL 20-5-0.45 MEQ/L-%-% IV SOLN
INTRAVENOUS | Status: DC
  Administered 2014-10-25: 20:00:00 via INTRAVENOUS
  Filled 2014-10-25: qty 1000

## 2014-10-25 MED ORDER — METHOCARBAMOL 1000 MG/10ML IJ SOLN
500.0000 mg | Freq: Four times a day (QID) | INTRAVENOUS | Status: DC | PRN
  Administered 2014-10-25: 500 mg via INTRAVENOUS
  Filled 2014-10-25: qty 5

## 2014-10-25 MED ORDER — EPHEDRINE SULFATE 50 MG/ML IJ SOLN
INTRAMUSCULAR | Status: DC | PRN
  Administered 2014-10-25: 10 mg via INTRAVENOUS

## 2014-10-25 MED ORDER — BUPIVACAINE-EPINEPHRINE (PF) 0.5% -1:200000 IJ SOLN
INTRAMUSCULAR | Status: DC | PRN
  Administered 2014-10-25: 30 mL via PERINEURAL

## 2014-10-25 MED ORDER — BISACODYL 10 MG RE SUPP: 10.0000 mg | Freq: Every day | RECTAL | Status: DC | PRN

## 2014-10-25 MED ORDER — PANTOPRAZOLE SODIUM 40 MG PO TBEC
40.0000 mg | DELAYED_RELEASE_TABLET | Freq: Every day | ORAL | Status: DC
Start: 2014-10-26 — End: 2014-10-27
  Administered 2014-10-26 – 2014-10-27 (×2): 40 mg via ORAL
  Filled 2014-10-25 (×2): qty 1

## 2014-10-25 MED ORDER — DOCUSATE SODIUM 100 MG PO CAPS
100.0000 mg | ORAL_CAPSULE | Freq: Two times a day (BID) | ORAL | Status: DC
  Administered 2014-10-25 – 2014-10-27 (×4): 100 mg via ORAL
  Filled 2014-10-25 (×4): qty 1

## 2014-10-25 MED ORDER — CEFAZOLIN SODIUM 1-5 GM-% IV SOLN
1.0000 g | Freq: Four times a day (QID) | INTRAVENOUS | Status: AC
  Administered 2014-10-25 – 2014-10-26 (×3): 1 g via INTRAVENOUS
  Filled 2014-10-25 (×3): qty 50

## 2014-10-25 MED ORDER — SODIUM CHLORIDE 0.9 % IV SOLN: 10.0000 mL/h | Freq: Once | INTRAVENOUS | Status: DC

## 2014-10-25 MED ORDER — DEXAMETHASONE SODIUM PHOSPHATE 4 MG/ML IJ SOLN
INTRAMUSCULAR | Status: AC
  Filled 2014-10-25: qty 2

## 2014-10-25 MED ORDER — ONDANSETRON HCL 4 MG PO TABS: 4.0000 mg | ORAL_TABLET | Freq: Four times a day (QID) | ORAL | Status: DC | PRN

## 2014-10-25 MED ORDER — ONDANSETRON HCL 4 MG/2ML IJ SOLN
INTRAMUSCULAR | Status: AC
  Filled 2014-10-25: qty 2

## 2014-10-25 MED ORDER — HYDROCODONE-ACETAMINOPHEN 5-325 MG PO TABS: 1.0000 | ORAL_TABLET | ORAL | Status: DC | PRN

## 2014-10-25 MED ORDER — ADULT MULTIVITAMIN W/MINERALS CH
1.0000 | ORAL_TABLET | Freq: Every day | ORAL | Status: DC
  Administered 2014-10-26 – 2014-10-27 (×2): 1 via ORAL
  Filled 2014-10-25 (×2): qty 1

## 2014-10-25 MED ORDER — METOPROLOL TARTRATE 50 MG PO TABS
50.0000 mg | ORAL_TABLET | Freq: Two times a day (BID) | ORAL | Status: DC
  Administered 2014-10-25 – 2014-10-27 (×4): 50 mg via ORAL
  Filled 2014-10-25 (×4): qty 1

## 2014-10-25 MED ORDER — OXYCODONE HCL 5 MG/5ML PO SOLN: 5.0000 mg | Freq: Once | ORAL | Status: DC | PRN

## 2014-10-25 MED ORDER — ZOLPIDEM TARTRATE 5 MG PO TABS
5.0000 mg | ORAL_TABLET | Freq: Every evening | ORAL | Status: DC | PRN
  Administered 2014-10-25 – 2014-10-27 (×2): 5 mg via ORAL
  Filled 2014-10-25 (×2): qty 1

## 2014-10-25 MED ORDER — ROCURONIUM BROMIDE 100 MG/10ML IV SOLN
INTRAVENOUS | Status: DC | PRN
  Administered 2014-10-25: 40 mg via INTRAVENOUS

## 2014-10-25 MED ORDER — FENTANYL CITRATE 0.05 MG/ML IJ SOLN
INTRAMUSCULAR | Status: AC
  Filled 2014-10-25: qty 2

## 2014-10-25 MED ORDER — METHOCARBAMOL 500 MG PO TABS
500.0000 mg | ORAL_TABLET | Freq: Four times a day (QID) | ORAL | Status: DC | PRN
  Administered 2014-10-26 – 2014-10-27 (×3): 500 mg via ORAL
  Filled 2014-10-25 (×4): qty 1

## 2014-10-25 MED ORDER — PROPOFOL 10 MG/ML IV BOLUS
INTRAVENOUS | Status: AC
  Filled 2014-10-25: qty 20

## 2014-10-25 MED ORDER — FENTANYL CITRATE 0.05 MG/ML IJ SOLN
INTRAMUSCULAR | Status: AC
  Administered 2014-10-25: 100 ug
  Filled 2014-10-25: qty 2

## 2014-10-25 MED ORDER — METOCLOPRAMIDE HCL 5 MG PO TABS: 5.0000 mg | ORAL_TABLET | Freq: Three times a day (TID) | ORAL | Status: DC | PRN

## 2014-10-25 MED ORDER — NEOSTIGMINE METHYLSULFATE 10 MG/10ML IV SOLN
INTRAVENOUS | Status: DC | PRN
  Administered 2014-10-25: 3 mg via INTRAVENOUS

## 2014-10-25 MED ORDER — MIDAZOLAM HCL 2 MG/2ML IJ SOLN: 0.5000 mg | Freq: Once | INTRAMUSCULAR | Status: DC | PRN

## 2014-10-25 MED ORDER — CEFAZOLIN SODIUM-DEXTROSE 2-3 GM-% IV SOLR
INTRAVENOUS | Status: AC
  Administered 2014-10-25: 2 g via INTRAVENOUS
  Filled 2014-10-25: qty 50

## 2014-10-25 MED ORDER — ONDANSETRON HCL 4 MG/2ML IJ SOLN: 4.0000 mg | Freq: Four times a day (QID) | INTRAMUSCULAR | Status: DC | PRN

## 2014-10-25 MED ORDER — OXYCODONE-ACETAMINOPHEN 5-325 MG PO TABS: 1.0000 | ORAL_TABLET | ORAL | Status: DC | PRN

## 2014-10-25 MED ORDER — ROCURONIUM BROMIDE 50 MG/5ML IV SOLN
INTRAVENOUS | Status: AC
  Filled 2014-10-25: qty 1

## 2014-10-25 MED ORDER — CEFAZOLIN SODIUM-DEXTROSE 2-3 GM-% IV SOLR: 2.0000 g | INTRAVENOUS | Status: DC

## 2014-10-25 SURGICAL SUPPLY — 66 items
1.6mm K-wire ×6 IMPLANT
2.7 CANNULATED DRILL BIT ×2 IMPLANT
BANDAGE ELASTIC 4 VELCRO ST LF (GAUZE/BANDAGES/DRESSINGS) ×3 IMPLANT
BANDAGE ELASTIC 6 VELCRO ST LF (GAUZE/BANDAGES/DRESSINGS) ×3 IMPLANT
BANDAGE ESMARK 6X9 LF (GAUZE/BANDAGES/DRESSINGS) ×1 IMPLANT
BIT DRILL 2.7XCANN QCK CNCT (BIT) IMPLANT
BIT DRILL CANN 2.7 (BIT) ×2
BIT DRILL CANN 2.7MM (BIT) ×1
BIT DRL 2.7XCANN QCK CNCT (BIT) ×1
BLADE SURG ROTATE 9660 (MISCELLANEOUS) ×3 IMPLANT
BNDG CMPR 9X6 STRL LF SNTH (GAUZE/BANDAGES/DRESSINGS) ×1
BNDG ESMARK 6X9 LF (GAUZE/BANDAGES/DRESSINGS) ×3
CANISTER SUCTION 2500CC (MISCELLANEOUS) ×1 IMPLANT
COVER SURGICAL LIGHT HANDLE (MISCELLANEOUS) ×3 IMPLANT
CUFF TOURNIQUET SINGLE 34IN LL (TOURNIQUET CUFF) ×2 IMPLANT
DRAPE C-ARM 42X72 X-RAY (DRAPES) ×3 IMPLANT
DRAPE C-ARMOR (DRAPES) ×3 IMPLANT
DRAPE U-SHAPE 47X51 STRL (DRAPES) ×3 IMPLANT
DRSG ADAPTIC 3X8 NADH LF (GAUZE/BANDAGES/DRESSINGS) ×3 IMPLANT
DRSG EMULSION OIL 3X3 NADH (GAUZE/BANDAGES/DRESSINGS) ×3 IMPLANT
DRSG PAD ABDOMINAL 8X10 ST (GAUZE/BANDAGES/DRESSINGS) ×3 IMPLANT
ELECT REM PT RETURN 9FT ADLT (ELECTROSURGICAL) ×3
ELECTRODE REM PT RTRN 9FT ADLT (ELECTROSURGICAL) ×1 IMPLANT
GLOVE BIOGEL PI IND STRL 7.5 (GLOVE) ×1 IMPLANT
GLOVE BIOGEL PI IND STRL 8 (GLOVE) ×1 IMPLANT
GLOVE BIOGEL PI INDICATOR 7.5 (GLOVE) ×2
GLOVE BIOGEL PI INDICATOR 8 (GLOVE) ×2
GLOVE ECLIPSE 7.0 STRL STRAW (GLOVE) ×3 IMPLANT
GLOVE ORTHO TXT STRL SZ7.5 (GLOVE) ×3 IMPLANT
GOWN STRL REUS W/ TWL LRG LVL3 (GOWN DISPOSABLE) IMPLANT
GOWN STRL REUS W/ TWL XL LVL3 (GOWN DISPOSABLE) ×2 IMPLANT
GOWN STRL REUS W/TWL LRG LVL3 (GOWN DISPOSABLE) ×6
GOWN STRL REUS W/TWL XL LVL3 (GOWN DISPOSABLE) ×6
GUIDEWIRE PIN ORTH 6X1.6XSMTH (WIRE) IMPLANT
K-WIRE .062 (WIRE) ×6
K-WIRE 1.6 (WIRE) ×3
K-WIRE FX6X.062X2 END TROC (WIRE) ×2
KIT BASIN OR (CUSTOM PROCEDURE TRAY) ×3 IMPLANT
KIT ROOM TURNOVER OR (KITS) ×3 IMPLANT
KWIRE FX6X.062X2 END TROC (WIRE) IMPLANT
MANIFOLD NEPTUNE II (INSTRUMENTS) ×2 IMPLANT
NEEDLE 22X1 1/2 (OR ONLY) (NEEDLE) IMPLANT
NS IRRIG 1000ML POUR BTL (IV SOLUTION) ×3 IMPLANT
PACK ORTHO EXTREMITY (CUSTOM PROCEDURE TRAY) ×3 IMPLANT
PAD ARMBOARD 7.5X6 YLW CONV (MISCELLANEOUS) ×6 IMPLANT
PAD CAST 4YDX4 CTTN HI CHSV (CAST SUPPLIES) ×2 IMPLANT
PADDING CAST COTTON 4X4 STRL (CAST SUPPLIES) ×3
PADDING CAST COTTON 6X4 STRL (CAST SUPPLIES) ×2 IMPLANT
SCREW CANN 4X42 (Screw) ×1 IMPLANT
SCREW CANN 4X46 (Screw) ×2 IMPLANT
SPONGE LAP 4X18 X RAY DECT (DISPOSABLE) ×3 IMPLANT
STAPLER VISISTAT 35W (STAPLE) ×3 IMPLANT
SUCTION FRAZIER TIP 10 FR DISP (SUCTIONS) ×3 IMPLANT
SUT VIC AB 0 CT1 27 (SUTURE) ×3
SUT VIC AB 0 CT1 27XBRD ANBCTR (SUTURE) ×1 IMPLANT
SUT VIC AB 2-0 CT1 27 (SUTURE) ×3
SUT VIC AB 2-0 CT1 TAPERPNT 27 (SUTURE) ×1 IMPLANT
SYR CONTROL 10ML LL (SYRINGE) IMPLANT
TOWEL OR 17X24 6PK STRL BLUE (TOWEL DISPOSABLE) ×3 IMPLANT
TOWEL OR 17X26 10 PK STRL BLUE (TOWEL DISPOSABLE) ×3 IMPLANT
TUBE CONNECTING 12'X1/4 (SUCTIONS) ×1
TUBE CONNECTING 12X1/4 (SUCTIONS) ×2 IMPLANT
UNDERPAD 30X30 INCONTINENT (UNDERPADS AND DIAPERS) ×3 IMPLANT
WATER STERILE IRR 1000ML POUR (IV SOLUTION) ×1 IMPLANT
WIRE 18GA COCR 1PK (WIRE) ×2 IMPLANT
YANKAUER SUCT BULB TIP NO VENT (SUCTIONS) IMPLANT

## 2014-10-25 NOTE — Discharge Instructions (Signed)
TOUCH DOWN WEIGHT BEARING ON LEFT LOWER EXTREMITY. WEAR KNEE IMMOBILIZER AT ALL TIMES.   KEEP DRESSING DRY, CLEAN AND INTACT. ICE PACKS DAILY OR AS NEEDED FOR PAIN AND SWELLING ANKLE PUMPS 3 TIMES DAILY BILATERAL

## 2014-10-25 NOTE — Anesthesia Procedure Notes (Signed)
Anesthesia Regional Block:  Femoral nerve block  Pre-Anesthetic Checklist: ,, timeout performed, Correct Patient, Correct Site, Correct Laterality, Correct Procedure, Correct Position, site marked, Risks and benefits discussed,  Surgical consent,  Pre-op evaluation,  At surgeon's request and post-op pain management  Laterality: Left and Lower  Prep: chloraprep       Needles:  Injection technique: Single-shot  Needle Type: Echogenic Stimulator Needle     Needle Length: 4cm 4 cm Needle Gauge: 22 and 22 G    Additional Needles:  Procedures: nerve stimulator Femoral nerve block  Nerve Stimulator or Paresthesia:  Response: patella twitch, 0.45 mA, 0.1 ms,   Additional Responses:   Narrative:  Start time: 10/25/2014 12:02 PM End time: 10/25/2014 12:07 PM Injection made incrementally with aspirations every 5 mL.  Performed by: Personally  Anesthesiologist: Jenita Seashore, MD  Additional Notes: Pt identified in Holding room.  Monitors applied. Working IV access confirmed. Sterile prep L groin.  #22ga PNS to patella twitch at 0.77mA threshold.  30cc 0.5% Bupivacaine with 1:200k epi injected incrementally after negative test dose.  Patient asymptomatic, VSS, no heme aspirated, tolerated well.  Jenita Seashore, MD

## 2014-10-25 NOTE — Transfer of Care (Signed)
Immediate Anesthesia Transfer of Care Note  Patient: Lisa Lucero  Procedure(s) Performed: Procedure(s): OPEN REDUCTION INTERNAL (ORIF) FIXATION LEFT PATELLA (Left)  Patient Location: PACU  Anesthesia Type:General  Level of Consciousness: awake, alert , oriented and patient cooperative  Airway & Oxygen Therapy: Patient Spontanous Breathing and Patient connected to nasal cannula oxygen  Post-op Assessment: Report given to PACU RN, Post -op Vital signs reviewed and stable and Patient moving all extremities  Post vital signs: Reviewed and stable  Complications: No apparent anesthesia complications

## 2014-10-25 NOTE — Telephone Encounter (Signed)
Spoke with pt who states unable to keep her appt today as she is being admitted to hosp for surg on left knee. Fell on Thursday and was instructed to hold coumadin and given Vitamin K 5mg  tablet with instructions to take daily for 3 days . Pt instructed to let us know when discharged from hospital and appt for today cancelled

## 2014-10-25 NOTE — Brief Op Note (Cosign Needed)
10/25/2014  2:39 PM  PATIENT:  Lisa Lucero  79 y.o. female  PRE-OPERATIVE DIAGNOSIS:  Left Patella Fracture  POST-OPERATIVE DIAGNOSIS:  Left Patella Fracture  PROCEDURE:  Procedure(s): OPEN REDUCTION INTERNAL (ORIF) FIXATION LEFT PATELLA (Left)  SURGEON:  Surgeon(s) and Role:    * Marybelle Killings, MD - Primary  PHYSICIAN ASSISTANT: Phillips Hay PAC  ASSISTANTS: none   ANESTHESIA:   general  EBL:  Total I/O In: 28 [Blood:335] Out: -   BLOOD ADMINISTERED:1 unit CC PRBC  DRAINS: none   LOCAL MEDICATIONS USED:  NONE  SPECIMEN:  No Specimen  DISPOSITION OF SPECIMEN:  N/A  COUNTS:  YES  TOURNIQUET:   Total Tourniquet Time Documented: Thigh (Left) - 42 minutes Total: Thigh (Left) - 42 minutes   DICTATION: .Note written in EPIC  PLAN OF CARE: Admit to inpatient   PATIENT DISPOSITION:  PACU - hemodynamically stable.   Delay start of Pharmacological VTE agent (>24hrs) due to surgical blood loss or risk of bleeding: no

## 2014-10-25 NOTE — Anesthesia Preprocedure Evaluation (Addendum)
Anesthesia Evaluation  Patient identified by MRN, date of birth, ID band Patient awake    Reviewed: Allergy & Precautions, H&P , NPO status , Patient's Chart, lab work & pertinent test results  History of Anesthesia Complications Negative for: history of anesthetic complications  Airway Mallampati: I  TM Distance: >3 FB Neck ROM: Full    Dental  (+) Teeth Intact, Dental Advisory Given   Pulmonary COPDformer smoker (quit '11),  breath sounds clear to auscultation        Cardiovascular hypertension, Pt. on medications and Pt. on home beta blockers - angina+ dysrhythmias (cardizem, metoprolol, coumadin) Atrial Fibrillation Rhythm:Irregular Rate:Normal  '11 ECHO: EF 55-60%, valves OK '10 stress test: normal   Neuro/Psych CVA, No Residual Symptoms    GI/Hepatic Neg liver ROS, GERD-  Medicated and Controlled,  Endo/Other  Hypothyroidism   Renal/GU negative Renal ROS     Musculoskeletal  (+) Arthritis -,   Abdominal   Peds  Hematology  (+) Blood dyscrasia (Hb 7.6), anemia ,   Anesthesia Other Findings   Reproductive/Obstetrics                        Anesthesia Physical Anesthesia Plan  ASA: III  Anesthesia Plan: General   Post-op Pain Management:    Induction: Intravenous  Airway Management Planned: Oral ETT  Additional Equipment:   Intra-op Plan:   Post-operative Plan: Extubation in OR  Informed Consent: I have reviewed the patients History and Physical, chart, labs and discussed the procedure including the risks, benefits and alternatives for the proposed anesthesia with the patient or authorized representative who has indicated his/her understanding and acceptance.   Dental advisory given  Plan Discussed with: CRNA and Surgeon  Anesthesia Plan Comments: (Plan routine monitors, GETA with femoral nerve block for post op analgesia)        Anesthesia Quick Evaluation

## 2014-10-25 NOTE — Progress Notes (Signed)
ANTICOAGULATION CONSULT NOTE - Initial Consult  Pharmacy Consult for Coumadin Indication: PAF  Allergies  Allergen Reactions  . Celebrex [Celecoxib] Diarrhea  . Lipitor [Atorvastatin Calcium] Other (See Comments)    myalgias    Patient Measurements: Height: 5\' 6"  (167.6 cm) Weight: 125 lb (56.7 kg) IBW/kg (Calculated) : 59.3  Vital Signs: Temp: 99 F (37.2 C) (10/26 1839) Temp Source: Oral (10/26 0951) BP: 144/65 mmHg (10/26 1839) Pulse Rate: 85 (10/26 1839)  Labs:  Recent Labs  10/25/14 0957  HGB 7.6*  HCT 25.0*  PLT 381  APTT 34  LABPROT 14.4  INR 1.11  CREATININE 0.89    Estimated Creatinine Clearance: 47.4 ml/min (by C-G formula based on Cr of 0.89).   Medical History: Past Medical History  Diagnosis Date  . Paroxysmal atrial fibrillation   . History of embolic stroke 02/2548  . Erosive gastritis     with GI bleed thought due to elevated INR and duodenitis  . Hyperthyroidism   . Peripheral edema   . Long-term (current) use of anticoagulants   . HTN (hypertension)   . Stroke   . GERD (gastroesophageal reflux disease)   . Back pain   . COPD (chronic obstructive pulmonary disease)     pt denies on 09/27/14  . H/O hypercholesterolemia     PMH: only  . Arthritis   . Anemia     Iron deficiency anemia  . Hypothyroidism     Assessment: 79 year old female on Coumadin PTA for PAF Now s/p patella fracture repair Last Coumadin dose 10/21 Dose PTA  = 5 mg on Thursday and Sunday, 2.5 mg other days  Goal of Therapy:  INR 2-3 Monitor platelets by anticoagulation protocol: Yes   Plan:  1) Coumadin 5 mg po x 1 dose tonight 2) Daily INR  Thank you. Anette Guarneri, PharmD [phone]  Tad Moore 10/25/2014,6:45 PM

## 2014-10-25 NOTE — Op Note (Signed)
preop diagnosis: left displaced transverse patellar fracture  Postop diagnosis: Same  Procedure: Open reduction internal fixation left transverse patella fracture  Surgeon: Its M.D.  Assistant: Phillips Hay PA-C medically necessary and present for the entire procedure  Tourniquet time less than 40 minutes 350  Anesthesia Gen. plus preoperative block  Complications: None  Implants Zimmer stainless steel 4.0 cannulated screws 45 and 42 mm 18-gauge stainless steel wire, tension band wiring technique  Procedure: After induction general anesthesia Ancef prophylaxis to prepping and draping patient had a seroma from previous lumbar laminectomy this may be related to the Coumadin she is on. She had extensive ecchymosis in her calf from the patella fracture and ongoing anticoagulant therapy for her atrial fibrillation.  Prepping and draping was performed with DuraPrep. Usual extremity sheets and drapes stockinette was applied. Timeout procedure was completed. Sterile skin marker was used leg was wrapped with Esmarch tourniquet was inflated. Midline incision was made superficial retinaculum was divided. Fracture was displaced irrigated and curetted and hematoma was removed from the knee. Fracture was reduced with K wires held with large tenaculum and cannulated screws were placed using K wire checking under fluoroscopy checking reduction squeezing more tightly and then placement of 2 cannulated screws 42 and 45 mm which tightened on the fracture anatomically. 18-gauge wire was then passed in figure-of-eight fashion through the cannulated screws tightened down securely which compressed the fracture. Spot fluoroscopy pictures were taken documenting reduction and closure the fracture. Copious irrigation anterior cortex was anatomic. There was not significant comminution of fracture. Subtendinous tissue reapproximated with 2-0 Vicryl. Subcuticular skin closure. Postop dressing with knee immobilizer is  applied patient tolerated the procedure well and was transferred to the care room in stable condition. Preoperatively her hemoglobin was less than 8. She been placed on iron and chronic Coumadin therapy may have caused some subacute GI bleeding. Patient had been noted to have a hemoglobin just above a before lumbar procedure couple months ago. Patient will be restarted on her Coumadin.

## 2014-10-25 NOTE — H&P (Signed)
Lisa Lucero is an 79 y.o. female.   Chief Complaint: left patella fracture HPI: Pt fell at home in the bathroom.  Was seen in office by DR Roda Shutters.  Radiographs with displaced left patella fracture.  Pt admitted for ORIF of patella.   Coumadin stopped on 10/20/2014.    Past Medical History  Diagnosis Date  . Paroxysmal atrial fibrillation   . History of embolic stroke 06/2010  . Erosive gastritis     with GI bleed thought due to elevated INR and duodenitis  . Hyperthyroidism   . Peripheral edema   . Long-term (current) use of anticoagulants   . HTN (hypertension)   . Stroke   . GERD (gastroesophageal reflux disease)   . Back pain   . COPD (chronic obstructive pulmonary disease)     pt denies on 09/27/14  . H/O hypercholesterolemia     PMH: only  . Arthritis   . Anemia     Iron deficiency anemia  . Hypothyroidism     Past Surgical History  Procedure Laterality Date  . Hemorrhoid surgery    . Operative hysteroscopy    . Abdominal hysterectomy    . Cholecystectomy  03/11/2012    Procedure: LAPAROSCOPIC CHOLECYSTECTOMY WITH INTRAOPERATIVE CHOLANGIOGRAM;  Surgeon: Clovis Pu. Cornett, MD;  Location: [facility] OR;  Service: General;  Laterality: N/A;  . Breast enhancement surgery    . Colonoscopy    . Wisdom tooth extraction    . Lumbar laminectomy N/A 09/29/2014    Procedure: L4-5 Decompression, Hemi-Laminectomy,  Removal Free Fragment;  Surgeon: Eldred Manges, MD;  Location[facility] OR;  Service: Orthopedics;  Laterality: N/A;    Family History  Problem Relation Age of Onset  . Stroke Mother   . Dementia Father   . Hypertension    . Arthritis    . Cancer Brother     lung   Social History:  reports that she quit smoking about 4 years ago. Her smoking use included Cigarettes. She smoked 0.00 packs per day. She has never used smokeless tobacco. She reports that she does not drink alcohol or use illicit drugs.  Allergies:  Allergies  Allergen Reactions  . Celebrex [Celecoxib] Diarrhea  .  Lipitor [Atorvastatin Calcium] Other (See Comments)    myalgias    Medications Prior to Admission  Medication Sig Dispense Refill  . diltiazem (CARDIZEM LA) 240 MG 24 hr tablet Take 240 mg by mouth daily.      . ferrous sulfate 325 (65 FE) MG tablet Take 325 mg by mouth daily.      . hydrochlorothiazide (HYDRODIURIL) 25 MG tablet Take 12.5 mg by mouth daily.       . hydrocortisone (ANUSOL-HC) 25 MG suppository Place 25 mg rectally 2 (two) times daily as needed for hemorrhoids.       Marland Kitchen ketotifen (THERA TEARS ALLERGY) 0.025 % ophthalmic solution Place 1 drop into both eyes 2 (two) times daily as needed (for dry eyes).      Marland Kitchen levothyroxine (SYNTHROID, LEVOTHROID) 25 MCG tablet Take 25 mcg by mouth daily before breakfast.      . lisinopril (PRINIVIL,ZESTRIL) 20 MG tablet Take 20 mg by mouth 2 (two) times daily.       . metoprolol (LOPRESSOR) 50 MG tablet Take 50 mg by mouth 2 (two) times daily.        . Multiple Vitamin (MULITIVITAMIN WITH MINERALS) TABS Take 1 tablet by mouth daily.      Marland Kitchen oxyCODONE-acetaminophen (PERCOCET/ROXICET) 5-325 MG per  tablet Take 1 tablet by mouth every 6 (six) hours as needed for severe pain.       . pantoprazole (PROTONIX) 40 MG tablet Take 40 mg by mouth daily.       . potassium chloride (K-DUR,KLOR-CON) 10 MEQ tablet Take 1 tablet (10 mEq total) by mouth 2 (two) times daily.  60 tablet  6  . warfarin (COUMADIN) 5 MG tablet Take 2.5-5 mg by mouth daily. $RemoveBefo'5mg'fjIwRFaaHDW$  on thu, and sun, 2.5 mg all other days      . zolpidem (AMBIEN CR) 6.25 MG CR tablet Take 6.25 mg by mouth at bedtime.       . nitroGLYCERIN (NITROSTAT) 0.4 MG SL tablet Place 1 tablet (0.4 mg total) under the tongue every 5 (five) minutes as needed for chest pain.  25 tablet  PRN    Results for orders placed during the hospital encounter of 10/25/14 (from the past 48 hour(s))  CBC     Status: Abnormal   Collection Time    10/25/14  9:57 AM      Result Value Ref Range   WBC 4.2  4.0 - 10.5 K/uL   RBC 3.42  (*) 3.87 - 5.11 MIL/uL   Hemoglobin 7.6 (*) 12.0 - 15.0 g/dL   HCT 25.0 (*) 36.0 - 46.0 %   MCV 73.1 (*) 78.0 - 100.0 fL   MCH 22.2 (*) 26.0 - 34.0 pg   MCHC 30.4  30.0 - 36.0 g/dL   RDW 19.9 (*) 11.5 - 15.5 %   Platelets 381  150 - 400 K/uL  COMPREHENSIVE METABOLIC PANEL     Status: Abnormal   Collection Time    10/25/14  9:57 AM      Result Value Ref Range   Sodium 133 (*) 137 - 147 mEq/L   Potassium 3.6 (*) 3.7 - 5.3 mEq/L   Chloride 96  96 - 112 mEq/L   CO2 22  19 - 32 mEq/L   Glucose, Bld 99  70 - 99 mg/dL   BUN 18  6 - 23 mg/dL   Creatinine, Ser 0.89  0.50 - 1.10 mg/dL   Calcium 9.7  8.4 - 10.5 mg/dL   Total Protein 6.4  6.0 - 8.3 g/dL   Albumin 3.7  3.5 - 5.2 g/dL   AST 20  0 - 37 U/L   ALT 11  0 - 35 U/L   Alkaline Phosphatase 50  39 - 117 U/L   Total Bilirubin 0.9  0.3 - 1.2 mg/dL   GFR calc non Af Amer 61 (*) >90 mL/min   GFR calc Af Amer 71 (*) >90 mL/min   Comment: (NOTE)     The eGFR has been calculated using the CKD EPI equation.     This calculation has not been validated in all clinical situations.     eGFR's persistently <90 mL/min signify possible Chronic Kidney     Disease.   Anion gap 15  5 - 15  PROTIME-INR     Status: None   Collection Time    10/25/14  9:57 AM      Result Value Ref Range   Prothrombin Time 14.4  11.6 - 15.2 seconds   INR 1.11  0.00 - 1.49  APTT     Status: None   Collection Time    10/25/14  9:57 AM      Result Value Ref Range   aPTT 34  24 - 37 seconds  TYPE AND SCREEN  Status: None   Collection Time    10/25/14 10:00 AM      Result Value Ref Range   ABO/RH(D) O POS     Antibody Screen NEG     Sample Expiration 10/28/2014     No results found.  Review of Systems  Musculoskeletal:       Left knee pain    Blood pressure 174/84, pulse 65, temperature 98.1 F (36.7 C), temperature source Oral, height $RemoveBefo'5\' 6"'VfrTPhxQDkw$  (1.676 m), weight 56.7 kg (125 lb), SpO2 100.00%. Physical Exam  Constitutional: She is oriented to person,  place, and time. She appears well-developed and well-nourished.  HENT:  Head: Normocephalic and atraumatic.  Eyes: EOM are normal. Pupils are equal, round, and reactive to light.  Ecchymosis surrounding the left eye which is resolving  Neck: Normal range of motion.  Cardiovascular: Normal rate and regular rhythm.   Respiratory: Effort normal and breath sounds normal.  GI: Soft.  Musculoskeletal:  Ecchymosis left knee.  Distal pulse and sensation intact.  Ankle DF and PF intact.  Neurological: She is alert and oriented to person, place, and time.  Skin: Skin is warm and dry.     Assessment/Plan Displaced left patella fracture  PLAN:  ORIF of left patella fracture.  Donda Friedli M 10/25/2014, 11:59 AM

## 2014-10-25 NOTE — Progress Notes (Signed)
Orthopedic Tech Progress Note Patient Details:  Lisa Lucero [birth year] [id number] Patient has knee immobilizer. Patient ID: [name], female   DOB: 28-Feb-1935, 79 y.o.   MRN: [id number]   Braulio Bosch 10/25/2014, 9:37 PM

## 2014-10-25 NOTE — Anesthesia Postprocedure Evaluation (Signed)
  Anesthesia Post-op Note  Patient: Lisa Lucero  Procedure(s) Performed: Procedure(s): OPEN REDUCTION INTERNAL (ORIF) FIXATION LEFT PATELLA (Left)  Patient Location: PACU  Anesthesia Type:GA combined with regional for post-op pain  Level of Consciousness: awake, alert , oriented and patient cooperative  Airway and Oxygen Therapy: Patient Spontanous Breathing and Patient connected to nasal cannula oxygen  Post-op Pain: none  Post-op Assessment: Post-op Vital signs reviewed, Patient's Cardiovascular Status Stable, Respiratory Function Stable, Patent Airway, No signs of Nausea or vomiting and Pain level controlled  Post-op Vital Signs: Reviewed and stable  Last Vitals:  Filed Vitals:   10/25/14 1500  BP: 181/88  Pulse: 57  Temp:   Resp: 17    Complications: No apparent anesthesia complications

## 2014-10-26 LAB — TYPE AND SCREEN
ABO/RH(D): O POS
Antibody Screen: NEGATIVE
UNIT DIVISION: 0
Unit division: 0

## 2014-10-26 LAB — PROTIME-INR
INR: 1.03 (ref 0.00–1.49)
Prothrombin Time: 13.7 seconds (ref 11.6–15.2)

## 2014-10-26 LAB — HEMOGLOBIN AND HEMATOCRIT, BLOOD
HCT: 30.1 % — ABNORMAL LOW (ref 36.0–46.0)
HEMOGLOBIN: 9.9 g/dL — AB (ref 12.0–15.0)

## 2014-10-26 MED ORDER — WARFARIN SODIUM 4 MG PO TABS
4.0000 mg | ORAL_TABLET | Freq: Once | ORAL | Status: AC
  Administered 2014-10-26: 4 mg via ORAL
  Filled 2014-10-26: qty 1

## 2014-10-26 NOTE — Progress Notes (Signed)
CARE MANAGEMENT NOTE 10/26/2014  Patient:  Lisa Lucero, Lisa Lucero   Account Number:  [id number]  Date Initiated:  10/26/2014  Documentation initiated by:  Olga Coaster  Subjective/Objective Assessment:   ADMITTED FOR SURGERY     Action/Plan:   CM FOLLOWING FOR DCP   Anticipated DC Date:  10/28/2014   Anticipated DC Plan:  AWAITING FOR PT/OT EVAL FOR DISCHARGE NEEDS WITH ATTENDING MD APPROVAL     DC Planning Services  CM consult       Status of service:  In process, will continue to follow Medicare Important Message given?   (If response is "NO", the following Medicare IM given date fields will be blank)  Per UR Regulation:  Reviewed for med. necessity/level of care/duration of stay  Comments:  10/27/2015Mindi Slicker RN,BSN,MHA [phone]

## 2014-10-26 NOTE — Evaluation (Signed)
Physical Therapy Evaluation Patient Details Name: Lisa Lucero MRN: [id number] DOB: 05-Apr-1935 Today's Date: 10/26/2014   History of Present Illness  79 y/o WF admitted forORIF of L patella after falling in bathroom last week.  Pt also reports falling on the stairs 2 weeks ago.  Clinical Impression  Pt admitted with patella fracture and is s/p L patella ORIF (10/26). Pt currently with functional limitations due to the deficits listed below (see PT Problem List).  Pt will benefit from skilled PT to increase their independence and safety with mobility to allow discharge to the venue listed below. Recommend HHPT and w/c with elevating leg rests for community distance ambulation due to TDWB status.  Pt has had 2 falls in the last several weeks.  Pt reports that her bedroom is upstairs, but they have a full bedroom/bathroom downstairs.  Education provided on home safety and recommendation to use downstairs bedroom.  Also educated on use of KI and no ROM at this time, as well as WB status.     Follow Up Recommendations Home health PT    Equipment Recommendations  Wheelchair (measurements PT);Wheelchair cushion (measurements PT) (standard w/c with ELEVATING leg rests)    Recommendations for Other Services       Precautions / Restrictions Precautions Precautions: Fall Required Braces or Orthoses: Knee Immobilizer - Left Knee Immobilizer - Left: On at all times Restrictions Weight Bearing Restrictions: Yes LLE Weight Bearing: Touchdown weight bearing      Mobility  Bed Mobility Overal bed mobility: Needs Assistance Bed Mobility: Supine to Sit     Supine to sit: Min guard     General bed mobility comments: educated on how to use R LE to A L LE  Transfers Overall transfer level: Needs assistance Equipment used: Rolling walker (2 wheeled) Transfers: Sit to/from Stand Sit to Stand: Min guard         General transfer comment: cueing for hand and leg  placement  Ambulation/Gait Ambulation/Gait assistance: Min guard Ambulation Distance (Feet): 25 Feet Assistive device: Rolling walker (2 wheeled) Gait Pattern/deviations: Step-to pattern     General Gait Details: Pt needed cueing 50% of the time to stay within RW.  Good TDWB status.  Stairs            Wheelchair Mobility    Modified Rankin (Stroke Patients Only)       Balance Overall balance assessment: Needs assistance Sitting-balance support: Feet supported Sitting balance-Leahy Scale: Good     Standing balance support: Bilateral upper extremity supported Standing balance-Leahy Scale: Poor                               Pertinent Vitals/Pain Pain Assessment: 0-10 Pain Score: 4  Pain Descriptors / Indicators: Burning    Home Living Family/patient expects to be discharged to:: Private residence Living Arrangements: Spouse/significant other Available Help at Discharge: Family;Available 24 hours/day Type of Home: House Home Access: Stairs to enter   [employer] of Steps: 1 platform then another step Home Layout: Two level;Able to live on main level with bedroom/bathroom Home Equipment: Gilford Rile - 2 wheels;Cane - single point;Bedside commode;Wheelchair - manual Additional Comments: borrowing w/c from church that has standard foot rests but no elevating    Prior Function Level of Independence: Independent with assistive device(s)         Comments: Amb with cane until she hurt knee and has been using RW since then.  Hand Dominance        Extremity/Trunk Assessment   Upper Extremity Assessment: Defer to OT evaluation           Lower Extremity Assessment: LLE deficits/detail;Overall WFL for tasks assessed   LLE Deficits / Details: KI intact  Cervical / Trunk Assessment: Normal  Communication   Communication: No difficulties  Cognition Arousal/Alertness: Awake/alert Behavior During Therapy: WFL for tasks  assessed/performed Overall Cognitive Status: Within Functional Limits for tasks assessed       Memory: Decreased short-term memory (possibly due to meds)              General Comments General comments (skin integrity, edema, etc.): husband present.  Discussed KI, DME, etc.    Exercises        Assessment/Plan    PT Assessment Patient needs continued PT services  PT Diagnosis Difficulty walking   PT Problem List Decreased activity tolerance;Decreased balance;Decreased mobility;Decreased knowledge of use of DME  PT Treatment Interventions DME instruction;Gait training;Functional mobility training;Therapeutic activities;Therapeutic exercise;Stair training   PT Goals (Current goals can be found in the Care Plan section) Acute Rehab PT Goals Patient Stated Goal: go home PT Goal Formulation: With patient/family Time For Goal Achievement: 11/09/14 Potential to Achieve Goals: Good    Frequency Min 5X/week   Barriers to discharge        Co-evaluation               End of Session Equipment Utilized During Treatment: Gait belt;Left knee immobilizer Activity Tolerance: Patient tolerated treatment well Patient left: in chair;with family/visitor present;with call bell/phone within reach Nurse Communication: Mobility status;Weight bearing status         Time: 1123-1214 PT Time Calculation (min): 51 min   Charges:   PT Evaluation $Initial PT Evaluation Tier I: 1 Procedure PT Treatments $Gait Training: 8-22 mins $Therapeutic Activity: 23-37 mins   PT G Codes:          Chrystian Ressler LUBECK 10/26/2014, 12:46 PM

## 2014-10-26 NOTE — Progress Notes (Signed)
Subjective: 1 Day Post-Op Procedure(s) (LRB): OPEN REDUCTION INTERNAL (ORIF) FIXATION LEFT PATELLA (Left) Patient reports pain as 3 on 0-10 scale.    Objective: Vital signs in last 24 hours: Temp:  [97.4 F (36.3 C)-99 F (37.2 C)] 98.8 F (37.1 C) (10/27 0527) Pulse Rate:  [54-96] 55 (10/27 0527) Resp:  [8-22] 18 (10/27 0527) BP: (131-189)/(55-98) 165/90 mmHg (10/27 0527) SpO2:  [97 %-100 %] 97 % (10/27 0527) Weight:  [56.7 kg (125 lb)] 56.7 kg (125 lb) (10/26 0951)  Intake/Output from previous day: 10/26 0701 - 10/27 0700 In: 1170 [I.V.:500; Blood:670] Out: -  Intake/Output this shift:     Recent Labs  10/25/14 0957 10/26/14 0520  HGB 7.6* 9.9*    Recent Labs  10/25/14 0957 10/26/14 0520  WBC 4.2  --   RBC 3.42*  --   HCT 25.0* 30.1*  PLT 381  --     Recent Labs  10/25/14 0957  NA 133*  K 3.6*  CL 96  CO2 22  BUN 18  CREATININE 0.89  GLUCOSE 99  CALCIUM 9.7    Recent Labs  10/25/14 0957 10/26/14 0520  INR 1.11 1.03    Neurologically intact   Can contract quad , block wearing off. Pain mild.   Assessment/Plan: 1 Day Post-Op Procedure(s) (LRB): OPEN REDUCTION INTERNAL (ORIF) FIXATION LEFT PATELLA (Left) Up with therapy   TDWB , use walker, plan no knee motion until next week due to blood thinner and skin healing concerns over patella. Will start knee motion next week . She will be going home with husband has downstairs bedroom.    Aysha Livecchi C 10/26/2014, 7:34 AM

## 2014-10-26 NOTE — Clinical Social Work Psychosocial (Signed)
Clinical Social Work Department BRIEF PSYCHOSOCIAL ASSESSMENT 10/26/2014  Patient:  Lisa Lucero, Lisa Lucero     Account Number:  [id number]     Admit date:  10/25/2014  Clinical Social Worker:  Glendon Axe, CLINICAL SOCIAL WORKER  Date/Time:  10/26/2014 10:00 AM  Referred by:  Physician  Date Referred:  10/26/2014 Referred for  SNF Placement   Other Referral:   Interview type:  Other - See comment Other interview type:   CSW interviewed patient and patient's husband, Lanny at bedside.    PSYCHOSOCIAL DATA Living Status:  HUSBAND Admitted from facility:   Level of care:   Primary support name:  Treanna Dumler [phone] Primary support relationship to patient:  SPOUSE Degree of support available:   Strong    CURRENT CONCERNS Current Concerns  Post-Acute Placement   Other Concerns:    SOCIAL WORK ASSESSMENT / PLAN Clinical Social Worker met with pt and pt's husband in reference to post-acute placement. CSW explained SNF process and reviewed SNF list. Pt reported she was familiar with SNF, [facility]. Pt and pt's husband made inquiry of Home Health option and CSW briefly explained Home Health services. Pt and pt's husband reported that pt will have support from family at home and would like to return home with Home Health. RNCM notified of pt's request. CSW will sign off for now, if new needs arise please consult CSW again.   Assessment/plan status:  No Further Intervention Required Other assessment/ plan:   Information/referral to community resources:   SNF information provided.    PATIENT'S/FAMILY'S RESPONSE TO PLAN OF CARE: Pt lying in bed, alert and oriented with husband at bedside. Pt and pt's husband reported pt will have assistance at home and are requesting Home Health services. Pt and pt's husband declined SNF placement. RNCM notified.    Glendon Axe, MSW, LCSWA [phone] 10/26/2014 12:26 PM

## 2014-10-26 NOTE — Progress Notes (Signed)
ANTICOAGULATION CONSULT NOTE - Follow Up Consult  Pharmacy Consult:  Coumadin Indication: atrial fibrillation and VTE prophylaxis s/p patella fracture repair  Allergies  Allergen Reactions  . Celebrex [Celecoxib] Diarrhea  . Lipitor [Atorvastatin Calcium] Other (See Comments)    myalgias    Patient Measurements: Height: 5\' 6"  (167.6 cm) Weight: 125 lb (56.7 kg) IBW/kg (Calculated) : 59.3  Vital Signs: Temp: 98.2 F (36.8 C) (10/27 1008) Temp Source: Oral (10/27 1008) BP: 155/76 mmHg (10/27 1008) Pulse Rate: 74 (10/27 1008)  Labs:  Recent Labs  10/25/14 0957 10/26/14 0520  HGB 7.6* 9.9*  HCT 25.0* 30.1*  PLT 381  --   APTT 34  --   LABPROT 14.4 13.7  INR 1.11 1.03  CREATININE 0.89  --     Estimated Creatinine Clearance: 47.4 ml/min (by C-G formula based on Cr of 0.89).    Assessment: 50 YOF on Coumadin PTA for history of Afib.  Coumadin now resumed s/p patella fracture repair.  INR is sub-therapeutic as expected.  No bleeding reported.   Goal of Therapy:  INR 2-3    Plan:  - Coumadin 4mg  PO today - Daily PT / INR - Consider changing IVF to NS d/t hyponatremia    Princesa Willig D. Mina Marble, PharmD, BCPS Pager:  [phone] 10/26/2014, 11:41 AM

## 2014-10-27 LAB — PROTIME-INR
INR: 1.11 (ref 0.00–1.49)
Prothrombin Time: 14.4 seconds (ref 11.6–15.2)

## 2014-10-27 MED ORDER — WARFARIN SODIUM 5 MG PO TABS: 5.0000 mg | ORAL_TABLET | Freq: Once | ORAL | Status: DC

## 2014-10-27 NOTE — Progress Notes (Signed)
Talked to patient about [facility]/ [facility] choices, pt requested [facility]; Attending MD at discharge please order HHPT/ OT, shower seat at discharge; patient stated that she has a wheelchair, walker and cane at home; [place] RN,BSN,MHA [phone]

## 2014-10-27 NOTE — Progress Notes (Signed)
10/27/14 1400  OT Time Calculation  OT Start Time 1407  OT Stop Time 1435  OT Time Calculation (min) 28 min  OT General Charges  $OT Visit 1 Procedure  OT Evaluation  $Initial OT Evaluation Tier I 1 Procedure  OT Treatments  $Self Care/Home Management  8-22 mins   I agree with the following treatment note after reviewing documentation.   Jeri Modena OTR/L Pager: [phone] Office: [phone] .

## 2014-10-27 NOTE — Progress Notes (Signed)
D/C orders received. Pt educated on d/c instructions. Verbalized understanding. Pt handed d/c packet and prescription. IV removed. Pt taken downstairs by staff via wheelchair.

## 2014-10-27 NOTE — Progress Notes (Signed)
Occupational Therapy Evaluation Patient Details Name: Lisa Lucero MRN: [id number] DOB: 1935/07/31 Today's Date: 10/27/2014    History of Present Illness 79 y.o. female admitted for ORIF of L patella after falling in bathroom last week.  Pt also reports falling on the stairs 2 weeks ago.   Clinical Impression   Ptt is s/p L patella ORIF surgery resulting in functional limitations due to the deficits listed below (see OT problem list). Pt demonstrating good progress with mobility and use of compensatory techniques for ADLs. Pt is at a Supervision-min guard assist level for bed mobility and ambulation. Pt required min (A) with toilet transfer to keep LLE elevated and straight. Discussed options to assist with this at home with help from family. Pt's husband also expressed wanting to set up [facility] OT/PT. Pt will benefit from skilled OT acutely to increase independence and safety with ADLs to allow discharge home with [facility] OT/PT.    Follow Up Recommendations  Home health OT;Supervision/Assistance - 24 hour    Equipment Recommendations  Tub/shower seat (Pt currently using step stool in shower)    Recommendations for Other Services       Precautions / Restrictions Precautions Precautions: Fall Required Braces or Orthoses: Knee Immobilizer - Left Knee Immobilizer - Left: On at all times Restrictions Weight Bearing Restrictions: Yes LLE Weight Bearing: Touchdown weight bearing      Mobility Bed Mobility Overal bed mobility: Modified Independent Bed Mobility: Supine to Sit     Supine to sit: Supervision     General bed mobility comments: Pt demonstrating technique of using RLE to assist LLE to the floor without verbal or tactile cues. One verbal cue to scoot R hip closer to EOB  Transfers Overall transfer level: Needs assistance Equipment used: Rolling walker (2 wheeled) Transfers: Sit to/from Stand Sit to Stand: Supervision         General transfer comment: Pt demonstrating  proper hand placement and safe technique. Supervision for safety and balance due to TDWB status of LLE    Balance Overall balance assessment: Needs assistance Sitting-balance support: No upper extremity supported;Feet supported Sitting balance-Leahy Scale: Good     Standing balance support: Bilateral upper extremity supported;During functional activity Standing balance-Leahy Scale: Poor Standing balance comment: Pt relying on RW for balance during sit/stand transfers and static standing tasks. Pt leaning on sink counter with bil UE to maintain balance while washing hands.                            ADL Overall ADL's : Needs assistance/impaired     Grooming: Wash/dry hands;Min guard;Standing Grooming Details (indicate cue type and reason): Pt preferred leaning against sink counter to maintain balance instead of RW                 Toilet Transfer: Minimal assistance;Ambulation;BSC;RW;Cueing for safety Toilet Transfer Details (indicate cue type and reason): Min (A) and verbal cues to keep LLE elevated and knee straight while descending to toilet. Discussed options to help keep knee elevated while using commode. Pt suggested a stool, therapist educated pt on fall risk of stool if pt was alone and had no one to remove the stool upon standing  Toileting- Clothing Manipulation and Hygiene: Maximal assistance;Sit to/from stand Toileting - Clothing Manipulation Details (indicate cue type and reason): Max (A) for clothing manipulation due to pt needing bil UE support to descend to toilet while keeping LLE elevated     Functional mobility  during ADLs: Min guard;Rolling walker General ADL Comments: Pt demonstrating proper step sequence with TDWB status and RW during ambulation. Pt demonstrating good safety awareness and reports she is overly cautious about everything because she does not want to fall again.       Vision                     Perception     Praxis       Pertinent Vitals/Pain Pain Assessment: 0-10 Pain Score: 4  Pain Location: L knee Pain Intervention(s): Monitored during session;Repositioned     Hand Dominance Right   Extremity/Trunk Assessment Upper Extremity Assessment Upper Extremity Assessment: Overall WFL for tasks assessed   Lower Extremity Assessment Lower Extremity Assessment: LLE deficits/detail;Overall WFL for tasks assessed LLE Deficits / Details: KI intact LLE: Unable to fully assess due to immobilization   Cervical / Trunk Assessment Cervical / Trunk Assessment: Normal   Communication Communication Communication: No difficulties   Cognition Arousal/Alertness: Awake/alert Behavior During Therapy: WFL for tasks assessed/performed Overall Cognitive Status: Within Functional Limits for tasks assessed                     General Comments       Exercises       Shoulder Instructions      Home Living Family/patient expects to be discharged to:: Private residence Living Arrangements: Spouse/significant other Available Help at Discharge: Family;Available 24 hours/day Type of Home: House Home Access: Stairs to enter [employer] of Steps: 2 steps, 1 landing Entrance Stairs-Rails: None Home Layout: Two level;Able to live on main level with bedroom/bathroom     Bathroom Shower/Tub: Walk-in shower;Door   Bathroom Toilet: Handicapped height     Home Equipment: Environmental consultant - 2 wheels;Cane - single point;Wheelchair - manual;Grab bars - toilet;Hand held shower head;Bedside commode   Additional Comments: Pt reports using step stool in shower to hold onto. Pt borrowing w/c from church that has standard foot rests but no elevating      Prior Functioning/Environment Level of Independence: Independent with assistive device(s)        Comments: Amb with cane until she hurt knee and has been using RW since then.    OT Diagnosis: Generalized weakness;Acute pain   OT Problem List: Decreased  strength;Decreased activity tolerance;Impaired balance (sitting and/or standing);Decreased safety awareness;Decreased knowledge of use of DME or AE;Pain   OT Treatment/Interventions: Self-care/ADL training;DME and/or AE instruction;Therapeutic activities;Patient/family education;Balance training    OT Goals(Current goals can be found in the care plan section) Acute Rehab OT Goals Patient Stated Goal: go home OT Goal Formulation: With patient Time For Goal Achievement: 11/10/14 Potential to Achieve Goals: Good ADL Goals Pt Will Perform Lower Body Bathing: with min assist;with adaptive equipment;sitting/lateral leans Pt Will Perform Lower Body Dressing: with min assist;sitting/lateral leans Pt Will Transfer to Toilet: with supervision;ambulating;bedside commode;grab bars (while keeping LLE straight and elevated) Pt Will Perform Toileting - Clothing Manipulation and hygiene: with min assist;sitting/lateral leans (while keeping LLE straight and elevated) Pt Will Perform Tub/Shower Transfer: Shower transfer;with min guard assist;ambulating;shower seat;rolling walker  OT Frequency: Min 2X/week   Barriers to D/C:            Co-evaluation              End of Session Equipment Utilized During Treatment: Gait belt;Rolling walker;Left knee immobilizer Nurse Communication: Mobility status;Precautions;Other (comment) (Pt/husband requesting [facility] services set up)  Activity Tolerance: Patient tolerated treatment well Patient left: in chair;with  call bell/phone within reach   Time:  -    Charges:    G-Codes:    Redmond Baseman 11/06/14, 1:43 PM

## 2014-10-27 NOTE — Progress Notes (Signed)
Physical Therapy Treatment Patient Details Name: Lisa Lucero MRN: [id number] DOB: 16-Sep-1935 Today's Date: 10/27/2014    History of Present Illness 79 y/o WF admitted forORIF of L patella after falling in bathroom last week.  Pt also reports falling on the stairs 2 weeks ago.    PT Comments    Patient progressing well. She does have 2 landing steps to enter into the house and wanted to practice as she is not planning on using WC to get into the house. Patient did well and husband was present during session. Patient safe to D/C from a mobility standpoint based on progression towards goals set on PT eval.  Follow Up Recommendations  Home health PT     Equipment Recommendations  Wheelchair (measurements PT);Wheelchair cushion (measurements PT)    Recommendations for Other Services       Precautions / Restrictions Precautions Precautions: Fall Required Braces or Orthoses: Knee Immobilizer - Left Knee Immobilizer - Left: On at all times Restrictions Weight Bearing Restrictions: Yes LLE Weight Bearing: Touchdown weight bearing    Mobility  Bed Mobility               General bed mobility comments: Patient up in bathroom session and in recliner after session  Transfers Overall transfer level: Needs assistance Equipment used: Rolling walker (2 wheeled)   Sit to Stand: Supervision         General transfer comment: Patient with safe technique. Supervision for safety  Ambulation/Gait Ambulation/Gait assistance: Min guard Ambulation Distance (Feet): 80 Feet Assistive device: Rolling walker (2 wheeled) Gait Pattern/deviations: Step-to pattern     General Gait Details: Cues to ensure TWB status. Patient with good positioning inside of RW this session   Stairs Stairs: Yes Stairs assistance: Min assist Stair Management: Step to pattern;Backwards;No rails;With walker Number of Stairs: 2 General stair comments: Patient practiced one step x2 with cues for techique  and min A to ensure balance  Wheelchair Mobility    Modified Rankin (Stroke Patients Only)       Balance                                    Cognition Arousal/Alertness: Awake/alert Behavior During Therapy: WFL for tasks assessed/performed Overall Cognitive Status: Within Functional Limits for tasks assessed                      Exercises      General Comments        Pertinent Vitals/Pain Pain Score: 4  Pain Location: L knee Pain Descriptors / Indicators: Burning Pain Intervention(s): Monitored during session    Home Living                      Prior Function            PT Goals (current goals can now be found in the care plan section) Progress towards PT goals: Progressing toward goals    Frequency  Min 5X/week    PT Plan Current plan remains appropriate    Co-evaluation             End of Session Equipment Utilized During Treatment: Gait belt;Left knee immobilizer Activity Tolerance: Patient tolerated treatment well Patient left: in chair;with family/visitor present;with call bell/phone within reach     Time: 3086-5784 PT Time Calculation (min): 25 min  Charges:  $Gait Training: 23-37 mins  G Codes:      Jacqualyn Posey 10/27/2014, 9:45 AM 10/27/2014 Jacqualyn Posey PTA [phone] pager [phone] office

## 2014-10-27 NOTE — Progress Notes (Signed)
ANTICOAGULATION CONSULT NOTE - Follow Up Consult  Pharmacy Consult:  Coumadin Indication: atrial fibrillation and VTE prophylaxis s/p patella fracture repair  Allergies  Allergen Reactions  . Celebrex [Celecoxib] Diarrhea  . Lipitor [Atorvastatin Calcium] Other (See Comments)    myalgias    Patient Measurements: Height: 5\' 6"  (167.6 cm) Weight: 125 lb (56.7 kg) IBW/kg (Calculated) : 59.3  Vital Signs: Temp: 98.6 F (37 C) (10/28 0521) Temp Source: Oral (10/28 0521) BP: 154/74 mmHg (10/28 0521) Pulse Rate: 69 (10/28 0521)  Labs:  Recent Labs  10/25/14 0957 10/26/14 0520 10/27/14 0617  HGB 7.6* 9.9*  --   HCT 25.0* 30.1*  --   PLT 381  --   --   APTT 34  --   --   LABPROT 14.4 13.7 14.4  INR 1.11 1.03 1.11  CREATININE 0.89  --   --     Estimated Creatinine Clearance: 47.4 ml/min (by C-G formula based on Cr of 0.89).    Assessment: 35 YOF on Coumadin PTA for history of Afib.  Coumadin now resumed s/p patella fracture repair.  INR is sub-therapeutic as expected.  No bleeding reported.   Goal of Therapy:  INR 2-3    Plan:  - Coumadin 5mg  PO today, give prior to discharge then recommend discharging on home dose - Daily PT / INR - Consider changing IVF to NS d/t hyponatremia    Amitai Delaughter D. Mina Marble, PharmD, BCPS Pager:  [phone] 10/27/2014, 10:21 AM

## 2014-10-27 NOTE — Progress Notes (Signed)
Subjective: 2 Days Post-Op Procedure(s) (LRB): OPEN REDUCTION INTERNAL (ORIF) FIXATION LEFT PATELLA (Left) Patient reports pain as moderate.   Terrible pain last night much better now. Still feels she can be managed at home with family Objective: Vital signs in last 24 hours: Temp:  [98 F (36.7 C)-98.6 F (37 C)] 98.6 F (37 C) (10/28 0521) Pulse Rate:  [59-78] 69 (10/28 0521) Resp:  [18-20] 20 (10/28 0521) BP: (149-182)/(72-89) 154/74 mmHg (10/28 0521) SpO2:  [95 %-100 %] 95 % (10/28 0521)  Intake/Output from previous day:   Intake/Output this shift:     Recent Labs  10/25/14 0957 10/26/14 0520  HGB 7.6* 9.9*    Recent Labs  10/25/14 0957 10/26/14 0520  WBC 4.2  --   RBC 3.42*  --   HCT 25.0* 30.1*  PLT 381  --     Recent Labs  10/25/14 0957  NA 133*  K 3.6*  CL 96  CO2 22  BUN 18  CREATININE 0.89  GLUCOSE 99  CALCIUM 9.7    Recent Labs  10/26/14 0520 10/27/14 0617  INR 1.03 1.11    Neurovascular intact Sensation intact distally Dorsiflexion/Plantar flexion intact Incision: dressing C/D/I  Assessment/Plan: 2 Days Post-Op Procedure(s) (LRB): OPEN REDUCTION INTERNAL (ORIF) FIXATION LEFT PATELLA (Left) Up with therapy Discharge home with home health today if she does well with PT.  Will check on pt this afternoon and make decision after therapy.  Heston Widener M 10/27/2014, 8:42 AM

## 2014-10-27 NOTE — Progress Notes (Signed)
Patient ID: Lisa Lucero, female   DOB: 1935/05/23, 79 y.o.   MRN: [id number] Not ordering HHPT/OT due to coumadin and skin healing for one week. ( see my other note).has office appt  11/09/14

## 2014-10-29 ENCOUNTER — Encounter (HOSPITAL_COMMUNITY): Payer: Self-pay | Admitting: Orthopaedic Surgery

## 2014-11-04 ENCOUNTER — Ambulatory Visit (INDEPENDENT_AMBULATORY_CARE_PROVIDER_SITE_OTHER): Payer: Medicare Other | Admitting: Pharmacist Clinician (PhC)/ Clinical Pharmacy Specialist

## 2014-11-04 DIAGNOSIS — I4891 Unspecified atrial fibrillation: Secondary | ICD-10-CM | POA: Diagnosis not present

## 2014-11-04 DIAGNOSIS — Z5181 Encounter for therapeutic drug level monitoring: Secondary | ICD-10-CM | POA: Diagnosis not present

## 2014-11-04 LAB — POCT INR: INR: 1.3

## 2014-11-09 DIAGNOSIS — Z23 Encounter for immunization: Secondary | ICD-10-CM | POA: Diagnosis not present

## 2014-11-09 DIAGNOSIS — S82012D Displaced osteochondral fracture of left patella, subsequent encounter for closed fracture with routine healing: Secondary | ICD-10-CM | POA: Diagnosis not present

## 2014-11-09 NOTE — Discharge Summary (Signed)
Physician Discharge Summary  Patient ID: Lisa Lucero MRN: [id number] DOB/AGE: [birth year] 79 y.o.  Admit date: 10/25/2014 Discharge date: 10/27/2014  Admission Diagnoses:  Left patella fracture  Discharge Diagnoses:  Principal Problem:   Left patella fracture   Past Medical History  Diagnosis Date  . Paroxysmal atrial fibrillation   . History of embolic stroke 04/1659  . Erosive gastritis     with GI bleed thought due to elevated INR and duodenitis  . Hyperthyroidism   . Peripheral edema   . Long-term (current) use of anticoagulants   . HTN (hypertension)   . Stroke   . GERD (gastroesophageal reflux disease)   . Back pain   . COPD (chronic obstructive pulmonary disease)     pt denies on 09/27/14  . H/O hypercholesterolemia     PMH: only  . Arthritis   . Anemia     Iron deficiency anemia  . Hypothyroidism     Surgeries: Procedure(s): OPEN REDUCTION INTERNAL (ORIF) FIXATION LEFT PATELLA on 10/25/2014   Consultants (if any):    Discharged Condition: Improved  Hospital Course: PYPER OLEXA is an 79 y.o. female who was admitted 10/25/2014 with a diagnosis of Left patella fracture and went to the operating room on 10/25/2014 and underwent the above named procedures.    She was given perioperative antibiotics:      Anti-infectives    Start     Dose/Rate Route Frequency Ordered Stop   10/25/14 2000  ceFAZolin (ANCEF) IVPB 1 g/50 mL premix     1 g100 mL/hr over 30 Minutes Intravenous Every 6 hours 10/25/14 1842 10/26/14 0812   10/25/14 1315  ceFAZolin (ANCEF) IVPB 2 g/50 mL premix  Status:  Discontinued     2 g100 mL/hr over 30 Minutes Intravenous On call to O.R. 10/25/14 1201 10/25/14 1823   10/25/14 1212  ceFAZolin (ANCEF) 2-3 GM-% IVPB SOLR    Comments:  Precious Haws   : cabinet override      10/25/14 1212 10/25/14 1320    Pt treated by PT for touch down weight bearing on the operative extremity using a walker and no motion of the left knee until return  office visit. Knee immobilizer was worn at all times.    She was given sequential compression devices, early ambulation, and coumadin for DVT prophylaxis.  She benefited maximally from the hospital stay and there were no complications.    Recent vital signs:  Filed Vitals:   10/27/14 1400  BP: 154/73  Pulse: 65  Temp: 98.4 F (36.9 C)  Resp: 18    Recent laboratory studies:  Lab Results  Component Value Date   HGB 9.9* 10/26/2014   HGB 7.6* 10/25/2014   HGB 8.9* 09/27/2014   Lab Results  Component Value Date   WBC 4.2 10/25/2014   PLT 381 10/25/2014   Lab Results  Component Value Date   INR 1.3 11/04/2014   Lab Results  Component Value Date   NA 133* 10/25/2014   K 3.6* 10/25/2014   CL 96 10/25/2014   CO2 22 10/25/2014   BUN 18 10/25/2014   CREATININE 0.89 10/25/2014   GLUCOSE 99 10/25/2014    Discharge Medications:     Medication List    TAKE these medications        diltiazem 240 MG 24 hr tablet  Commonly known as:  CARDIZEM LA  Take 240 mg by mouth daily.     ferrous sulfate 325 (65 FE) MG tablet  Take 325  mg by mouth daily.     hydrochlorothiazide 25 MG tablet  Commonly known as:  HYDRODIURIL  Take 12.5 mg by mouth daily.     hydrocortisone 25 MG suppository  Commonly known as:  ANUSOL-HC  Place 25 mg rectally 2 (two) times daily as needed for hemorrhoids.     levothyroxine 25 MCG tablet  Commonly known as:  SYNTHROID, LEVOTHROID  Take 25 mcg by mouth daily before breakfast.     lisinopril 20 MG tablet  Commonly known as:  PRINIVIL,ZESTRIL  Take 20 mg by mouth 2 (two) times daily.     metoprolol 50 MG tablet  Commonly known as:  LOPRESSOR  Take 50 mg by mouth 2 (two) times daily.     multivitamin with minerals Tabs tablet  Take 1 tablet by mouth daily.     nitroGLYCERIN 0.4 MG SL tablet  Commonly known as:  NITROSTAT  Place 1 tablet (0.4 mg total) under the tongue every 5 (five) minutes as needed for chest pain.      oxyCODONE-acetaminophen 5-325 MG per tablet  Commonly known as:  PERCOCET/ROXICET  Take 1 tablet by mouth every 6 (six) hours as needed for severe pain.     oxyCODONE-acetaminophen 5-325 MG per tablet  Commonly known as:  ROXICET  Take 1-2 tablets by mouth every 4 (four) hours as needed for severe pain.     pantoprazole 40 MG tablet  Commonly known as:  PROTONIX  Take 40 mg by mouth daily.     potassium chloride 10 MEQ tablet  Commonly known as:  K-DUR,KLOR-CON  Take 1 tablet (10 mEq total) by mouth 2 (two) times daily.     THERA TEARS ALLERGY 0.025 % ophthalmic solution  Generic drug:  ketotifen  Place 1 drop into both eyes 2 (two) times daily as needed (for dry eyes).     warfarin 5 MG tablet  Commonly known as:  COUMADIN  Take 2.5-5 mg by mouth daily. 5mg  on thu, and sun, 2.5 mg all other days     zolpidem 6.25 MG CR tablet  Commonly known as:  AMBIEN CR  Take 6.25 mg by mouth at bedtime.        Diagnostic Studies: Dg Knee 4 Views W/patella Left  10/25/2014   CLINICAL DATA:  ORIF of left patellar fracture  EXAM: LEFT KNEE - COMPLETE 4+ VIEW; DG C-ARM 61-120 MIN  COMPARISON:  None.  FINDINGS: Four spot films were obtained intraoperatively. 8.4 seconds of fluoroscopy was utilized. There are 2 fixation screws identified with a fixation wire traversing the course of the 2 screws approximating the patellar fragments. There are in near anatomic alignment.  IMPRESSION: ORIF left patellar fracture   Electronically Signed   By: Inez Catalina M.D.   On: 10/25/2014 14:24   Dg C-arm 1-60 Min  10/25/2014   CLINICAL DATA:  ORIF of left patellar fracture  EXAM: LEFT KNEE - COMPLETE 4+ VIEW; DG C-ARM 61-120 MIN  COMPARISON:  None.  FINDINGS: Four spot films were obtained intraoperatively. 8.4 seconds of fluoroscopy was utilized. There are 2 fixation screws identified with a fixation wire traversing the course of the 2 screws approximating the patellar fragments. There are in near anatomic  alignment.  IMPRESSION: ORIF left patellar fracture   Electronically Signed   By: Inez Catalina M.D.   On: 10/25/2014 14:24    Disposition: 01-Home or Self Care  Discharge Instructions    Call MD / Call 911    Complete by:  As directed   If you experience chest pain or shortness of breath, CALL 911 and be transported to the hospital emergency room.  If you develope a fever above 101 F, pus (white drainage) or increased drainage or redness at the wound, or calf pain, call your surgeon's office.     Constipation Prevention    Complete by:  As directed   Drink plenty of fluids.  Prune juice may be helpful.  You may use a stool softener, such as Colace (over the counter) 100 mg twice a day.  Use MiraLax (over the counter) for constipation as needed.     Diet - low sodium heart healthy    Complete by:  As directed      Increase activity slowly as tolerated    Complete by:  As directed          TOUCH DOWN WEIGHT BEARING ON LEFT LOWER EXTREMITY. WEAR KNEE IMMOBILIZER AT ALL TIMES.   KEEP DRESSING DRY, CLEAN AND INTACT. ICE PACKS DAILY OR AS NEEDED FOR PAIN AND SWELLING ANKLE PUMPS 3 TIMES DAILY BILATERAL   Follow-up Information    Follow up with YATES,MARK C, MD. Schedule an appointment as soon as possible for a visit in 1 week.   Specialty:  Orthopedic Surgery   Contact information:   [place] [address] [phone]        Signed: Epimenio Foot 11/09/2014, 3:23 PM

## 2014-11-11 ENCOUNTER — Ambulatory Visit (INDEPENDENT_AMBULATORY_CARE_PROVIDER_SITE_OTHER): Payer: Medicare Other | Admitting: Pharmacist

## 2014-11-11 DIAGNOSIS — Z5181 Encounter for therapeutic drug level monitoring: Secondary | ICD-10-CM | POA: Diagnosis not present

## 2014-11-11 DIAGNOSIS — I4891 Unspecified atrial fibrillation: Secondary | ICD-10-CM

## 2014-11-11 LAB — POCT INR: INR: 2.7

## 2014-11-15 LAB — HM DEXA SCAN

## 2014-11-16 ENCOUNTER — Telehealth: Payer: Self-pay | Admitting: Family Medicine

## 2014-11-16 DIAGNOSIS — M25561 Pain in right knee: Secondary | ICD-10-CM | POA: Diagnosis not present

## 2014-11-16 DIAGNOSIS — M6281 Muscle weakness (generalized): Secondary | ICD-10-CM | POA: Diagnosis not present

## 2014-11-16 DIAGNOSIS — R269 Unspecified abnormalities of gait and mobility: Secondary | ICD-10-CM | POA: Diagnosis not present

## 2014-11-16 DIAGNOSIS — M25461 Effusion, right knee: Secondary | ICD-10-CM | POA: Diagnosis not present

## 2014-11-16 NOTE — Telephone Encounter (Signed)
Will do.  I do not need to see them prior to appointment (since it is an acute visit, not CPE); just send back with the patient at her appointment.  Thanks!

## 2014-11-16 NOTE — Telephone Encounter (Signed)
Records received from previous doc. Pt has an appt tomorrow and will send records back at her appt. Please advise if you would like to see records prior to her appt. Please note what needs to be scanned of abstracted.

## 2014-11-17 ENCOUNTER — Ambulatory Visit (INDEPENDENT_AMBULATORY_CARE_PROVIDER_SITE_OTHER): Payer: Medicare Other | Admitting: Family Medicine

## 2014-11-17 ENCOUNTER — Encounter: Payer: Self-pay | Admitting: Family Medicine

## 2014-11-17 VITALS — BP 128/78 | HR 64 | Temp 98.2°F | Ht 66.0 in | Wt 118.0 lb

## 2014-11-17 DIAGNOSIS — N949 Unspecified condition associated with female genital organs and menstrual cycle: Secondary | ICD-10-CM | POA: Diagnosis not present

## 2014-11-17 DIAGNOSIS — K648 Other hemorrhoids: Secondary | ICD-10-CM | POA: Diagnosis not present

## 2014-11-17 DIAGNOSIS — R229 Localized swelling, mass and lump, unspecified: Secondary | ICD-10-CM

## 2014-11-17 DIAGNOSIS — K644 Residual hemorrhoidal skin tags: Secondary | ICD-10-CM

## 2014-11-17 DIAGNOSIS — N9489 Other specified conditions associated with female genital organs and menstrual cycle: Secondary | ICD-10-CM

## 2014-11-17 LAB — POCT URINALYSIS DIPSTICK
BILIRUBIN UA: NEGATIVE
Blood, UA: NEGATIVE
GLUCOSE UA: NEGATIVE
KETONES UA: NEGATIVE
LEUKOCYTES UA: NEGATIVE
Nitrite, UA: NEGATIVE
Spec Grav, UA: 1.02
Urobilinogen, UA: NEGATIVE
pH, UA: 6

## 2014-11-17 MED ORDER — HYDROCORTISONE 2.5 % RE CREA: 1.0000 "application " | TOPICAL_CREAM | Freq: Two times a day (BID) | RECTAL | Status: DC | PRN

## 2014-11-17 NOTE — Patient Instructions (Signed)
  Make sure to drink plenty of fluid and eat a high fiber diet to help with constipation. Take a stool softener such as Colace 100mg  twice daily. Add Miralax--take 1/2 - 1 capful daily.  Cut back on the dose or frequency if your stools are too frequent or too loose. The hydrocodone is contributing to the constipation--try and cut back the frequency of use (ie see if you can take it just 3 times daily rather than 4)--as your recent fracture heals, the pain should get a littles. Do NOT take Aleve--you shouldn't be taking this along with being on coumadin. Instead, use Tylenol for pain (but not at the same time as the hydrocodone, which also contains acetaminophen).  Stop using Preparation H and use the prescription anusol externally as directed. You may continue to use the lidocaine medication (recticare) as needed for pain (this doesn't treat the inflammation of the hemorrhoids, just numbs the pain).  Your vaginal discomfort is due to mild atrophic changes, as well as a small cut.  This is why it burns every time the urine touches the skin.  Using a skin barrier/protectant such as zinc oxide (ie Desitin diaper cream) will help protect this area, and minimize further irritation. Use short-term until the pain has resolved.  Ultimately, if you are having ongoing problems with vaginal dryness or pain, we might need to look into topical estrogens (?I need to research why it was stopped, etc).   We need to remove the lesion on your chest.  I do not want to do it while you are on both aleve and coumadin.  Let's schedule the excision for 2 weeks.

## 2014-11-17 NOTE — Progress Notes (Signed)
Chief Complaint  Patient presents with  . Hemorrhoids    new patient has been having hemorrhoid flare up and vaginal burning x 3 months.   . Mass    has a bump on her chest, left side that she would like you to look at if you have time.    She fractured her patella a couple of weeks ago, required surgical repair.  She had back surgery prior to that. She was given narcotics for pain control, and her constipation has gotten worse related to the medications.  Her hemorrhoids have been flaring over the last week.  They are painful, swollen.  Swelling has improved some today, she reports passing something very hard (stool) and has felt better since doing so. She has not had any bleeding. She has used Recticare (topical lidocaine) with good results, but only temporarily.  She hasn't been using preparation H (she has it).  She has also been using Anusol suppository. She reports having both internal and external hemorrhoids.  She is complaining of vaginal burning over the last 2 weeks. Denies any vaginal discharge. She has been using Vagisil, which has temporarily helped.  Denies any vaginal bleeding.  She denies any recent antibiotics or itching. There is some stinging/burning pain, especially after voiding, externally, when the urine contacts the skin.  She noticed a lump growing on her left chest.  She has noticed it about 4-5 months.  It grew quickly, but hasn't changed in size recently.  There is no itching or tenderness, no drainage or other skin concerns.  Past Medical History  Diagnosis Date  . Paroxysmal atrial fibrillation   . History of embolic stroke 04/8098  . Erosive gastritis     with GI bleed thought due to elevated INR and duodenitis  . Hyperthyroidism   . Peripheral edema   . Long-term (current) use of anticoagulants   . HTN (hypertension)   . Stroke   . GERD (gastroesophageal reflux disease)   . Back pain   . COPD (chronic obstructive pulmonary disease)     pt denies on 09/27/14   . H/O hypercholesterolemia     PMH: only  . Arthritis   . Anemia     Iron deficiency anemia  . Hypothyroidism    Past Surgical History  Procedure Laterality Date  . Hemorrhoid surgery    . Operative hysteroscopy    . Abdominal hysterectomy    . Cholecystectomy  03/11/2012    Procedure: LAPAROSCOPIC CHOLECYSTECTOMY WITH INTRAOPERATIVE CHOLANGIOGRAM;  Surgeon: Joyice Faster. Cornett, MD;  Location: [facility];  Service: General;  Laterality: N/A;  . Breast enhancement surgery    . Colonoscopy    . Wisdom tooth extraction    . Lumbar laminectomy N/A 09/29/2014    Procedure: L4-5 Decompression, Hemi-Laminectomy,  Removal Free Fragment;  Surgeon: Marybelle Killings, MD;  Location: [facility];  Service: Orthopedics;  Laterality: N/A;  . Orif patella Left 10/25/2014    Procedure: OPEN REDUCTION INTERNAL (ORIF) FIXATION LEFT PATELLA;  Surgeon: Marybelle Killings, MD;  Location: [facility];  Service: Orthopedics;  Laterality: Left;   History   Social History  . Marital Status: Married    Spouse Name: N/A    Number of Children: N/A  . Years of Education: N/A   Occupational History  . Not on file.   Social History Main Topics  . Smoking status: Former Smoker    Types: Cigarettes    Quit date: 09/30/2010  . Smokeless tobacco: Never Used  . Alcohol  Use: No  . Drug Use: No  . Sexual Activity: Not Currently   Other Topics Concern  . Not on file   Social History Narrative   Married, lives with husband.  2 children--one in [place] and one in [place] (daughters)   Family History  Problem Relation Age of Onset  . Stroke Mother   . Dementia Father   . Hypertension    . Arthritis    . Cancer Brother     lung   Outpatient Encounter Prescriptions as of 11/17/2014  Medication Sig Note  . diltiazem (CARDIZEM LA) 240 MG 24 hr tablet Take 240 mg by mouth daily.   . ferrous sulfate 325 (65 FE) MG tablet Take 325 mg by mouth daily.   . hydrochlorothiazide (HYDRODIURIL) 25 MG tablet Take 12.5 mg by mouth  daily.    Marland Kitchen HYDROcodone-acetaminophen (NORCO/VICODIN) 5-325 MG per tablet Take 1 tablet by mouth every 6 (six) hours as needed.  11/17/2014: Taking every 6 hours  . hydrocortisone (ANUSOL-HC) 25 MG suppository Place 25 mg rectally 2 (two) times daily as needed for hemorrhoids.    Marland Kitchen levothyroxine (SYNTHROID, LEVOTHROID) 25 MCG tablet Take 25 mcg by mouth daily before breakfast.   . lisinopril (PRINIVIL,ZESTRIL) 20 MG tablet Take 20 mg by mouth 2 (two) times daily.    . metoprolol (LOPRESSOR) 50 MG tablet Take 50 mg by mouth 2 (two) times daily.     . Multiple Vitamin (MULITIVITAMIN WITH MINERALS) TABS Take 1 tablet by mouth daily.   . pantoprazole (PROTONIX) 40 MG tablet Take 40 mg by mouth daily.    . potassium chloride (K-DUR,KLOR-CON) 10 MEQ tablet Take 1 tablet (10 mEq total) by mouth 2 (two) times daily.   Marland Kitchen warfarin (COUMADIN) 5 MG tablet Take 2.5-5 mg by mouth daily. 5mg  on thu, and sun, 2.5 mg all other days 11/17/2014: Takes whole tablet on Thurs and Sun, 1/2 tablet all other days  . zolpidem (AMBIEN CR) 6.25 MG CR tablet Take 6.25 mg by mouth at bedtime.    . [DISCONTINUED] naproxen sodium (ANAPROX) 220 MG tablet Take 220 mg by mouth 2 (two) times daily with a meal. 11/17/2014: She started taking this when she was changed from oxycodone to hydrocodone  . hydrocortisone (PROCTOSOL HC) 2.5 % rectal cream Place 1 application rectally 2 (two) times daily as needed for hemorrhoids or itching.   Marland Kitchen ketotifen (THERA TEARS ALLERGY) 0.025 % ophthalmic solution Place 1 drop into both eyes 2 (two) times daily as needed (for dry eyes).   . nitroGLYCERIN (NITROSTAT) 0.4 MG SL tablet Place 1 tablet (0.4 mg total) under the tongue every 5 (five) minutes as needed for chest pain.   . [DISCONTINUED] oxyCODONE-acetaminophen (PERCOCET/ROXICET) 5-325 MG per tablet Take 1 tablet by mouth every 6 (six) hours as needed for severe pain.    . [DISCONTINUED] oxyCODONE-acetaminophen (ROXICET) 5-325 MG per tablet Take  1-2 tablets by mouth every 4 (four) hours as needed for severe pain.    (anusol HC cream rx'd today)  Allergies  Allergen Reactions  . Celebrex [Celecoxib] Diarrhea  . Lipitor [Atorvastatin Calcium] Other (See Comments)    myalgias   ROS:  Denies fevers, chills, URI symptoms, chest pain, shortness of breath.  No abdominal pain, nausea, vomiting.  Mild dysuria related to external vaginal area (not internal).  +constipation and hemorrhoids as per HPI.  +knee pain, controlled by hydrocodone.  Denies bleeding, bruising, rash.   No headaches, dizziness, falls (since prior to surgery). Moods are  good.  PHYSICAL EXAM: BP 128/78 mmHg  Pulse 64  Temp(Src) 98.2 F (36.8 C) (Tympanic)  Ht 5\' 6"  (1.676 m)  Wt 118 lb (53.524 kg)  BMI 19.05 kg/m2 Pleasant, elderly female in no distress HEENT:  PERRL, EOMI, conjunctiva clear.  Healing ecchymosis L cheek.  OP clear Neck: no lymphadenopathy or mass Heart: Regular rate and rhythm (not irregular during visit) Lungs clear bilaterally Abdomen: soft, nontender, no mass WHSS left knee, vertical over patella, healing well. Trace edema left ankle Left chest wall--vesicular appearing superficial mass that is pink with some telangiectasias.  It is nontender and measures 1 x 0.5 cm in size.  There is some tissue deep to this lesion (not just superficial). Surrounding skin appears normal, no induration or erythema. External vaginal area--somewhat atrophic.  Small red line (healing abrasion) noted on the right posterior area.  Rest appears normal Mild external hemorrhoidal tags, only minimally inflamed. No thrombosed hemorrhoids.  Internal exam--no significant internal hemorrhoids palpable, nontender.  Trace heme + stool  ASSESSMENT/PLAN:  Vaginal burning - small abrasion noted.  atrophic vaginitis noted (mild). continue vagisil. zinc oxide as barrier prn.  review records; consider topical estrogen if not improving - Plan: POCT Urinalysis Dipstick  External  hemorrhoid - only mildly inflamed. no thrombosis. anusol HC externally BID prn, lidocaine prn pain. Work to alleviate constipation - Plan: hydrocortisone (PROCTOSOL HC) 2.5 % rectal cream  Skin nodule - left chest wall.  excision recommended (given size, nodularity below vesicular portion)   Make sure to drink plenty of fluid and eat a high fiber diet to help with constipation. Take a stool softener such as Colace 100mg  twice daily. Add Miralax--take 1/2 - 1 capful daily.  Cut back on the dose or frequency if your stools are too frequent or too loose. The hydrocodone is contributing to the constipation--try and cut back the frequency of use (ie see if you can take it just 3 times daily rather than 4)--as your recent fracture heals, the pain should get a littles. Do NOT take Aleve--you shouldn't be taking this along with being on coumadin. Instead, use Tylenol for pain (but not at the same time as the hydrocodone, which also contains acetaminophen).  Stop using Preparation H and use the prescription anusol externally as directed. You may continue to use the lidocaine medication (recticare) as needed for pain (this doesn't treat the inflammation of the hemorrhoids, just numbs the pain).  Your vaginal discomfort is due to mild atrophic changes, as well as a small cut.  This is why it burns every time the urine touches the skin.  Using a skin barrier/protectant such as zinc oxide (ie Desitin diaper cream) will help protect this area, and minimize further irritation. Use short-term until the pain has resolved.  Ultimately, if you are having ongoing problems with vaginal dryness or pain, we might need to look into topical estrogens (?I need to research why it was stopped, etc).  We need to remove the lesion on your chest.  I do not want to do it while you are on both aleve and coumadin.  Let's schedule the excision for 2 weeks.    On coumadin and aleve currently--don't want to do yet. Advised to  stop using Aleve (due to being on coumadin--she was aware to avoid ibuprofen, but not aleve)

## 2014-11-23 DIAGNOSIS — M25661 Stiffness of right knee, not elsewhere classified: Secondary | ICD-10-CM | POA: Diagnosis not present

## 2014-11-23 DIAGNOSIS — R269 Unspecified abnormalities of gait and mobility: Secondary | ICD-10-CM | POA: Diagnosis not present

## 2014-11-23 DIAGNOSIS — M6281 Muscle weakness (generalized): Secondary | ICD-10-CM | POA: Diagnosis not present

## 2014-11-23 DIAGNOSIS — M25561 Pain in right knee: Secondary | ICD-10-CM | POA: Diagnosis not present

## 2014-11-26 ENCOUNTER — Other Ambulatory Visit: Payer: Self-pay | Admitting: Family Medicine

## 2014-11-29 ENCOUNTER — Ambulatory Visit (INDEPENDENT_AMBULATORY_CARE_PROVIDER_SITE_OTHER): Payer: Medicare Other

## 2014-11-29 ENCOUNTER — Encounter: Payer: Self-pay | Admitting: Family Medicine

## 2014-11-29 DIAGNOSIS — Z5181 Encounter for therapeutic drug level monitoring: Secondary | ICD-10-CM

## 2014-11-29 DIAGNOSIS — I4891 Unspecified atrial fibrillation: Secondary | ICD-10-CM | POA: Diagnosis not present

## 2014-11-29 LAB — POCT INR: INR: 1.5

## 2014-11-29 NOTE — Telephone Encounter (Signed)
Computer lists ambien CR 6.25, not the regular ambien.  I reviewed pt's records, and it looks like it was changed about a year ago to the [place] 10mg , to take 1/2 tablet.  Due to age, I cannot rx 10mg  tablet, but can prescribe 5mg  tablet, take qHS prn insomnia. It looks like she previously was getting #45, which is a 90 day supply.  Okay to change to #90 tablets, and needs to last for 3 months (pt is NOT to take full tablet).  Please d/c the CR, and call in the plain [place], but please let the patient know if the change in dose from her prior prescription.

## 2014-11-29 NOTE — Telephone Encounter (Signed)
Is this okay?

## 2014-11-30 ENCOUNTER — Other Ambulatory Visit: Payer: Self-pay

## 2014-11-30 DIAGNOSIS — M25561 Pain in right knee: Secondary | ICD-10-CM | POA: Diagnosis not present

## 2014-11-30 DIAGNOSIS — R269 Unspecified abnormalities of gait and mobility: Secondary | ICD-10-CM | POA: Diagnosis not present

## 2014-11-30 DIAGNOSIS — M25661 Stiffness of right knee, not elsewhere classified: Secondary | ICD-10-CM | POA: Diagnosis not present

## 2014-11-30 DIAGNOSIS — M6281 Muscle weakness (generalized): Secondary | ICD-10-CM | POA: Diagnosis not present

## 2014-11-30 HISTORY — PX: CYST EXCISION: SHX5701

## 2014-11-30 MED ORDER — ZOLPIDEM TARTRATE 5 MG PO TABS: 5.0000 mg | ORAL_TABLET | Freq: Every evening | ORAL | Status: DC | PRN

## 2014-11-30 NOTE — Telephone Encounter (Signed)
Called in [place] 5 mg as per Dr.Knapps note pt was very frustrated but was okay after I told her what Dr.Knapp said

## 2014-11-30 NOTE — Telephone Encounter (Signed)
I have called this in per note

## 2014-11-30 NOTE — Telephone Encounter (Signed)
Dr.knapp is this okay

## 2014-12-01 ENCOUNTER — Encounter: Payer: Self-pay | Admitting: Internal Medicine

## 2014-12-02 ENCOUNTER — Encounter: Payer: Self-pay | Admitting: Family Medicine

## 2014-12-02 ENCOUNTER — Other Ambulatory Visit: Payer: Self-pay | Admitting: Family Medicine

## 2014-12-02 ENCOUNTER — Ambulatory Visit (INDEPENDENT_AMBULATORY_CARE_PROVIDER_SITE_OTHER): Payer: Medicare Other | Admitting: Family Medicine

## 2014-12-02 ENCOUNTER — Ambulatory Visit: Payer: Medicare Other | Admitting: Medical

## 2014-12-02 VITALS — BP 140/80 | HR 72 | Ht 66.0 in

## 2014-12-02 DIAGNOSIS — L720 Epidermal cyst: Secondary | ICD-10-CM

## 2014-12-02 DIAGNOSIS — D235 Other benign neoplasm of skin of trunk: Secondary | ICD-10-CM | POA: Diagnosis not present

## 2014-12-02 NOTE — Progress Notes (Signed)
Chief Complaint  Patient presents with  . Procedure    removal cyst on chest. (down 7 lbs since last visit 11/17/14)   Patient presents for removal of cystic mass on her left chest. She has no specific complaints.  No longer taking pain medications for her patellar fracture.  +weight loss was noted today.  Appetite is good, but she doesn't eat as much, gets full sooner..  We discussed risks/benefits/alternatives, and she wishes to proceed with excision of the cystic mass. She understands the risk to be bleeding (she is on blood thinners), infection and scar.  PHYSICAL EXAM: BP 140/80 mmHg  Pulse 72  Ht 5\' 6"  (1.676 m) Pleasant, elderly female in no distress.  Wearing left knee brace Chest:  She has bilateral breast implants that are fairly mobile.  Left lateral chest there is a protuberant cystic mass measuring 0.7-0.9 cm in size, at least 1/2 cm in depth.  Nontender.  ASSESSMENT/PLAN:  Cyst of skin and subcutaneous tissue - Plan: PR EXC SKIN BENIG 1.1-2 CM TRUNK,ARM,LEG, CANCELED: Dermatology pathology  The area was prepped with betadine, anesthetized with 2% lidocaine with epinephrine.  Elliptical incision was made with 15 blade, and wound edges sutured closed with 4-0 nylon with interrupted sutures. During the process of removal, the cyst was punctured and deflated.  Drainage was clear mixed with blood.  She had some oozing during the procedure, less then 5cc blood loss.  She tolerated the procedure well.  No oozing or bleeding noted after wound closed.  Bacitracin and bandage was applied.  Wound care instructions were reviewed in detail. F/u in 10-12 days for suture removal.  Weight loss since last visit.  Encouraged adequate po intake. Start ensure once daily. Recheck weight at suture removal.  Consider labs/work-up at that visit

## 2014-12-02 NOTE — Patient Instructions (Signed)
Keep the wound covered and dry for 24 hours.  After that, you may wash normally shower).  Use antibacterial ointment (neosporin or bacitracin) twice daily.  You may keep it covered, or leave it open to air (keep it covered to keep it protected)   Biopsy Care After Refer to this sheet in the next few weeks. These instructions provide you with information on caring for yourself after your procedure. Your caregiver may also give you more specific instructions. Your treatment has been planned according to current medical practices, but problems sometimes occur. Call your caregiver if you have any problems or questions after your procedure. If you had a fine needle biopsy, you may have soreness at the biopsy site for 1 to 2 days. If you had an open biopsy, you may have soreness at the biopsy site for 3 to 4 days. HOME CARE INSTRUCTIONS   You may resume normal diet and activities as directed.  Change bandages (dressings) as directed. If your wound was closed with a skin glue (adhesive), it will wear off and begin to peel in 7 days.  Only take over-the-counter or prescription medicines for pain, discomfort, or fever as directed by your caregiver.  Ask your caregiver when you can bathe and get your wound wet. SEEK IMMEDIATE MEDICAL CARE IF:   You have increased bleeding (more than a small spot) from the biopsy site.  You notice redness, swelling, or increasing pain at the biopsy site.  You have pus coming from the biopsy site.  You have a fever.  You notice a bad smell coming from the biopsy site or dressing.  You have a rash, have difficulty breathing, or have any allergic problems. MAKE SURE YOU:   Understand these instructions.  Will watch your condition.  Will get help right away if you are not doing well or get worse. Document Released: 07/06/2005 Document Revised: 03/10/2012 Document Reviewed: 06/14/2011 [facility] Patient Information 2015 [place], [place]. This information is not  intended to replace advice given to you by your health care provider. Make sure you discuss any questions you have with your health care provider.

## 2014-12-03 DIAGNOSIS — M6281 Muscle weakness (generalized): Secondary | ICD-10-CM | POA: Diagnosis not present

## 2014-12-03 DIAGNOSIS — M25661 Stiffness of right knee, not elsewhere classified: Secondary | ICD-10-CM | POA: Diagnosis not present

## 2014-12-03 DIAGNOSIS — M25561 Pain in right knee: Secondary | ICD-10-CM | POA: Diagnosis not present

## 2014-12-03 DIAGNOSIS — R269 Unspecified abnormalities of gait and mobility: Secondary | ICD-10-CM | POA: Diagnosis not present

## 2014-12-06 ENCOUNTER — Encounter: Payer: Self-pay | Admitting: Family Medicine

## 2014-12-06 DIAGNOSIS — R269 Unspecified abnormalities of gait and mobility: Secondary | ICD-10-CM | POA: Diagnosis not present

## 2014-12-06 DIAGNOSIS — M25561 Pain in right knee: Secondary | ICD-10-CM | POA: Diagnosis not present

## 2014-12-06 DIAGNOSIS — M6281 Muscle weakness (generalized): Secondary | ICD-10-CM | POA: Diagnosis not present

## 2014-12-06 DIAGNOSIS — M25661 Stiffness of right knee, not elsewhere classified: Secondary | ICD-10-CM | POA: Diagnosis not present

## 2014-12-07 ENCOUNTER — Ambulatory Visit (INDEPENDENT_AMBULATORY_CARE_PROVIDER_SITE_OTHER): Payer: Medicare Other | Admitting: Cardiology

## 2014-12-07 VITALS — BP 132/70 | HR 62 | Ht 66.0 in | Wt 115.0 lb

## 2014-12-07 DIAGNOSIS — I480 Paroxysmal atrial fibrillation: Secondary | ICD-10-CM | POA: Diagnosis not present

## 2014-12-07 DIAGNOSIS — I119 Hypertensive heart disease without heart failure: Secondary | ICD-10-CM

## 2014-12-07 DIAGNOSIS — E785 Hyperlipidemia, unspecified: Secondary | ICD-10-CM | POA: Diagnosis not present

## 2014-12-07 NOTE — Progress Notes (Signed)
Lisa Lucero Date of Birth:  02-25-1935 [facility] [address] [place], [place]  [address] [phone]        Fax   [phone]   History of Present Illness: This pleasant 79 year old woman is seen back for a scheduled followup office visit. She has a past history of paroxysmal atrial fibrillation. She was last hospitalized for atrial fibrillation in March of 2010. She is on long-term Coumadin. After she had gone off Coumadin and was maintaining normal sinus rhythm she was hospitalized with an embolic stroke and was placed back on long-term Coumadin. The Coumadin had to be held in October 2011 when she was admitted to [facility] with a GI bleed secondary to erosive duodenitis. She's had no further problems with her stomach since being placed on long-term proton pump inhibitor.  She underwent back surgery by Dr. Lorin Mercy on 09/29/2014 And underwent surgery for her patella fracture on 10/25/2014 also by Dr. Rodell Perna.  Current Outpatient Prescriptions  Medication Sig Dispense Refill  . diltiazem (CARDIZEM LA) 240 MG 24 hr tablet Take 240 mg by mouth daily.    . ferrous sulfate 325 (65 FE) MG tablet Take 325 mg by mouth daily.    . hydrochlorothiazide (HYDRODIURIL) 25 MG tablet Take 12.5 mg by mouth daily.     . hydrocortisone (ANUSOL-HC) 25 MG suppository Place 25 mg rectally 2 (two) times daily as needed for hemorrhoids.     . hydrocortisone (PROCTOSOL HC) 2.5 % rectal cream Place 1 application rectally 2 (two) times daily as needed for hemorrhoids or itching. 30 g 1  . levothyroxine (SYNTHROID, LEVOTHROID) 25 MCG tablet Take 25 mcg by mouth daily before breakfast.    . lisinopril (PRINIVIL,ZESTRIL) 20 MG tablet Take 20 mg by mouth 2 (two) times daily.     . metoprolol (LOPRESSOR) 50 MG tablet Take 50 mg by mouth 2 (two) times daily.      . Multiple Vitamin (MULITIVITAMIN WITH MINERALS) TABS Take 1 tablet by mouth daily.    . nitroGLYCERIN (NITROSTAT) 0.4 MG SL tablet  Place 1 tablet (0.4 mg total) under the tongue every 5 (five) minutes as needed for chest pain. 25 tablet PRN  . pantoprazole (PROTONIX) 40 MG tablet Take 40 mg by mouth daily.     . potassium chloride (K-DUR,KLOR-CON) 10 MEQ tablet Take 1 tablet (10 mEq total) by mouth 2 (two) times daily. 60 tablet 6  . warfarin (COUMADIN) 5 MG tablet Take 2.5-5 mg by mouth daily. 5mg  on thu, and sun, 2.5 mg all other days    . zolpidem (AMBIEN) 5 MG tablet Take 1 tablet (5 mg total) by mouth at bedtime as needed for sleep. 90 tablet 0  . [DISCONTINUED] diphenhydrAMINE (BENADRYL) 25 MG tablet Take 25 mg by mouth every 6 (six) hours as needed.      . [DISCONTINUED] IRON PO Take 1 tablet by mouth daily.     . [DISCONTINUED] potassium chloride (K-DUR) 10 MEQ tablet Take 1 tablet (10 mEq total) by mouth 2 (two) times daily. 90 tablet 0   No current facility-administered medications for this visit.    Allergies  Allergen Reactions  . Celebrex [Celecoxib] Diarrhea  . Lipitor [Atorvastatin Calcium] Other (See Comments)    myalgias    Patient Active Problem List   Diagnosis Date Noted  . Atrial fibrillation 04/23/2011    Priority: High  . Benign hypertensive heart disease without heart failure 05/17/2011    Priority: Medium  .  Long-term (current) use of anticoagulants     Priority: Medium  . Left patella fracture 10/25/2014  . Encounter for therapeutic drug monitoring 02/19/2014  . Gallstone 03/08/2012  . Hemorrhagic cystitis 03/08/2012  . Leukocytosis 03/08/2012  . Hyponatremia 03/08/2012  . Hypochloremia 03/08/2012  . Hypokalemia 03/08/2012  . Dyslipidemia 05/17/2011  . History of embolic stroke   . Hyperthyroidism     History  Smoking status  . Former Smoker  . Types: Cigarettes  . Quit date: 09/30/2010  Smokeless tobacco  . Never Used    History  Alcohol Use No    Family History  Problem Relation Age of Onset  . Stroke Mother   . Dementia Father   . Hypertension    . Arthritis     . Cancer Brother     lung    Review of Systems: Constitutional: no fever chills diaphoresis or fatigue or change in weight.  Head and neck: no hearing loss, no epistaxis, no photophobia or visual disturbance. Respiratory: No cough, shortness of breath or wheezing. Cardiovascular: No chest pain peripheral edema, palpitations. Gastrointestinal: No abdominal distention, no abdominal pain, no change in bowel habits hematochezia or melena. Genitourinary: No dysuria, no frequency, no urgency, no nocturia. Musculoskeletal:No arthralgias, no back pain, no gait disturbance or myalgias. Neurological: No dizziness, no headaches, no numbness, no seizures, no syncope, no weakness, no tremors. Hematologic: No lymphadenopathy, no easy bruising. Psychiatric: No confusion, no hallucinations, no sleep disturbance.   Wt Readings from Last 3 Encounters:  12/07/14 115 lb (52.164 kg)  11/17/14 118 lb (53.524 kg)  10/25/14 125 lb (56.7 kg)    Physical Exam: Filed Vitals:   12/07/14 1359  BP: 132/70  Pulse: 62   The patient appears to be in no distress.  Head and neck exam reveals that the pupils are equal and reactive.  The extraocular movements are full.  There is no scleral icterus.  Mouth and pharynx are benign.  No lymphadenopathy.  No carotid bruits.  The jugular venous pressure is normal.  Thyroid is not enlarged or tender.  Chest is clear to percussion and auscultation.  No rales or rhonchi.  Expansion of the chest is symmetrical.  Heart reveals no abnormal lift or heave.  First and second heart sounds are normal.  There is no murmur gallop rub or click.  The abdomen is soft and nontender.  Bowel sounds are normoactive.  There is no hepatosplenomegaly or mass.  There are no abdominal bruits.  Extremities reveal no phlebitis or edema.  Pedal pulses are good.  There is no cyanosis or clubbing.  Neurologic exam is normal strength and no lateralizing weakness.  No sensory deficits.  Her left  leg is in a soft Ace wrap reflecting the recent patellar surgery. Integument reveals no rash  EKG today shows sinus rhythm with one premature atrial beat.  Since prior tracing of 09/27/14, no significant change.  Assessment / Plan: 1.  Paroxysmal atrial fibrillation, maintaining normal sinus rhythm 2.  Essential hypertension 3.  Recent surgery on left patella by Dr. Rodell Perna, doing well  Disposition: Continue on current medication.  Recheck in 6 months for follow-up office visit

## 2014-12-07 NOTE — Assessment & Plan Note (Signed)
Her lipids are followed by her PCP.  She is not currently on any statin therapy.

## 2014-12-07 NOTE — Assessment & Plan Note (Signed)
She remains on long-term Coumadin.  She is in normal sinus rhythm today.  She has not been aware of any recent episodes of atrial fibrillation.  She has had no further episodes of TIA or stroke.

## 2014-12-07 NOTE — Assessment & Plan Note (Signed)
No chest pain or shortness of breath.  No dizziness or syncope.

## 2014-12-07 NOTE — Patient Instructions (Signed)
Your physician recommends that you continue on your current medications as directed. Please refer to the Current Medication list given to you today.  Your physician wants you to follow-up in: 6 month ov You will receive a reminder letter in the mail two months in advance. If you don't receive a letter, please call our office to schedule the follow-up appointment.  

## 2014-12-09 DIAGNOSIS — M25561 Pain in right knee: Secondary | ICD-10-CM | POA: Diagnosis not present

## 2014-12-09 DIAGNOSIS — R269 Unspecified abnormalities of gait and mobility: Secondary | ICD-10-CM | POA: Diagnosis not present

## 2014-12-09 DIAGNOSIS — M25661 Stiffness of right knee, not elsewhere classified: Secondary | ICD-10-CM | POA: Diagnosis not present

## 2014-12-09 DIAGNOSIS — M6281 Muscle weakness (generalized): Secondary | ICD-10-CM | POA: Diagnosis not present

## 2014-12-13 ENCOUNTER — Encounter: Payer: Self-pay | Admitting: Family Medicine

## 2014-12-13 ENCOUNTER — Ambulatory Visit (INDEPENDENT_AMBULATORY_CARE_PROVIDER_SITE_OTHER): Payer: Medicare Other | Admitting: Family Medicine

## 2014-12-13 ENCOUNTER — Ambulatory Visit (INDEPENDENT_AMBULATORY_CARE_PROVIDER_SITE_OTHER): Payer: Medicare Other | Admitting: Surgery

## 2014-12-13 VITALS — BP 146/86 | HR 56 | Temp 97.5°F | Ht 66.0 in | Wt 115.0 lb

## 2014-12-13 DIAGNOSIS — R634 Abnormal weight loss: Secondary | ICD-10-CM

## 2014-12-13 DIAGNOSIS — K649 Unspecified hemorrhoids: Secondary | ICD-10-CM

## 2014-12-13 DIAGNOSIS — D509 Iron deficiency anemia, unspecified: Secondary | ICD-10-CM

## 2014-12-13 DIAGNOSIS — E039 Hypothyroidism, unspecified: Secondary | ICD-10-CM | POA: Diagnosis not present

## 2014-12-13 DIAGNOSIS — I480 Paroxysmal atrial fibrillation: Secondary | ICD-10-CM

## 2014-12-13 DIAGNOSIS — D239 Other benign neoplasm of skin, unspecified: Secondary | ICD-10-CM | POA: Diagnosis not present

## 2014-12-13 DIAGNOSIS — I4891 Unspecified atrial fibrillation: Secondary | ICD-10-CM | POA: Diagnosis not present

## 2014-12-13 DIAGNOSIS — Z4802 Encounter for removal of sutures: Secondary | ICD-10-CM

## 2014-12-13 DIAGNOSIS — Z5181 Encounter for therapeutic drug level monitoring: Secondary | ICD-10-CM

## 2014-12-13 LAB — POCT INR: INR: 2

## 2014-12-13 NOTE — Patient Instructions (Signed)
Drink Ensure at least once daily.  This is a nutritional shake with extra calories to help you maintain/gain weight.  Continue to eat at least 3 meals/day plus snacks.  We will be in touch with your blood test results soon, and let Dr. Chalmers Cater know the thyroid results.

## 2014-12-13 NOTE — Progress Notes (Signed)
Chief Complaint  Patient presents with  . Suture / Staple Removal    suture removal. Also states that she had a rectal surgery in the past-states that she had a miserable weekend dealing with hemorrhoid fllare up. ( I told her you would only address if you had time)   She hasn't had any problems related to the excisional biopsy. She did not check MyChart to see the results.  She hasn't had any pain, itching, drainage or swelling.  She is complaining today of rectal pain.  She has been using prescription cream prescribed at prior visit, and Rectocare to help with pain. Hemorrhoids aren't bleeding.  She felt something hard when she put in [place] recently. Pain in internal rectal area with sitting.  She was noted at her last visit to have had weight loss.  She hasn't started Ensure supplements as recommended.  She has hypothyroidism, and she sees Dr. Chalmers Cater yearly, thinks last TSH was in June.  She is on blood thinners, and is monitored by cardiology.  Had recent visit with Dr. Mare Ferrari, EKG was normal. Recent labs were reviewed:  Last Hg 9.9 per cardiologist 10/27    Chemistry      Component Value Date/Time   NA 133* 10/25/2014 0957   K 3.6* 10/25/2014 0957   CL 96 10/25/2014 0957   CO2 22 10/25/2014 0957   BUN 18 10/25/2014 0957   CREATININE 0.89 10/25/2014 0957      Component Value Date/Time   CALCIUM 9.7 10/25/2014 0957   ALKPHOS 50 10/25/2014 0957   AST 20 10/25/2014 0957   ALT 11 10/25/2014 0957   BILITOT 0.9 10/25/2014 0957      PMH, PSH, SH reviewed.  Outpatient Encounter Prescriptions as of 12/13/2014  Medication Sig Note  . diltiazem (CARDIZEM LA) 240 MG 24 hr tablet Take 240 mg by mouth daily.   . ferrous sulfate 325 (65 FE) MG tablet Take 325 mg by mouth daily.   . hydrochlorothiazide (HYDRODIURIL) 25 MG tablet Take 12.5 mg by mouth daily.    . hydrocortisone (PROCTOSOL HC) 2.5 % rectal cream Place 1 application rectally 2 (two) times daily as needed for  hemorrhoids or itching.   . levothyroxine (SYNTHROID, LEVOTHROID) 25 MCG tablet Take 25 mcg by mouth daily before breakfast.   . lisinopril (PRINIVIL,ZESTRIL) 20 MG tablet Take 20 mg by mouth 2 (two) times daily.    . metoprolol (LOPRESSOR) 50 MG tablet Take 50 mg by mouth 2 (two) times daily.     . Multiple Vitamin (MULITIVITAMIN WITH MINERALS) TABS Take 1 tablet by mouth daily.   . nitroGLYCERIN (NITROSTAT) 0.4 MG SL tablet Place 1 tablet (0.4 mg total) under the tongue every 5 (five) minutes as needed for chest pain.   . pantoprazole (PROTONIX) 40 MG tablet Take 40 mg by mouth daily.    . potassium chloride (K-DUR,KLOR-CON) 10 MEQ tablet Take 1 tablet (10 mEq total) by mouth 2 (two) times daily.   Marland Kitchen warfarin (COUMADIN) 5 MG tablet Take 2.5-5 mg by mouth daily. 11m on thu, and sun, 2.5 mg all other days 11/17/2014: Takes whole tablet on Thurs and Sun, 1/2 tablet all other days  . zolpidem (AMBIEN) 5 MG tablet Take 1 tablet (5 mg total) by mouth at bedtime as needed for sleep.   . hydrocortisone (ANUSOL-HC) 25 MG suppository Place 25 mg rectally 2 (two) times daily as needed for hemorrhoids.     Allergies  Allergen Reactions  . Celebrex [Celecoxib] Diarrhea  .  Lipitor [Atorvastatin Calcium] Other (See Comments)    myalgias   ROS:  No fevers, chills, headaches, dizziness, chest pain, palpitations.  No nausea, vomiting.  Denies constipation/straining, blood in stool. No rash, bleeding/bruising. No URI symptoms, edema or other concerns.  PHYSICAL EXAM: BP 146/86 mmHg  Pulse 56  Temp(Src) 97.5 F (36.4 C) (Tympanic)  Ht _0  (1.676 m)  Wt 115 lb (52.164 kg)  BMI 18.57 kg/m2  Well developed, pleasant, thin elderly female in no distress Left chest wall--healing wound.  Wound edges well approximated.  No erythema, drainage or swelling.  Sutures were easily removed. Neck: no lymphadenopathy, thyromegaly or mass Heart: regular rate and rhythm, no murmur Lungs: clear bilaterally Abdomen:  soft, nontender Rectal exam: External--hemorrhoidal tags externally, not inflamed. Internal exam--no masses normal sphincter tone.  Heme neg stool.  No masses  Pathology: Nodular hidradenoma  ASSESSMENT/PLAN:  Loss of weight - encouraged Ensure 1-2 daily. Check labs. May need further eval if labs normal and ongoing weight loss - Plan: Comprehensive metabolic panel, CBC with Differential, TSH, Ferritin, Iron  Hypothyroidism, unspecified hypothyroidism type - Plan: TSH  Anemia, iron deficiency - Plan: CBC with Differential, Ferritin, Iron  Hemorrhoids, unspecified hemorrhoid type - not inflamed enough to account for her pain.  consider sitting on donut.  Consider CT if ongoing significant discomfort (esp with wt loss)  Visit for suture removal  Hidradenoma - nodular; benign, completely excised  Rectal pain--reassured re: hemorrhoids. Weight loss is concerning. Hyponatremia, anemia   CBC, c-met, TSH Iron, ferritin  Copies of labs to Dr. Chalmers Cater

## 2014-12-14 DIAGNOSIS — S82012D Displaced osteochondral fracture of left patella, subsequent encounter for closed fracture with routine healing: Secondary | ICD-10-CM | POA: Diagnosis not present

## 2014-12-14 LAB — CBC WITH DIFFERENTIAL/PLATELET
Basophils Absolute: 0.1 10*3/uL (ref 0.0–0.1)
Basophils Relative: 1 % (ref 0–1)
Eosinophils Absolute: 0.1 10*3/uL (ref 0.0–0.7)
Eosinophils Relative: 2 % (ref 0–5)
HEMATOCRIT: 38.9 % (ref 36.0–46.0)
Hemoglobin: 12.6 g/dL (ref 12.0–15.0)
LYMPHS PCT: 20 % (ref 12–46)
Lymphs Abs: 1.4 10*3/uL (ref 0.7–4.0)
MCH: 27.8 pg (ref 26.0–34.0)
MCHC: 32.4 g/dL (ref 30.0–36.0)
MCV: 85.9 fL (ref 78.0–100.0)
MONO ABS: 0.5 10*3/uL (ref 0.1–1.0)
MONOS PCT: 8 % (ref 3–12)
MPV: 8.6 fL — ABNORMAL LOW (ref 9.4–12.4)
NEUTROS ABS: 4.7 10*3/uL (ref 1.7–7.7)
Neutrophils Relative %: 69 % (ref 43–77)
Platelets: 257 10*3/uL (ref 150–400)
RBC: 4.53 MIL/uL (ref 3.87–5.11)
RDW: 22.7 % — ABNORMAL HIGH (ref 11.5–15.5)
WBC: 6.8 10*3/uL (ref 4.0–10.5)

## 2014-12-14 LAB — IRON: IRON: 88 ug/dL (ref 42–145)

## 2014-12-14 LAB — COMPREHENSIVE METABOLIC PANEL
ALT: 15 U/L (ref 0–35)
AST: 19 U/L (ref 0–37)
Albumin: 4.4 g/dL (ref 3.5–5.2)
Alkaline Phosphatase: 47 U/L (ref 39–117)
BILIRUBIN TOTAL: 0.5 mg/dL (ref 0.2–1.2)
BUN: 20 mg/dL (ref 6–23)
CO2: 28 meq/L (ref 19–32)
CREATININE: 0.86 mg/dL (ref 0.50–1.10)
Calcium: 9.5 mg/dL (ref 8.4–10.5)
Chloride: 94 mEq/L — ABNORMAL LOW (ref 96–112)
Glucose, Bld: 72 mg/dL (ref 70–99)
Potassium: 3.7 mEq/L (ref 3.5–5.3)
Sodium: 131 mEq/L — ABNORMAL LOW (ref 135–145)
Total Protein: 6.4 g/dL (ref 6.0–8.3)

## 2014-12-14 LAB — FERRITIN: FERRITIN: 35 ng/mL (ref 10–291)

## 2014-12-14 LAB — TSH: TSH: 0.536 u[IU]/mL (ref 0.350–4.500)

## 2014-12-15 DIAGNOSIS — R269 Unspecified abnormalities of gait and mobility: Secondary | ICD-10-CM | POA: Diagnosis not present

## 2014-12-15 DIAGNOSIS — M6281 Muscle weakness (generalized): Secondary | ICD-10-CM | POA: Diagnosis not present

## 2014-12-15 DIAGNOSIS — M25661 Stiffness of right knee, not elsewhere classified: Secondary | ICD-10-CM | POA: Diagnosis not present

## 2014-12-15 DIAGNOSIS — M25561 Pain in right knee: Secondary | ICD-10-CM | POA: Diagnosis not present

## 2014-12-20 ENCOUNTER — Encounter: Payer: Self-pay | Admitting: Family Medicine

## 2014-12-20 ENCOUNTER — Ambulatory Visit (INDEPENDENT_AMBULATORY_CARE_PROVIDER_SITE_OTHER): Payer: Medicare Other | Admitting: Family Medicine

## 2014-12-20 VITALS — BP 114/70 | HR 60 | Ht 66.0 in | Wt 114.0 lb

## 2014-12-20 DIAGNOSIS — R195 Other fecal abnormalities: Secondary | ICD-10-CM

## 2014-12-20 DIAGNOSIS — G4700 Insomnia, unspecified: Secondary | ICD-10-CM

## 2014-12-20 DIAGNOSIS — K6289 Other specified diseases of anus and rectum: Secondary | ICD-10-CM | POA: Diagnosis not present

## 2014-12-20 DIAGNOSIS — R634 Abnormal weight loss: Secondary | ICD-10-CM | POA: Diagnosis not present

## 2014-12-20 NOTE — Progress Notes (Signed)
Chief Complaint  Patient presents with  . hemorrhoids    hemorrhoids or its a raw spot. she is using stuff but not helping. no blood   When inserting Rectocare last, she no longer felt a lump (as described at last visit, but wasn't appreciated upon my exam).  She is complaining of rawness and pain at the rectal opening.  Bowels are loose--she takes a stool softener plus a laxative (most days, holds it if stools are loose).  Hasn't seen any blood in the stool; denies any abdominal pain.  She has been drinking Ensure once daily (puts it in the freezer and pretends it is ice cream).  She feels well, feels like her appetite is normal.  She reports that she lost weight back when she was on pain pills (narcotics) related to surgeries, which decreased her appetite.  She is off narcotics, and appetite is better, but hasn't regained any weight.  She continues to take a tramadol prn knee pain, usually once, sometimes twice daily, but this doesn't seem to bother her stomach or appetite. She is asking for refill on tramadol today.  She is also asking for ambien prescription to be changed.  She previously was getting from Dr. Modena Morrow, 10mg  tablets, with directions to take 1/2 tablet daily.  Given her age, and that she was taking dose of 5mg , she was advised with her last refill request that we were changing her to 5mg  tablet.  She admits that she truly was taking "6-7mg " of ambien every night.  The is concerned because she admits that she has been taking 1 and 1/3 tablets at bedtime, and it will not last a full month  With this dose, it is effective, she falls asleep fine, has no problems. Admits that she hasn't tried taking 5mg  dose.  PMH, [place], SH reviewed Outpatient Encounter Prescriptions as of 12/20/2014  Medication Sig Note  . diltiazem (CARDIZEM LA) 240 MG 24 hr tablet Take 240 mg by mouth daily.   . ferrous sulfate 325 (65 FE) MG tablet Take 325 mg by mouth daily.   . hydrochlorothiazide (HYDRODIURIL) 25  MG tablet Take 12.5 mg by mouth daily.    . hydrocortisone (ANUSOL-HC) 25 MG suppository Place 25 mg rectally 2 (two) times daily as needed for hemorrhoids.    . hydrocortisone (PROCTOSOL HC) 2.5 % rectal cream Place 1 application rectally 2 (two) times daily as needed for hemorrhoids or itching.   . levothyroxine (SYNTHROID, LEVOTHROID) 25 MCG tablet Take 25 mcg by mouth daily before breakfast.   . lisinopril (PRINIVIL,ZESTRIL) 20 MG tablet Take 20 mg by mouth 2 (two) times daily.    . metoprolol (LOPRESSOR) 50 MG tablet Take 50 mg by mouth 2 (two) times daily.     . Multiple Vitamin (MULITIVITAMIN WITH MINERALS) TABS Take 1 tablet by mouth daily.   . nitroGLYCERIN (NITROSTAT) 0.4 MG SL tablet Place 1 tablet (0.4 mg total) under the tongue every 5 (five) minutes as needed for chest pain.   . pantoprazole (PROTONIX) 40 MG tablet Take 40 mg by mouth daily.    . potassium chloride (K-DUR,KLOR-CON) 10 MEQ tablet Take 1 tablet (10 mEq total) by mouth 2 (two) times daily.   Marland Kitchen warfarin (COUMADIN) 5 MG tablet Take 2.5-5 mg by mouth daily. 5mg  on thu, and sun, 2.5 mg all other days 11/17/2014: Takes whole tablet on Thurs and Sun, 1/2 tablet all other days  . zolpidem (AMBIEN) 5 MG tablet Take 1 tablet (5 mg total) by mouth  at bedtime as needed for sleep.    Allergies  Allergen Reactions  . Celebrex [Celecoxib] Diarrhea  . Lipitor [Atorvastatin Calcium] Other (See Comments)    myalgias   ROS:  No fevers, chills, URI symptoms, chest pain, shortness of breath, GI complaints, GU complaints.  +rectal pain as per HPI, weight loss.  +knee/back pain.  Denies bleeding, bruising, rash. +insomnia.  Denies depression.  See HPI.  PHYSICAL EXAM: BP 114/70 mmHg  Pulse 60  Ht 5\' 6"  (1.676 m)  Wt 114 lb (51.71 kg)  BMI 18.41 kg/m2 Somewhat frail, elderly female, in good spirits, in no distress Rectal exam:  Externally appears normal--there is small, noninflamed external hemorrhoid. Anoscopy:  There is inflamed  tissue diffusely, with some dark stool that is heme positive.  No mass is seen or palpable.   ASSESSMENT/PLAN:  Rectal pain - with inflammation noted on anoscopy. refer to GI, especially given wt loss - Plan: Ambulatory referral to Gastroenterology  Loss of weight - continue Ensure daily, frequent snacks - Plan: Ambulatory referral to Gastroenterology  Heme + stool - Plan: Ambulatory referral to Gastroenterology  Insomnia - discussed potential risks of ambien in elderly.  Not recommended to go above 5mg .  I encouraged her to TRY 5mg ; can add melatonin if inadequate response to 5mg   Advised NOT to take more than 5mg  of ambien If not effective, can try it with melatonin, vs changing to a new medication.  Recommended stopping frequent use of laxative. May continue with stool softeners (hold if loose); consider Miralax instead of dulcolax, if ongoing problems with constipation.  Pain med request--this has never been prescribed by me.  Has not been a chronic med, just related to her recent surgeries. She should f/u with doctors prescribing her pain medication.  Discussed that there shouldn't be long-term need for tramadol. Consider use of tylenol arthritis prn  30 min visit, more than 1/2 spent counseling (re insomnia, meds, weight loss, diet, laxative use, pain meds)

## 2014-12-20 NOTE — Patient Instructions (Signed)
Try and use just 5mg  of the ambien--this will likely be effective.  If not, try using it along with melatonin.  We are referring you to gastroenterology for further evaluation of your rectal pain. Continue to drink 1 ensure daily. Okay to continue stool softener daily, but please avoid daily laxative use.  If needed, you can use Miralax as needed.

## 2015-01-01 ENCOUNTER — Other Ambulatory Visit: Payer: Self-pay | Admitting: Family Medicine

## 2015-01-03 ENCOUNTER — Other Ambulatory Visit: Payer: Self-pay | Admitting: *Deleted

## 2015-01-03 ENCOUNTER — Ambulatory Visit (INDEPENDENT_AMBULATORY_CARE_PROVIDER_SITE_OTHER): Payer: Medicare Other

## 2015-01-03 DIAGNOSIS — Z5181 Encounter for therapeutic drug level monitoring: Secondary | ICD-10-CM

## 2015-01-03 DIAGNOSIS — I4891 Unspecified atrial fibrillation: Secondary | ICD-10-CM | POA: Diagnosis not present

## 2015-01-03 LAB — POCT INR: INR: 1.9

## 2015-01-03 MED ORDER — DILTIAZEM HCL ER COATED BEADS 240 MG PO TB24: 240.0000 mg | ORAL_TABLET | Freq: Every day | ORAL | Status: DC

## 2015-01-03 MED ORDER — POTASSIUM CHLORIDE CRYS ER 10 MEQ PO TBCR: 10.0000 meq | EXTENDED_RELEASE_TABLET | Freq: Two times a day (BID) | ORAL | Status: DC

## 2015-01-03 NOTE — Telephone Encounter (Signed)
Is this okay?

## 2015-01-03 NOTE — Telephone Encounter (Signed)
Okay for iron x 1 refill only, given her ongoing GI issues.  She needs to schedule with Dr. Watt Climes

## 2015-01-03 NOTE — Telephone Encounter (Signed)
Refill request for diltiazem ER 240mg , potassium cholor ER [facility]meq [facility]

## 2015-01-05 ENCOUNTER — Encounter: Payer: Self-pay | Admitting: *Deleted

## 2015-01-05 ENCOUNTER — Other Ambulatory Visit: Payer: Self-pay | Admitting: *Deleted

## 2015-01-05 MED ORDER — DILTIAZEM HCL ER COATED BEADS 240 MG PO CP24: 240.0000 mg | ORAL_CAPSULE | Freq: Every day | ORAL | Status: DC

## 2015-01-06 ENCOUNTER — Telehealth: Payer: Self-pay | Admitting: Family Medicine

## 2015-01-06 MED ORDER — POTASSIUM CHLORIDE CRYS ER 10 MEQ PO TBCR: 10.0000 meq | EXTENDED_RELEASE_TABLET | Freq: Two times a day (BID) | ORAL | Status: DC

## 2015-01-06 NOTE — Telephone Encounter (Signed)
Can this wait til appt 1/11?

## 2015-01-06 NOTE — Telephone Encounter (Signed)
Spoke with patient and she does need this refill now, says it will cut it too close being that it is mail order and she really cannot wait until Monday.

## 2015-01-06 NOTE — Telephone Encounter (Signed)
Left message for patient to return my call.

## 2015-01-06 NOTE — Telephone Encounter (Signed)
done

## 2015-01-06 NOTE — Telephone Encounter (Signed)
Pt called for refill of Potassium Chloride 10 mg to [facility]

## 2015-01-10 ENCOUNTER — Ambulatory Visit (INDEPENDENT_AMBULATORY_CARE_PROVIDER_SITE_OTHER): Payer: Medicare Other | Admitting: Family Medicine

## 2015-01-10 ENCOUNTER — Encounter: Payer: Self-pay | Admitting: Family Medicine

## 2015-01-10 VITALS — BP 164/88 | HR 60 | Ht 66.0 in | Wt 113.0 lb

## 2015-01-10 DIAGNOSIS — R634 Abnormal weight loss: Secondary | ICD-10-CM

## 2015-01-10 DIAGNOSIS — E871 Hypo-osmolality and hyponatremia: Secondary | ICD-10-CM

## 2015-01-10 DIAGNOSIS — I119 Hypertensive heart disease without heart failure: Secondary | ICD-10-CM

## 2015-01-10 DIAGNOSIS — E781 Pure hyperglyceridemia: Secondary | ICD-10-CM | POA: Diagnosis not present

## 2015-01-10 LAB — BASIC METABOLIC PANEL
BUN: 25 mg/dL — ABNORMAL HIGH (ref 6–23)
CO2: 26 meq/L (ref 19–32)
Calcium: 10 mg/dL (ref 8.4–10.5)
Chloride: 94 mEq/L — ABNORMAL LOW (ref 96–112)
Creat: 1.07 mg/dL (ref 0.50–1.10)
Glucose, Bld: 82 mg/dL (ref 70–99)
Potassium: 3.6 mEq/L (ref 3.5–5.3)
Sodium: 130 mEq/L — ABNORMAL LOW (ref 135–145)

## 2015-01-10 NOTE — Progress Notes (Addendum)
Chief Complaint  Patient presents with  . Weight Check    1 month follow up on weight.     Patient presents for 4 week f/u on her weight.  She has had 10-12 pound weight loss since 09/2014. She admits that her appetite hasn't been great, but still tries to eat, just maybe not as much. She has been drinking 1/2 Ensure once daily (was told to have 1-2/day) She denies feeling depressed or sad. She doesn't feel as hungry as in the past, but also it takes very little for her to feel full--eating less overall.  She states she gained 5 pounds at Christmas, and is very disappointed that she is down today.  She ate good food, good desserts.  HTN--she hasn't been checking her BP's recently.  She felt a little dizzy yesterday.  Feels fine currently. Denies headache, chest pressure--feels good.  She has been on an iron tablet since 2011 or more. She previously had problems with anemia--she was hospitalized in 2011; h/o GI bleed.  She denies any bleeding. She has been having problems with constipation, and pain in the rectum (see prior visits).  Inflammation was noted on anoscopy.  She has appointment scheduled with Dr. Watt Climes for Thursday of this week.  She is only taking 1 kind of iron supplement, can't recall which (has both ferrex 150 and ferrous sulfate 325 on her med list).  Last saw Dr. Chalmers Cater in June. He reports she is under her care for thyroid nodule and medication. Lab Results  Component Value Date   TSH 0.536 12/13/2014   Last labs were 11/2014.  Na was low at 131   Chemistry      Component Value Date/Time   NA 131* 12/13/2014 1222   K 3.7 12/13/2014 1222   CL 94* 12/13/2014 1222   CO2 28 12/13/2014 1222   BUN 20 12/13/2014 1222   CREATININE 0.86 12/13/2014 1222   CREATININE 0.89 10/25/2014 0957      Component Value Date/Time   CALCIUM 9.5 12/13/2014 1222   ALKPHOS 47 12/13/2014 1222   AST 19 12/13/2014 1222   ALT 15 12/13/2014 1222   BILITOT 0.5 12/13/2014 1222     Lab  Results  Component Value Date   WBC 6.8 12/13/2014   HGB 12.6 12/13/2014   HCT 38.9 12/13/2014   MCV 85.9 12/13/2014   PLT 257 12/13/2014   Lab Results  Component Value Date   IRON 88 12/13/2014   TIBC 411 10/23/2010   FERRITIN 35 12/13/2014   PMH, PSH, SH were reviewed and updated.  Outpatient Encounter Prescriptions as of 01/10/2015  Medication Sig Note  . diltiazem (CARTIA XT) 240 MG 24 hr capsule Take 1 capsule (240 mg total) by mouth daily.   Marland Kitchen FERREX 150 150 MG capsule TAKE ONE CAPSULE BY MOUTH EVERY DAY   . hydrochlorothiazide (HYDRODIURIL) 25 MG tablet Take 12.5 mg by mouth daily.    . hydrocortisone (PROCTOSOL HC) 2.5 % rectal cream Place 1 application rectally 2 (two) times daily as needed for hemorrhoids or itching.   . levothyroxine (SYNTHROID, LEVOTHROID) 25 MCG tablet Take 25 mcg by mouth daily before breakfast.   . lisinopril (PRINIVIL,ZESTRIL) 20 MG tablet Take 20 mg by mouth 2 (two) times daily.    . metoprolol (LOPRESSOR) 50 MG tablet Take 50 mg by mouth 2 (two) times daily.     . Multiple Vitamin (MULITIVITAMIN WITH MINERALS) TABS Take 1 tablet by mouth daily.   . pantoprazole (PROTONIX) 40 MG  tablet Take 40 mg by mouth daily.    . potassium chloride (K-DUR,KLOR-CON) 10 MEQ tablet Take 1 tablet (10 mEq total) by mouth 2 (two) times daily.   . traMADol (ULTRAM) 50 MG tablet Take 50 mg by mouth 2 (two) times daily as needed. for pain 01/10/2015: Dr.Yates  . warfarin (COUMADIN) 5 MG tablet Take 2.5-5 mg by mouth daily. 5mg  on thu, and sun, 2.5 mg all other days 11/17/2014: Takes whole tablet on Thurs and Sun, 1/2 tablet all other days  . zolpidem (AMBIEN) 5 MG tablet Take 1 tablet (5 mg total) by mouth at bedtime as needed for sleep. 01/10/2015: Takes most days.  . hydrocortisone (ANUSOL-HC) 25 MG suppository Place 25 mg rectally 2 (two) times daily as needed for hemorrhoids.    . nitroGLYCERIN (NITROSTAT) 0.4 MG SL tablet Place 1 tablet (0.4 mg total) under the tongue  every 5 (five) minutes as needed for chest pain. (Patient not taking: Reported on 01/10/2015)   . [DISCONTINUED] ferrous sulfate 325 (65 FE) MG tablet Take 325 mg by mouth daily.    ROS:  No fevers, chills, significant URI symptoms--no ear pain, sore throat, vision problems, sinus pain.  No cough, shortness of breath. No chest pain, palpitations. No nausea, vomiting, heartburn.  +constipation, straining, and rectal pain. No depression, anxiety. No bleeding/bruising, rash or significant fatigue.  Some mild early satiety and decreased appetite  PHYSICAL EXAM: BP 180/118 mmHg  Pulse 60  Ht 5\' 6"  (1.676 m)  Wt 113 lb (51.256 kg)  BMI 18.25 kg/m2 164/88 on repeat by MD Elderly, thin female in no distress.  She is in good spirits and appears well HEENT: PERRL, conjunctiva clear.  OP clear.  Neck: Large nodule on right thyroid, nontender. No lymphadenopathy or carotid bruit Heart: regular rate and rhythm, with frequent ectopic beats. Lungs: clear bilaterally Abdomen: soft, nontender, no organomegaly or mass.  Loud bowel sounds heard in room. Extremities: no edema Skin: no rashes Psych: normal mood, affect, hygiene and grooming Neuro: alert and oriented.  Cranial nerves intact, normal gait.  ASSESSMENT/PLAN:  Hyponatremia - due for recheck. Last CXR was prior to wt loss.  Given weight loss and hyponatremia, recheck CXR - Plan: Basic metabolic panel, DG Chest 2 View  Loss of weight - DDx reviewed.  Recent thyroid studies WNL.  Having some early satiety, as well as h/o iron deficiency, and rectal pain, as well as hyponatremia. CXR and GI f/u - Plan: DG Chest 2 View  Benign hypertensive heart disease without heart failure - BP elevated today.  Low sodium diet reviewed.  Monitor BP's at home and f/u if remain elevated. asymptomatic   Discussed the importance of GI f/u as scheduled this week--to eval early satiety and weight loss, as well as her rectal pain (and the irritation seen on anoscopy).    Encouraged increased caloric intake.

## 2015-01-10 NOTE — Patient Instructions (Signed)
Since you feel full quickly, try and eat very frequently throughout the day (at least 5 times/day). Go to [facility] ([address]) for chest x-ray. See Dr. Watt Climes as scheduled--this is to evaluate your rectal pain, as well as weight loss.  Start monitoring your blood pressure at home--it was high in the office today. Return and bring your monitor and list of blood pressures if you blood pressure is consistently over 140/90.  Try and limit the sodium in your food (avoid adding salt, canned foods, processed meat). Avoid drinking too much water (due to the sodium in your blood being low last time--we are rechecking this today).

## 2015-01-13 ENCOUNTER — Encounter: Payer: Self-pay | Admitting: Family Medicine

## 2015-01-13 ENCOUNTER — Ambulatory Visit
Admission: RE | Admit: 2015-01-13 | Discharge: 2015-01-13 | Disposition: A | Discharge status: Home / Self Care | Payer: Medicare Other | Source: Ambulatory Visit | Attending: Family Medicine | Admitting: Family Medicine

## 2015-01-13 DIAGNOSIS — R634 Abnormal weight loss: Secondary | ICD-10-CM | POA: Diagnosis not present

## 2015-01-13 DIAGNOSIS — I700 Atherosclerosis of aorta: Secondary | ICD-10-CM | POA: Insufficient documentation

## 2015-01-13 DIAGNOSIS — E871 Hypo-osmolality and hyponatremia: Secondary | ICD-10-CM

## 2015-01-20 ENCOUNTER — Encounter: Payer: Self-pay | Admitting: Family Medicine

## 2015-01-27 DIAGNOSIS — R634 Abnormal weight loss: Secondary | ICD-10-CM | POA: Diagnosis not present

## 2015-01-27 DIAGNOSIS — K6289 Other specified diseases of anus and rectum: Secondary | ICD-10-CM | POA: Diagnosis not present

## 2015-01-31 ENCOUNTER — Ambulatory Visit (INDEPENDENT_AMBULATORY_CARE_PROVIDER_SITE_OTHER): Payer: Medicare Other | Admitting: *Deleted

## 2015-01-31 DIAGNOSIS — I4891 Unspecified atrial fibrillation: Secondary | ICD-10-CM | POA: Diagnosis not present

## 2015-01-31 DIAGNOSIS — Z5181 Encounter for therapeutic drug level monitoring: Secondary | ICD-10-CM | POA: Diagnosis not present

## 2015-01-31 LAB — POCT INR: INR: 2

## 2015-02-01 DIAGNOSIS — S82012D Displaced osteochondral fracture of left patella, subsequent encounter for closed fracture with routine healing: Secondary | ICD-10-CM | POA: Diagnosis not present

## 2015-02-10 DIAGNOSIS — H40011 Open angle with borderline findings, low risk, right eye: Secondary | ICD-10-CM | POA: Diagnosis not present

## 2015-02-10 DIAGNOSIS — H2513 Age-related nuclear cataract, bilateral: Secondary | ICD-10-CM | POA: Diagnosis not present

## 2015-02-11 ENCOUNTER — Other Ambulatory Visit: Payer: Self-pay | Admitting: Family Medicine

## 2015-02-24 ENCOUNTER — Other Ambulatory Visit: Payer: Self-pay | Admitting: Gastroenterology

## 2015-02-24 DIAGNOSIS — R1032 Left lower quadrant pain: Secondary | ICD-10-CM

## 2015-02-24 DIAGNOSIS — R634 Abnormal weight loss: Secondary | ICD-10-CM | POA: Diagnosis not present

## 2015-02-24 DIAGNOSIS — K6289 Other specified diseases of anus and rectum: Secondary | ICD-10-CM | POA: Diagnosis not present

## 2015-02-28 ENCOUNTER — Telehealth: Payer: Self-pay | Admitting: Cardiology

## 2015-02-28 ENCOUNTER — Ambulatory Visit (INDEPENDENT_AMBULATORY_CARE_PROVIDER_SITE_OTHER): Payer: Medicare Other | Admitting: *Deleted

## 2015-02-28 DIAGNOSIS — I4891 Unspecified atrial fibrillation: Secondary | ICD-10-CM

## 2015-02-28 DIAGNOSIS — Z5181 Encounter for therapeutic drug level monitoring: Secondary | ICD-10-CM | POA: Diagnosis not present

## 2015-02-28 DIAGNOSIS — E785 Hyperlipidemia, unspecified: Secondary | ICD-10-CM

## 2015-02-28 LAB — POCT INR: INR: 1.5

## 2015-02-28 NOTE — Telephone Encounter (Signed)
Patient was seen by her PCP and she inquired about a cholesterol check Per patient was told that her cardiologist should be handling that Will forward to  Dr. Mare Ferrari for review

## 2015-02-28 NOTE — Telephone Encounter (Signed)
New Msg         Pt has concerns about her BP being monitored. Will discuss in detailed when called.     Please return call.

## 2015-03-01 NOTE — Telephone Encounter (Signed)
We will be glad to do that.  Have patient come in for lipid panel hepatic function panel and basal metabolic panel.

## 2015-03-01 NOTE — Telephone Encounter (Signed)
Advised patient and scheduled labs 

## 2015-03-03 ENCOUNTER — Ambulatory Visit
Admission: RE | Admit: 2015-03-03 | Discharge: 2015-03-03 | Disposition: A | Discharge status: Home / Self Care | Payer: Medicare Other | Source: Ambulatory Visit | Attending: Gastroenterology | Admitting: Gastroenterology

## 2015-03-03 ENCOUNTER — Other Ambulatory Visit: Payer: Self-pay | Admitting: Family Medicine

## 2015-03-03 DIAGNOSIS — R1032 Left lower quadrant pain: Secondary | ICD-10-CM

## 2015-03-03 DIAGNOSIS — M4856XA Collapsed vertebra, not elsewhere classified, lumbar region, initial encounter for fracture: Secondary | ICD-10-CM | POA: Diagnosis not present

## 2015-03-03 DIAGNOSIS — K838 Other specified diseases of biliary tract: Secondary | ICD-10-CM | POA: Diagnosis not present

## 2015-03-03 DIAGNOSIS — K769 Liver disease, unspecified: Secondary | ICD-10-CM | POA: Diagnosis not present

## 2015-03-03 MED ORDER — IOPAMIDOL (ISOVUE-300) INJECTION 61%
100.0000 mL | Freq: Once | INTRAVENOUS | Status: AC | PRN
  Administered 2015-03-03: 100 mL via INTRAVENOUS

## 2015-03-03 NOTE — Telephone Encounter (Signed)
Ok for 90

## 2015-03-03 NOTE — Telephone Encounter (Signed)
Is this okay to call in? 

## 2015-03-10 ENCOUNTER — Telehealth: Payer: Self-pay | Admitting: *Deleted

## 2015-03-10 NOTE — Telephone Encounter (Signed)
appt scheduled

## 2015-03-11 DIAGNOSIS — H18411 Arcus senilis, right eye: Secondary | ICD-10-CM | POA: Diagnosis not present

## 2015-03-11 DIAGNOSIS — H18412 Arcus senilis, left eye: Secondary | ICD-10-CM | POA: Diagnosis not present

## 2015-03-11 DIAGNOSIS — H2512 Age-related nuclear cataract, left eye: Secondary | ICD-10-CM | POA: Diagnosis not present

## 2015-03-11 DIAGNOSIS — H2511 Age-related nuclear cataract, right eye: Secondary | ICD-10-CM | POA: Diagnosis not present

## 2015-03-16 ENCOUNTER — Other Ambulatory Visit (INDEPENDENT_AMBULATORY_CARE_PROVIDER_SITE_OTHER): Payer: Medicare Other | Admitting: *Deleted

## 2015-03-16 ENCOUNTER — Ambulatory Visit (INDEPENDENT_AMBULATORY_CARE_PROVIDER_SITE_OTHER): Payer: Medicare Other | Admitting: *Deleted

## 2015-03-16 DIAGNOSIS — E785 Hyperlipidemia, unspecified: Secondary | ICD-10-CM

## 2015-03-16 DIAGNOSIS — I4891 Unspecified atrial fibrillation: Secondary | ICD-10-CM | POA: Diagnosis not present

## 2015-03-16 DIAGNOSIS — Z5181 Encounter for therapeutic drug level monitoring: Secondary | ICD-10-CM | POA: Diagnosis not present

## 2015-03-16 LAB — BASIC METABOLIC PANEL
BUN: 32 mg/dL — ABNORMAL HIGH (ref 6–23)
CO2: 30 mEq/L (ref 19–32)
Calcium: 9.7 mg/dL (ref 8.4–10.5)
Chloride: 96 mEq/L (ref 96–112)
Creatinine, Ser: 1.26 mg/dL — ABNORMAL HIGH (ref 0.40–1.20)
GFR: 43.68 mL/min — AB (ref >60.00)
Glucose, Bld: 81 mg/dL (ref 70–99)
POTASSIUM: 4 meq/L (ref 3.5–5.1)
Sodium: 131 mEq/L — ABNORMAL LOW (ref 135–145)

## 2015-03-16 LAB — LIPID PANEL
CHOLESTEROL: 222 mg/dL — AB (ref 0–200)
HDL: 63.5 mg/dL (ref >39.00)
LDL CALC: 138 mg/dL — AB (ref 0–99)
NonHDL: 158.5
Total CHOL/HDL Ratio: 3
Triglycerides: 105 mg/dL (ref 0.0–149.0)
VLDL: 21 mg/dL (ref 0.0–40.0)

## 2015-03-16 LAB — HEPATIC FUNCTION PANEL
ALT: 15 U/L (ref 0–35)
AST: 23 U/L (ref 0–37)
Albumin: 4.5 g/dL (ref 3.5–5.2)
Alkaline Phosphatase: 62 U/L (ref 39–117)
BILIRUBIN TOTAL: 0.7 mg/dL (ref 0.2–1.2)
Bilirubin, Direct: 0.2 mg/dL (ref 0.0–0.3)
TOTAL PROTEIN: 6.8 g/dL (ref 6.0–8.3)

## 2015-03-16 LAB — POCT INR: INR: 1.9

## 2015-03-17 ENCOUNTER — Telehealth: Payer: Self-pay | Admitting: *Deleted

## 2015-03-17 DIAGNOSIS — E7800 Pure hypercholesterolemia, unspecified: Secondary | ICD-10-CM

## 2015-03-17 MED ORDER — PRAVASTATIN SODIUM 20 MG PO TABS: 20.0000 mg | ORAL_TABLET | Freq: Every evening | ORAL | Status: DC

## 2015-03-17 NOTE — Telephone Encounter (Signed)
-----   Message from Darlin Coco, MD sent at 03/16/2015  9:31 PM EDT ----- LDL too high. Allergic to lipitor.  Try pravachol 20 mg daily. Sodium is low and BUN higher.  Reduce HCTZ to 12.5 mg QOD. Recheck LP and BMET and HFP at next OV

## 2015-03-17 NOTE — Telephone Encounter (Signed)
Advised patient, verbalized understanding  

## 2015-03-23 ENCOUNTER — Encounter: Payer: Self-pay | Admitting: Family Medicine

## 2015-03-23 ENCOUNTER — Ambulatory Visit (INDEPENDENT_AMBULATORY_CARE_PROVIDER_SITE_OTHER): Payer: Medicare Other | Admitting: Family Medicine

## 2015-03-23 VITALS — BP 182/112 | HR 60 | Ht 66.0 in | Wt 124.8 lb

## 2015-03-23 DIAGNOSIS — I10 Essential (primary) hypertension: Secondary | ICD-10-CM

## 2015-03-23 DIAGNOSIS — K6289 Other specified diseases of anus and rectum: Secondary | ICD-10-CM

## 2015-03-23 DIAGNOSIS — R102 Pelvic and perineal pain: Secondary | ICD-10-CM | POA: Diagnosis not present

## 2015-03-23 DIAGNOSIS — G629 Polyneuropathy, unspecified: Secondary | ICD-10-CM

## 2015-03-23 MED ORDER — PREGABALIN 50 MG PO CAPS: 50.0000 mg | ORAL_CAPSULE | Freq: Three times a day (TID) | ORAL | Status: DC

## 2015-03-23 NOTE — Patient Instructions (Addendum)
Please check your blood pressure daily, and contact your cardiologist if it remains high.  It was very high in the office today.   Try and avoid Aleve (if you must take, take it with food, and only after you have taken tylenol without benefit). Follow up with Dr. Lorin Mercy on your back pain as scheduled.  ?if rectovaginal burning could be related to degenerative changes in spine.  We are going to treat for neuropathy since other treatments haven't helped (as suggested by Dr. Watt Climes).  Start taking Lyrica 50mg  twice daily. Increase to three times daily after a few days, if you aren't having any side effects from this.  Call us in 1-2 weeks--we may need to further increase the dose, but I want to make sure that you aren't feeling extremely drowsy or other side effects. If your pain is improved on this dose, then keep at the same low dose.   Call us when running low on samples (about 2 weeks) to let us know how you are doing.

## 2015-03-23 NOTE — Progress Notes (Signed)
Chief Complaint  Patient presents with  . Follow-up    GI follow up.     She saw Dr. Watt Climes on 2/25 for follow up on weight loss and rectal pain.  She had regained 8 pounds from her prior visit there.  She was prescribed Dibucaine ointment, which helps (temporarily) and ordered a CT scan. She continues to have burning discomfort in the vaginal and rectal area. Vagisil is not helpful.  Pain is worse with sitting. She was told to f/u with [place] to consider treatment for neuropathy as cause of her pain, to consider medications such as Lyrica or neurontin.  She is currently in 5/10 discomfort from vaginal and rectal burning.  Miralax has been helping with her bowels (taking since she got CT results).  Helping her keep bowels moving, but still has rectal pressure and discomfort, for which she is needing the topical medications.  CT showed constipation (prominent stool in colon), thickening of gastric and duodenal wall (suspicious for gastritis/duodenitis).  Stable lesion in R hepatic lobe, likely small hemangioma or focal nodular hyperplasia.  There was degenerative findings in the hips, pubic bodies and lumbar spine, with 20% superior endplate compression fracture at L5 with 49m posterior bony retropulsion (chronic, noted on prior MRI).  Atherosclerosis was also noted.  Weight is up almost 12 pounds since her last visit here in January. Her appetite has improved.  She has recently been having some back pain (right sided low back).  She took an Aleve today (despite being told to avoid NSAIDs).  Dr. YLorin Mercypreviously had her on Tramadol, but took her off of it and told her to take Tylenol.  CT showed some inflammation in stomach and duodenum--she denies any abdominal pain.  He last saw Dr. YLorin Mercyearlier this year (January) and has f/u scheduled in May. She has pain that radiates into the right leg, an achey/burning pain.  It is sporadic, not constant.  She also has right hip pain, which is relieved by heat, and  some knee pain.  She recalls taking gabapentin in the past (unsure of dose)--took it about a month, and it didn't help with her pain at all (rx'd by Dr. YLorin Mercy.  Hypertension:  BP today was 182/112.  Pt's machine read 191/117.  Of note, she recently had HCTZ dose decreased by Dr. BMare Ferrarito 1/2 tablet every other day (had been taking 1/2 daily) due to high BUN and low sodium.  She continues to take potassium.  She denies headache, chest pain or any neurologic symptoms.  List of BP's from home show range of BP's from 111/70 up to 157/94.  Last checked 2/27, and she felt tired, so checked it multiple times, all ranging 103-122/60-75.  This was prior to decreasing the HCTZ dose--hasn't checked BP since cutting it to every other day.  PMH, PSH, SH reviewed.  Outpatient Encounter Prescriptions as of 03/23/2015  Medication Sig Note  . dibucaine (NUPERCAINAL) 1 % ointment Apply 1 application topically as needed.  03/23/2015: Received from: External Pharmacy  . diltiazem (CARTIA XT) 240 MG 24 hr capsule Take 1 capsule (240 mg total) by mouth daily.   .Marland KitchenFERREX 150 150 MG capsule TAKE ONE CAPSULE BY MOUTH EVERY DAY   . hydrochlorothiazide (HYDRODIURIL) 25 MG tablet Take 12.5 mg by mouth as directed. 12.5 mg every other day 03/23/2015: Recently decreased to 1/2 tablet every other day by Dr. BMare Ferrari(due to high BUN and low Na)  . levothyroxine (SYNTHROID, LEVOTHROID) 25 MCG tablet Take 25  mcg by mouth daily before breakfast.   . Lidocaine-Hydrocortisone Ace 2-2 % KIT 1 suppository as needed.  03/23/2015: Received from: External Pharmacy  . lisinopril (PRINIVIL,ZESTRIL) 20 MG tablet Take 20 mg by mouth 2 (two) times daily.    . metoprolol (LOPRESSOR) 50 MG tablet Take 50 mg by mouth 2 (two) times daily.     . Multiple Vitamin (MULITIVITAMIN WITH MINERALS) TABS Take 1 tablet by mouth daily.   . pantoprazole (PROTONIX) 40 MG tablet Take 40 mg by mouth daily.    . potassium chloride (K-DUR,KLOR-CON) 10 MEQ  tablet Take 1 tablet (10 mEq total) by mouth 2 (two) times daily.   . pravastatin (PRAVACHOL) 20 MG tablet Take 1 tablet (20 mg total) by mouth every evening. 03/23/2015: Started 3/17  . warfarin (COUMADIN) 5 MG tablet Take 2.5-5 mg by mouth daily. 71m on thu, and sun, 2.5 mg all other days 11/17/2014: Takes whole tablet on Thurs and Sun, 1/2 tablet all other days  . zolpidem (AMBIEN) 5 MG tablet TAKE 1 TABLET BY MOUTH AT BEDTIME AS NEEDED FOR SLEEP 03/23/2015: Takes daily  . BESIVANCE 0.6 % SUSP  03/23/2015: Received from: External Pharmacy  . DUREZOL 0.05 % EMUL  03/23/2015: Received from: External Pharmacy  . ILEVRO 0.3 % SUSP  03/23/2015: Received from: External Pharmacy  . nitroGLYCERIN (NITROSTAT) 0.4 MG SL tablet Place 1 tablet (0.4 mg total) under the tongue every 5 (five) minutes as needed for chest pain. (Patient not taking: Reported on 01/10/2015)   . [DISCONTINUED] hydrochlorothiazide (MICROZIDE) 12.5 MG capsule  03/23/2015: Received from: External Pharmacy  . [DISCONTINUED] hydrocortisone (ANUSOL-HC) 25 MG suppository Place 25 mg rectally 2 (two) times daily as needed for hemorrhoids.    . [DISCONTINUED] hydrocortisone (PROCTOSOL HC) 2.5 % rectal cream Place 1 application rectally 2 (two) times daily as needed for hemorrhoids or itching. (Patient not taking: Reported on 03/23/2015)   . [DISCONTINUED] traMADol (ULTRAM) 50 MG tablet Take 50 mg by mouth 2 (two) times daily as needed. for pain 01/10/2015: Dr.Yates   Miralax--using once daily  Allergies  Allergen Reactions  . Celebrex [Celecoxib] Diarrhea  . Lipitor [Atorvastatin Calcium] Other (See Comments)    myalgias   ROS:  No fevers, chills, URI symptoms, cough, shortness of breath, chest pain, nausea, vomiting, bleeding, bruising, rash.  Appetite is improved, +weight gain. No headaches, dizziness, urinary complaints. +pain as per HPI. Moods are good.  No weakness/numbness  PHYSICAL EXAM: BP 182/112 mmHg  Pulse 60  Ht _0  (1.676 m)   Wt 124 lb 12.8 oz (56.609 kg)  BMI 20.15 kg/m2 184/108 on repeat by MD, RA Pleasant, alert elderly female in no distress.  She doesn't appear to be in any discomfort HEENT: PERRL conjunctiva and sclera are clear. Mucus membranes moist Neck: no lymphadenopathy or mass Heart: regular rate and rhythm. No ectopy or irregular rhythm, no significant murmur Lungs: clear bilaterally Back: no CVA or spinal tenderness, no SI or sciatic notch tenderness. No muscle spasm Extremities: Trace edema at ankles, warm, brisk capillary refill Neuro: alert. Cranial nerves intact.  Normal strength, sensation.  Reflexes-- Hitting R knee made both knees move, she is somewhat jumpy. Slightly diminished DTR at R knee. Normal sensation, negative SLR Psych: normal mood, affect, hygiene and grooming, eye contact and speech . Possibly some mild memory impairment--at times recall was excellent, had some trouble with dates/history  ASSESSMENT/PLAN:  Essential hypertension - very high today; asymptomatic. monitor and call cardiologist if persistently elevated, given recent changes  in meds  Neuropathy - suspected, given lack of pain relief with other treatments.  Prev didn't get relief from gabapentin, but likely dose not enough.  start lyrica - Plan: pregabalin (LYRICA) 50 MG capsule  Vaginal pain - likely has some component of atrophic vaginitis, but seems out of proportion. consider neuropathy  Rectal pain - persists despite treatment of constipation; consider neuropathy per GI. Trial of Lyrica   Try and avoid Aleve (if you must take, take it with food, and only after you have taken tylenol without benefit). Follow up with Dr. Lorin Mercy on your back pain as scheduled.  ?if rectovaginal burning could be related to degenerative changes in spine.  We are going to treat for neuropathy since other treatments haven't helped (as suggested by Dr. Watt Climes).  Start taking Lyrica 52m twice daily. Increase to three times daily after a  few days, if you aren't having any side effects from this.  [place]--we may need to further increase the dose, but I want to make sure that you aren't feeling extremely drowsy or other side effects. If your pain is improved on this dose, then keep at the same low dose.  F/u in 2 weeks to reassess. Given #42 samples

## 2015-03-25 ENCOUNTER — Other Ambulatory Visit: Payer: Self-pay | Admitting: Family Medicine

## 2015-03-28 NOTE — Telephone Encounter (Signed)
Is this okay to refill? 

## 2015-03-30 ENCOUNTER — Emergency Department (HOSPITAL_COMMUNITY)
Admission: EM | Admit: 2015-03-30 | Discharge: 2015-03-30 | Disposition: A | Discharge status: Home / Self Care | Payer: Medicare Other | Attending: Emergency Medicine | Admitting: Emergency Medicine

## 2015-03-30 ENCOUNTER — Encounter (HOSPITAL_COMMUNITY): Payer: Self-pay | Admitting: *Deleted

## 2015-03-30 ENCOUNTER — Ambulatory Visit (INDEPENDENT_AMBULATORY_CARE_PROVIDER_SITE_OTHER): Payer: Medicare Other | Admitting: Surgery

## 2015-03-30 ENCOUNTER — Emergency Department (HOSPITAL_COMMUNITY): Payer: Medicare Other

## 2015-03-30 ENCOUNTER — Ambulatory Visit: Payer: Medicare Other | Admitting: Family Medicine

## 2015-03-30 ENCOUNTER — Telehealth: Payer: Self-pay | Admitting: Family Medicine

## 2015-03-30 DIAGNOSIS — J449 Chronic obstructive pulmonary disease, unspecified: Secondary | ICD-10-CM | POA: Diagnosis not present

## 2015-03-30 DIAGNOSIS — M1990 Unspecified osteoarthritis, unspecified site: Secondary | ICD-10-CM | POA: Insufficient documentation

## 2015-03-30 DIAGNOSIS — Z5181 Encounter for therapeutic drug level monitoring: Secondary | ICD-10-CM | POA: Diagnosis not present

## 2015-03-30 DIAGNOSIS — G4700 Insomnia, unspecified: Secondary | ICD-10-CM | POA: Diagnosis not present

## 2015-03-30 DIAGNOSIS — R471 Dysarthria and anarthria: Secondary | ICD-10-CM | POA: Insufficient documentation

## 2015-03-30 DIAGNOSIS — I480 Paroxysmal atrial fibrillation: Secondary | ICD-10-CM | POA: Diagnosis not present

## 2015-03-30 DIAGNOSIS — R4781 Slurred speech: Secondary | ICD-10-CM

## 2015-03-30 DIAGNOSIS — M810 Age-related osteoporosis without current pathological fracture: Secondary | ICD-10-CM | POA: Diagnosis not present

## 2015-03-30 DIAGNOSIS — Z7901 Long term (current) use of anticoagulants: Secondary | ICD-10-CM | POA: Diagnosis not present

## 2015-03-30 DIAGNOSIS — Z79899 Other long term (current) drug therapy: Secondary | ICD-10-CM | POA: Diagnosis not present

## 2015-03-30 DIAGNOSIS — N189 Chronic kidney disease, unspecified: Secondary | ICD-10-CM | POA: Diagnosis not present

## 2015-03-30 DIAGNOSIS — R479 Unspecified speech disturbances: Secondary | ICD-10-CM

## 2015-03-30 DIAGNOSIS — Z862 Personal history of diseases of the blood and blood-forming organs and certain disorders involving the immune mechanism: Secondary | ICD-10-CM | POA: Diagnosis not present

## 2015-03-30 DIAGNOSIS — R791 Abnormal coagulation profile: Secondary | ICD-10-CM | POA: Insufficient documentation

## 2015-03-30 DIAGNOSIS — I129 Hypertensive chronic kidney disease with stage 1 through stage 4 chronic kidney disease, or unspecified chronic kidney disease: Secondary | ICD-10-CM | POA: Diagnosis not present

## 2015-03-30 DIAGNOSIS — E0590 Thyrotoxicosis, unspecified without thyrotoxic crisis or storm: Secondary | ICD-10-CM | POA: Diagnosis not present

## 2015-03-30 DIAGNOSIS — Z8673 Personal history of transient ischemic attack (TIA), and cerebral infarction without residual deficits: Secondary | ICD-10-CM | POA: Diagnosis not present

## 2015-03-30 DIAGNOSIS — K219 Gastro-esophageal reflux disease without esophagitis: Secondary | ICD-10-CM | POA: Insufficient documentation

## 2015-03-30 DIAGNOSIS — Z87448 Personal history of other diseases of urinary system: Secondary | ICD-10-CM | POA: Diagnosis not present

## 2015-03-30 DIAGNOSIS — I4891 Unspecified atrial fibrillation: Secondary | ICD-10-CM

## 2015-03-30 DIAGNOSIS — E039 Hypothyroidism, unspecified: Secondary | ICD-10-CM | POA: Diagnosis not present

## 2015-03-30 DIAGNOSIS — E780 Pure hypercholesterolemia: Secondary | ICD-10-CM | POA: Diagnosis not present

## 2015-03-30 DIAGNOSIS — R9431 Abnormal electrocardiogram [ECG] [EKG]: Secondary | ICD-10-CM | POA: Diagnosis not present

## 2015-03-30 LAB — I-STAT CHEM 8, ED
BUN: 27 mg/dL — ABNORMAL HIGH (ref 6–23)
CREATININE: 1.1 mg/dL (ref 0.50–1.10)
Calcium, Ion: 1.22 mmol/L (ref 1.13–1.30)
Chloride: 100 mmol/L (ref 96–112)
GLUCOSE: 85 mg/dL (ref 70–99)
HCT: 39 % (ref 36.0–46.0)
Hemoglobin: 13.3 g/dL (ref 12.0–15.0)
POTASSIUM: 4.2 mmol/L (ref 3.5–5.1)
Sodium: 136 mmol/L (ref 135–145)
TCO2: 22 mmol/L (ref 0–100)

## 2015-03-30 LAB — COMPREHENSIVE METABOLIC PANEL
ALBUMIN: 3.8 g/dL (ref 3.5–5.2)
ALT: 17 U/L (ref 0–35)
AST: 24 U/L (ref 0–37)
Alkaline Phosphatase: 65 U/L (ref 39–117)
Anion gap: 10 (ref 5–15)
BUN: 25 mg/dL — ABNORMAL HIGH (ref 6–23)
CALCIUM: 9.8 mg/dL (ref 8.4–10.5)
CO2: 26 mmol/L (ref 19–32)
Chloride: 100 mmol/L (ref 96–112)
Creatinine, Ser: 1.2 mg/dL — ABNORMAL HIGH (ref 0.50–1.10)
GFR calc non Af Amer: 42 mL/min — ABNORMAL LOW (ref >90)
GFR, EST AFRICAN AMERICAN: 49 mL/min — AB (ref >90)
GLUCOSE: 88 mg/dL (ref 70–99)
POTASSIUM: 4.3 mmol/L (ref 3.5–5.1)
Sodium: 136 mmol/L (ref 135–145)
Total Bilirubin: 0.7 mg/dL (ref 0.3–1.2)
Total Protein: 5.9 g/dL — ABNORMAL LOW (ref 6.0–8.3)

## 2015-03-30 LAB — DIFFERENTIAL
BASOS PCT: 1 % (ref 0–1)
Basophils Absolute: 0.1 10*3/uL (ref 0.0–0.1)
EOS ABS: 0.3 10*3/uL (ref 0.0–0.7)
Eosinophils Relative: 4 % (ref 0–5)
LYMPHS ABS: 1.5 10*3/uL (ref 0.7–4.0)
Lymphocytes Relative: 19 % (ref 12–46)
MONO ABS: 0.4 10*3/uL (ref 0.1–1.0)
Monocytes Relative: 5 % (ref 3–12)
NEUTROS ABS: 5.4 10*3/uL (ref 1.7–7.7)
NEUTROS PCT: 71 % (ref 43–77)

## 2015-03-30 LAB — CBC
HEMATOCRIT: 38.1 % (ref 36.0–46.0)
HEMOGLOBIN: 12.7 g/dL (ref 12.0–15.0)
MCH: 31.9 pg (ref 26.0–34.0)
MCHC: 33.3 g/dL (ref 30.0–36.0)
MCV: 95.7 fL (ref 78.0–100.0)
Platelets: 197 10*3/uL (ref 150–400)
RBC: 3.98 MIL/uL (ref 3.87–5.11)
RDW: 13.6 % (ref 11.5–15.5)
WBC: 7.6 10*3/uL (ref 4.0–10.5)

## 2015-03-30 LAB — I-STAT TROPONIN, ED: Troponin i, poc: 0.01 ng/mL (ref 0.00–0.08)

## 2015-03-30 LAB — PROTIME-INR
INR: 1.72 — AB (ref 0.00–1.49)
Prothrombin Time: 20.4 seconds — ABNORMAL HIGH (ref 11.6–15.2)

## 2015-03-30 LAB — ETHANOL: Alcohol, Ethyl (B): <5 mg/dL (ref 0–9)

## 2015-03-30 LAB — POCT INR: INR: 1.7

## 2015-03-30 LAB — APTT: APTT: 49 s — AB (ref 24–37)

## 2015-03-30 NOTE — ED Provider Notes (Addendum)
CSN: [id number]     Arrival date & time 03/30/15  1223 History   First MD Initiated Contact with Patient 03/30/15 1255     Chief Complaint  Patient presents with  . Code Stroke    '@EDPCLEARED'$ @ (Consider location/radiation/quality/duration/timing/severity/associated sxs/prior Treatment) HPI Comments: 80 year old female with atrial fibrillation, embolic stroke, lipids history presents after episode of slurred speech this morning around 9:30 which is resolved since. Patient was normal she woke up this morning. Patient denies any other symptoms at this time no recent infections no head injury. Code stroke was called on arrival. Symptoms resolved with time.  The history is provided by the spouse and the patient.    Past Medical History  Diagnosis Date  . Paroxysmal atrial fibrillation   . History of embolic stroke 04/4649  . Erosive gastritis     with GI bleed thought due to elevated INR and duodenitis  . Hyperthyroidism   . Peripheral edema   . Long-term (current) use of anticoagulants   . HTN (hypertension)   . Stroke   . GERD (gastroesophageal reflux disease)   . Back pain   . COPD (chronic obstructive pulmonary disease)     pt denies on 09/27/14  . H/O hypercholesterolemia     PMH: only  . Arthritis   . Anemia     Iron deficiency anemia  . Hypothyroidism   . Insomnia     prev tried Trazadone (2014); ambien CR caused hangover; on ambien since 11/2013  . Osteoporosis   . Hypothyroidism   . H/O: GI bleed   . Osteoarthritis of both knees   . Primary osteoarthritis of both hips   . Osteoarthritis of lumbar spine   . H/O transfusion of packed red blood cells   . External hemorrhoids without complication   . Pyuria   . Postmenopausal   . Arthropathy   . Blood in stool   . Iron deficiency anemia due to chronic blood loss   . Hemorrhage of rectum and anus   . Chronic kidney disease   . CVA (cerebral vascular accident)    Past Surgical History  Procedure Laterality Date  .  Hemorrhoid surgery    . Operative hysteroscopy    . Abdominal hysterectomy    . Cholecystectomy  03/11/2012    Procedure: LAPAROSCOPIC CHOLECYSTECTOMY WITH INTRAOPERATIVE CHOLANGIOGRAM;  Surgeon: Joyice Faster. Cornett, MD;  Location: [place];  Service: General;  Laterality: N/A;  . Breast enhancement surgery    . Colonoscopy    . Wisdom tooth extraction    . Lumbar laminectomy N/A 09/29/2014    Procedure: L4-5 Decompression, Hemi-Laminectomy,  Removal Free Fragment;  Surgeon: Marybelle Killings, MD;  Location: [place];  Service: Orthopedics;  Laterality: N/A;  . Orif patella Left 10/25/2014    Procedure: OPEN REDUCTION INTERNAL (ORIF) FIXATION LEFT PATELLA;  Surgeon: Marybelle Killings, MD;  Location: [place];  Service: Orthopedics;  Laterality: Left;  . Cholecystectomy    . Cyst excision  11/2014    left chest wall--nodular hidroadenoma   Family History  Problem Relation Age of Onset  . Stroke Mother   . Dementia Father   . Hypertension    . Arthritis    . Cancer Brother     lung   History  Substance Use Topics  . Smoking status: Former Smoker    Types: Cigarettes    Quit date: 09/30/2010  . Smokeless tobacco: Never Used  . Alcohol Use: No   OB History    No  data available     Review of Systems  Constitutional: Negative for fever and chills.  HENT: Negative for congestion.   Eyes: Negative for visual disturbance.  Respiratory: Negative for shortness of breath.   Cardiovascular: Negative for chest pain.  Gastrointestinal: Negative for vomiting and abdominal pain.  Genitourinary: Negative for dysuria and flank pain.  Musculoskeletal: Negative for back pain, neck pain and neck stiffness.  Skin: Negative for rash.  Neurological: Positive for speech difficulty. Negative for syncope, weakness, light-headedness and headaches.      Allergies  Celebrex and Lipitor  Home Medications   Prior to Admission medications   Medication Sig Start Date End Date Taking? Authorizing Provider  calcium  carbonate (TUMS - DOSED IN MG ELEMENTAL CALCIUM) 500 MG chewable tablet Chew 1 tablet by mouth 2 (two) times daily.   Yes Historical Provider, MD  dibucaine (NUPERCAINAL) 1 % ointment Apply 1 application topically as needed.  03/07/15  Yes Historical Provider, MD  diltiazem (CARTIA XT) 240 MG 24 hr capsule Take 1 capsule (240 mg total) by mouth daily. 01/05/15  Yes Rita Ohara, MD  FERREX 150 150 MG capsule TAKE ONE CAPSULE BY MOUTH EVERY DAY 03/28/15  Yes Rita Ohara, MD  hydrochlorothiazide (HYDRODIURIL) 25 MG tablet Take 12.5 mg by mouth as directed. 12.5 mg every other day 09/02/12  Yes Historical Provider, MD  ketotifen (ZADITOR) 0.025 % ophthalmic solution Place 1 drop into both eyes daily as needed (dry eyes).   Yes Historical Provider, MD  levothyroxine (SYNTHROID, LEVOTHROID) 25 MCG tablet Take 25 mcg by mouth daily before breakfast.   Yes Historical Provider, MD  Lidocaine-Hydrocortisone Ace 2-2 % KIT Place 1 suppository rectally as needed (itching).  03/11/15  Yes Historical Provider, MD  lisinopril (PRINIVIL,ZESTRIL) 20 MG tablet Take 20 mg by mouth 2 (two) times daily.  05/01/13  Yes Darlin Coco, MD  Magnesium 500 MG TABS Take 1 tablet by mouth daily.   Yes Historical Provider, MD  metoprolol (LOPRESSOR) 50 MG tablet Take 50 mg by mouth 2 (two) times daily.     Yes Historical Provider, MD  Multiple Vitamin (MULITIVITAMIN WITH MINERALS) TABS Take 1 tablet by mouth daily.   Yes Historical Provider, MD  pantoprazole (PROTONIX) 40 MG tablet Take 40 mg by mouth daily.    Yes Historical Provider, MD  potassium chloride (K-DUR,KLOR-CON) 10 MEQ tablet Take 1 tablet (10 mEq total) by mouth 2 (two) times daily. 01/06/15  Yes Rita Ohara, MD  pravastatin (PRAVACHOL) 20 MG tablet Take 1 tablet (20 mg total) by mouth every evening. 03/17/15  Yes Darlin Coco, MD  pregabalin (LYRICA) 50 MG capsule Take 1 capsule (50 mg total) by mouth 3 (three) times daily. 03/23/15  Yes Rita Ohara, MD  warfarin (COUMADIN) 5 MG  tablet Take 2.5-5 mg by mouth daily at 6 PM. $Rem'5mg'keeo$  on thu, and sun, 2.5 mg all other days   Yes Historical Provider, MD  zolpidem (AMBIEN) 5 MG tablet TAKE 1 TABLET BY MOUTH AT BEDTIME AS NEEDED FOR SLEEP 03/03/15  Yes Rita Ohara, MD  BESIVANCE 0.6 % SUSP  03/11/15   Historical Provider, MD  DUREZOL 0.05 % EMUL  03/14/15   Historical Provider, MD  ILEVRO 0.3 % SUSP  03/14/15   Historical Provider, MD  nitroGLYCERIN (NITROSTAT) 0.4 MG SL tablet Place 1 tablet (0.4 mg total) under the tongue every 5 (five) minutes as needed for chest pain. Patient not taking: Reported on 01/10/2015 05/01/13   Darlin Coco, MD   BP  158/80 mmHg  Pulse 48  Temp(Src) 97.5 F (36.4 C) (Oral)  Resp 20  Ht 5' 6" (1.676 m)  Wt 124 lb (56.246 kg)  BMI 20.02 kg/m2  SpO2 95% Physical Exam  Constitutional: She is oriented to person, place, and time. She appears well-developed and well-nourished.  HENT:  Head: Normocephalic and atraumatic.  Eyes: Conjunctivae are normal. Right eye exhibits no discharge. Left eye exhibits no discharge.  Neck: Normal range of motion. Neck supple. No tracheal deviation present.  Cardiovascular: An irregularly irregular rhythm present. Bradycardia present.   Pulmonary/Chest: Effort normal and breath sounds normal.  Abdominal: Soft. She exhibits no distension. There is no tenderness. There is no guarding.  Musculoskeletal: She exhibits no edema.  Neurological: She is alert and oriented to person, place, and time. GCS eye subscore is 4. GCS verbal subscore is 5. GCS motor subscore is 6.  5+ strength in UE and LE with f/e at major joints. Sensation to palpation intact in UE and LE. CNs 2-12 grossly intact.  EOMFI.  PERRL.   Finger nose and coordination intact bilateral.   Visual fields intact to finger testing.   Skin: Skin is warm. No rash noted.  Psychiatric: She has a normal mood and affect.  Nursing note and vitals reviewed.   ED Course  Procedures (including critical care time) Labs  Review Labs Reviewed  PROTIME-INR - Abnormal; Notable for the following:    Prothrombin Time 20.4 (*)    INR 1.72 (*)    All other components within normal limits  APTT - Abnormal; Notable for the following:    aPTT 49 (*)    All other components within normal limits  COMPREHENSIVE METABOLIC PANEL - Abnormal; Notable for the following:    BUN 25 (*)    Creatinine, Ser 1.20 (*)    Total Protein 5.9 (*)    GFR calc non Af Amer 42 (*)    GFR calc Af Amer 49 (*)    All other components within normal limits  I-STAT CHEM 8, ED - Abnormal; Notable for the following:    BUN 27 (*)    All other components within normal limits  ETHANOL  CBC  DIFFERENTIAL  URINE RAPID DRUG SCREEN (HOSP PERFORMED)  URINALYSIS, ROUTINE W REFLEX MICROSCOPIC  I-STAT TROPOININ, ED  I-STAT TROPOININ, ED    Imaging Review Ct Head Wo Contrast  03/30/2015   CLINICAL DATA:  Slurred speech without extremity weakness  EXAM: CT HEAD WITHOUT CONTRAST  TECHNIQUE: Contiguous axial images were obtained from the base of the skull through the vertex without intravenous contrast.  COMPARISON:  07/10/2010  FINDINGS: Skull and Sinuses:Negative for fracture or destructive process. The mastoids, middle ears, and imaged paranasal sinuses are clear.  Orbits: No acute abnormality.  Brain: No evidence of acute infarction, hemorrhage, hydrocephalus, or mass lesion/mass effect.ASPECTS is 10. Prominent density of the proximal left M1 is isodense to the basilar tip and unchanged from previous. There is chronic small vessel disease with diffuse ischemic gliosis throughout the bilateral cerebral white matter, pattern stable from prior. Cerebral volume is within normal limits for age.  Critical Value/emergent results were called by telephone at the time of interpretation on 03/30/2015 at 12:54 pm to Dr. Elnora Morrison , who verbally acknowledged these results.  IMPRESSION: 1. No intracranial hemorrhage or visible infarct. 2. Moderate chronic small  vessel disease.   Electronically Signed   By: Monte Fantasia M.D.   On: 03/30/2015 12:55   Mr Brain Wo Contrast  03/30/2015   CLINICAL DATA:  80 year old female with dizziness for 1 week. Sudden onset slurred speech this morning. Initial encounter.  EXAM: MRI HEAD WITHOUT CONTRAST  TECHNIQUE: Multiplanar, multiecho pulse sequences of the brain and surrounding structures were obtained without intravenous contrast.  COMPARISON:  Brain MRI 07/10/2010.  FINDINGS: Major intracranial vascular flow voids are stable. Cerebral volume is mildly decreased since 2011. No restricted diffusion to suggest acute infarction. No midline shift, mass effect, evidence of mass lesion, ventriculomegaly, extra-axial collection or acute intracranial hemorrhage. Cervicomedullary junction and pituitary are within normal limits.  Patchy and confluent cerebral white matter T2 and FLAIR hyperintensity has mildly progressed since 2011. Some of these areas most resemble chronic lacunar infarcts. Similar T2 heterogeneity in the deep gray matter nuclei is stable. Patchy T2 hyperintensity in the pons has increased. Questionable tiny lacunar infarcts in the right cerebellar hemisphere on series 6, image 5. No cortical encephalomalacia or chronic blood products identified in the brain.  Visible internal auditory structures appear normal. Mastoids are clear. Negative paranasal sinuses. Visualized orbit soft tissues are within normal limits. Visualized scalp soft tissues are within normal limits. Bone marrow signal is within normal limits. Degenerative changes in the cervical spine appear increased.  IMPRESSION: 1.  No acute intracranial abnormality. 2. Chronic signal abnormality in the brain most suggestive of advanced chronic small vessel disease, with mild progression since 2011.   Electronically Signed   By: Genevie Ann M.D.   On: 03/30/2015 14:58     EKG Interpretation   Date/Time:  Wednesday March 30 2015 12:48:16 EDT Ventricular Rate:   52 PR Interval:  213 QRS Duration: 88 QT Interval:  452 QTC Calculation: 420 R Axis:   77 Text Interpretation:  Sinus rhythm Borderline prolonged PR interval  Consider left atrial enlargement overall similar to previous, V3 improved  Confirmed by Viridiana Spaid  MD, Dwayna Kentner ([id number]) on 03/30/2015 12:58:08 PM      MDM   Final diagnoses:  Subtherapeutic international normalized ratio (INR)  Dysarthria   Patient with known stroke history on Coumadin for atrial fibrillation/stroke presents with strokelike symptoms since this morning that resolved prior to my exam. Patient has close follow-up outpatient, workup in the ER by myself and neurology at bedside did not reveal any acute findings, MRI no acute findings. On recheck patient well-appearing and prefers and is comfortable with close outpatient follow-up. Discussed increasing/additional Coumadin dose and recheck of INR in a week.  Results and differential diagnosis were discussed with the patient/parent/guardian. Close follow up outpatient was discussed, comfortable with the plan.   Medications - No data to display  Filed Vitals:   03/30/15 1315 03/30/15 1330 03/30/15 1345 03/30/15 1454  BP: 156/88 156/86 146/82 158/80  Pulse: 51 52 51 48  Temp:      TempSrc:      Resp: _0 Height:      Weight:      SpO2: 99% 99% 99% 95%    Final diagnoses:  Subtherapeutic international normalized ratio (INR)  Dysarthria        Elnora Morrison, MD 03/30/15 1541  Elnora Morrison, MD 03/30/15 [phone]

## 2015-03-30 NOTE — Telephone Encounter (Signed)
Pt's husband called stating that pt was at coumadin clinic for an appt and he recvd a call stating that he should pick pt up and get pt to Dr Tomi Bamberger because pt has slurred speech and dizziness. Husband stated he is leaving home now to pick pt up to bring he in to our office. Discussed this with Dr Tomi Bamberger & she would like for pt to go to hospital instead. Tried to contact husband but could not reach him so called coumadin clinic and asked them to let pt & husband know to go to hospital instead of our office. Pt still came to our office. She said she did not know about going to the hospital and that she could probably take Allegra and be fine. Advised pt that Dr Tomi Bamberger would like for her to go to the hospital for further evaluation were more testing could be performed and Dr Tomi Bamberger would get the records for review. Pt's husband was trying to convince pt of this as they were leaving our office as well

## 2015-03-30 NOTE — Discharge Instructions (Signed)
Take an extra Coumadin dose as discussed. Have your INR rechecked in the next week. Return for stroke symptoms or new concerns.  If you were given medicines take as directed.  If you are on coumadin or contraceptives realize their levels and effectiveness is altered by many different medicines.  If you have any reaction (rash, tongues swelling, other) to the medicines stop taking and see a physician.   Please follow up as directed and return to the ER or see a physician for new or worsening symptoms.  Thank you. Filed Vitals:   03/30/15 1315 03/30/15 1330 03/30/15 1345 03/30/15 1454  BP: 156/88 156/86 146/82 158/80  Pulse: 51 52 51 48  Temp:      TempSrc:      Resp: 13 15 20    Height:      Weight:      SpO2: 99% 99% 99% 95%

## 2015-03-30 NOTE — Consult Note (Signed)
Referring Physician: Reather Converse    Chief Complaint: Slurred speech  HPI: Lisa Lucero is an 80 y.o. female on Coumadin for PAF, who was at the Coumadin clinic this morning and was noted to have slurred speech.  Her husband transported her from there to her PCP since he had been advised that she needed to be seen today.  From her PCP she was referred to the ED.  Code stroke was called in the ED.  No other neurological complaints are noted. Lyrica has been started within the past week.      Date last known well: Date: 03/30/2015 Time last known well: Time: 09:30 tPA Given: No: On Coumadin  Past Medical History  Diagnosis Date  . Paroxysmal atrial fibrillation   . History of embolic stroke 04/9562  . Erosive gastritis     with GI bleed thought due to elevated INR and duodenitis  . Hyperthyroidism   . Peripheral edema   . Long-term (current) use of anticoagulants   . HTN (hypertension)   . Stroke   . GERD (gastroesophageal reflux disease)   . Back pain   . COPD (chronic obstructive pulmonary disease)     pt denies on 09/27/14  . H/O hypercholesterolemia     PMH: only  . Arthritis   . Anemia     Iron deficiency anemia  . Hypothyroidism   . Insomnia     prev tried Trazadone (2014); ambien CR caused hangover; on ambien since 11/2013  . Osteoporosis   . Hypothyroidism   . H/O: GI bleed   . Osteoarthritis of both knees   . Primary osteoarthritis of both hips   . Osteoarthritis of lumbar spine   . H/O transfusion of packed red blood cells   . External hemorrhoids without complication   . Pyuria   . Postmenopausal   . Arthropathy   . Blood in stool   . Iron deficiency anemia due to chronic blood loss   . Hemorrhage of rectum and anus   . Chronic kidney disease   . CVA (cerebral vascular accident)     Past Surgical History  Procedure Laterality Date  . Hemorrhoid surgery    . Operative hysteroscopy    . Abdominal hysterectomy    . Cholecystectomy  03/11/2012    Procedure:  LAPAROSCOPIC CHOLECYSTECTOMY WITH INTRAOPERATIVE CHOLANGIOGRAM;  Surgeon: Joyice Faster. Cornett, MD;  Location: [facility];  Service: General;  Laterality: N/A;  . Breast enhancement surgery    . Colonoscopy    . Wisdom tooth extraction    . Lumbar laminectomy N/A 09/29/2014    Procedure: L4-5 Decompression, Hemi-Laminectomy,  Removal Free Fragment;  Surgeon: Marybelle Killings, MD;  Location: [facility];  Service: Orthopedics;  Laterality: N/A;  . Orif patella Left 10/25/2014    Procedure: OPEN REDUCTION INTERNAL (ORIF) FIXATION LEFT PATELLA;  Surgeon: Marybelle Killings, MD;  Location: [facility];  Service: Orthopedics;  Laterality: Left;  . Cholecystectomy    . Cyst excision  11/2014    left chest wall--nodular hidroadenoma    Family History  Problem Relation Age of Onset  . Stroke Mother   . Dementia Father   . Hypertension    . Arthritis    . Cancer Brother     lung   Social History:  reports that she quit smoking about 4 years ago. Her smoking use included Cigarettes. She has never used smokeless tobacco. She reports that she does not drink alcohol or use illicit drugs.  Allergies:  Allergies  Allergen Reactions  . Celebrex [Celecoxib] Diarrhea  . Lipitor [Atorvastatin Calcium] Other (See Comments)    myalgias    Medications: I have reviewed the patient's current medications. Prior to Admission:  Current outpatient prescriptions:  .  calcium carbonate (TUMS - DOSED IN MG ELEMENTAL CALCIUM) 500 MG chewable tablet, Chew 1 tablet by mouth 2 (two) times daily., Disp: , Rfl:  .  dibucaine (NUPERCAINAL) 1 % ointment, Apply 1 application topically as needed. , Disp: , Rfl: 3 .  diltiazem (CARTIA XT) 240 MG 24 hr capsule, Take 1 capsule (240 mg total) by mouth daily., Disp: 90 capsule, Rfl: 0 .  FERREX 150 150 MG capsule, TAKE ONE CAPSULE BY MOUTH EVERY DAY, Disp: 30 capsule, Rfl: 2 .  hydrochlorothiazide (HYDRODIURIL) 25 MG tablet, Take 12.5 mg by mouth as directed. 12.5 mg every other day, Disp: , Rfl:  .   ketotifen (ZADITOR) 0.025 % ophthalmic solution, Place 1 drop into both eyes daily as needed (dry eyes)., Disp: , Rfl:  .  levothyroxine (SYNTHROID, LEVOTHROID) 25 MCG tablet, Take 25 mcg by mouth daily before breakfast., Disp: , Rfl:  .  Lidocaine-Hydrocortisone Ace 2-2 % KIT, Place 1 suppository rectally as needed (itching). , Disp: , Rfl: 0 .  lisinopril (PRINIVIL,ZESTRIL) 20 MG tablet, Take 20 mg by mouth 2 (two) times daily. , Disp: , Rfl:  .  Magnesium 500 MG TABS, Take 1 tablet by mouth daily., Disp: , Rfl:  .  metoprolol (LOPRESSOR) 50 MG tablet, Take 50 mg by mouth 2 (two) times daily.  , Disp: , Rfl:  .  Multiple Vitamin (MULITIVITAMIN WITH MINERALS) TABS, Take 1 tablet by mouth daily., Disp: , Rfl:  .  pantoprazole (PROTONIX) 40 MG tablet, Take 40 mg by mouth daily. , Disp: , Rfl:  .  potassium chloride (K-DUR,KLOR-CON) 10 MEQ tablet, Take 1 tablet (10 mEq total) by mouth 2 (two) times daily., Disp: 180 tablet, Rfl: 1 .  pravastatin (PRAVACHOL) 20 MG tablet, Take 1 tablet (20 mg total) by mouth every evening., Disp: 30 tablet, Rfl: 5 .  pregabalin (LYRICA) 50 MG capsule, Take 1 capsule (50 mg total) by mouth 3 (three) times daily., Disp: 42 capsule, Rfl: 0 .  warfarin (COUMADIN) 5 MG tablet, Take 2.5-5 mg by mouth daily at 6 PM. $Rem'5mg'XIkF$  on thu, and sun, 2.5 mg all other days, Disp: , Rfl:  .  zolpidem (AMBIEN) 5 MG tablet, TAKE 1 TABLET BY MOUTH AT BEDTIME AS NEEDED FOR SLEEP, Disp: 90 tablet, Rfl: 0 .  BESIVANCE 0.6 % SUSP, , Disp: , Rfl: 1 .  DUREZOL 0.05 % EMUL, , Disp: , Rfl: 1 .  ILEVRO 0.3 % SUSP, , Disp: , Rfl: 1 .  nitroGLYCERIN (NITROSTAT) 0.4 MG SL tablet, Place 1 tablet (0.4 mg total) under the tongue every 5 (five) minutes as needed for chest pain. (Patient not taking: Reported on 01/10/2015), Disp: 25 tablet, Rfl: PRN .  [DISCONTINUED] diphenhydrAMINE (BENADRYL) 25 MG tablet, Take 25 mg by mouth every 6 (six) hours as needed.  , Disp: , Rfl:  .  [DISCONTINUED] IRON PO, Take 1  tablet by mouth daily. , Disp: , Rfl:  .  [DISCONTINUED] potassium chloride (K-DUR) 10 MEQ tablet, Take 1 tablet (10 mEq total) by mouth 2 (two) times daily., Disp: 90 tablet, Rfl: 0  ROS: History obtained from the patient  General ROS: negative for - chills, fatigue, fever, night sweats, weight gain or weight loss Psychological ROS: negative for - behavioral disorder,  hallucinations, memory difficulties, mood swings or suicidal ideation Ophthalmic ROS: negative for - blurry vision, double vision, eye pain or loss of vision ENT ROS: negative for - epistaxis, nasal discharge, oral lesions, sore throat, tinnitus or vertigo Allergy and Immunology ROS: negative for - hives or itchy/watery eyes Hematological and Lymphatic ROS: negative for - bleeding problems, bruising or swollen lymph nodes Endocrine ROS: negative for - galactorrhea, hair pattern changes, polydipsia/polyuria or temperature intolerance Respiratory ROS: negative for - cough, hemoptysis, shortness of breath or wheezing Cardiovascular ROS: negative for - chest pain, dyspnea on exertion, edema or irregular heartbeat Gastrointestinal ROS: mild abdominal pain Genito-Urinary ROS: negative for - dysuria, hematuria, incontinence or urinary frequency/urgency Musculoskeletal ROS: negative for - joint swelling or muscular weakness Neurological ROS: as noted in HPI Dermatological ROS: negative for rash and skin lesion changes  Physical Examination: Blood pressure 176/95, pulse 54, temperature 97.5 F (36.4 C), temperature source Oral, resp. rate 13, height $RemoveBe'5\' 6"'GDgsKPWhy$  (1.676 m), weight 56.246 kg (124 lb), SpO2 98 %.  HEENT-  Normocephalic, no lesions, without obvious abnormality.  Normal external eye and conjunctiva.  Normal TM's bilaterally.  Normal auditory canals and external ears. Normal external nose, mucus membranes and septum.  Normal pharynx. Cardiovascular- S1, S2 normal, pulses palpable throughout   Lungs- chest clear, no wheezing,  rales, normal symmetric air entry Abdomen- soft, mild tenderness to palpation Extremities- no edema Lymph-no adenopathy palpable Musculoskeletal-no joint tenderness, deformity or swelling Skin-warm and dry, no hyperpigmentation, vitiligo, or suspicious lesions  Neurological Examination Mental Status: Alert, oriented, thought content appropriate.  Speech fluent without evidence of aphasia with some mild dysarthtria.  Able to follow 3 step commands without difficulty. Cranial Nerves: II: Discs flat bilaterally; Visual fields grossly normal, pupils equal, round, reactive to light and accommodation III,IV, VI: ptosis not present, extra-ocular motions intact bilaterally V,VII: smile symmetric, facial light touch sensation normal bilaterally VIII: hearing normal bilaterally IX,X: gag reflex present XI: bilateral shoulder shrug XII: midline tongue extension Motor: Right : Upper extremity   5/5    Left:     Upper extremity   5/5  Lower extremity   5/5     Lower extremity   5/5 Tone and bulk:normal tone throughout; no atrophy noted Sensory: Pinprick and light touch intact throughout, bilaterally Deep Tendon Reflexes: 2+ and symmetric with absent AJ's bilaterally Plantars: Right: downgoing   Left: downgoing Cerebellar: normal finger-to-nose and normal heel-to-shin testing bilaterally   Laboratory Studies:  Basic Metabolic Panel:  Recent Labs Lab 03/30/15 1246 03/30/15 1252  NA 136 136  K 4.3 4.2  CL 100 100  CO2 26  --   GLUCOSE 88 85  BUN 25* 27*  CREATININE 1.20* 1.10  CALCIUM 9.8  --     Liver Function Tests:  Recent Labs Lab 03/30/15 1246  AST 24  ALT 17  ALKPHOS 65  BILITOT 0.7  PROT 5.9*  ALBUMIN 3.8   No results for input(s): LIPASE, AMYLASE in the last 168 hours. No results for input(s): AMMONIA in the last 168 hours.  CBC:  Recent Labs Lab 03/30/15 1246 03/30/15 1252  WBC 7.6  --   NEUTROABS 5.4  --   HGB 12.7 13.3  HCT 38.1 39.0  MCV 95.7  --    PLT 197  --     Cardiac Enzymes: No results for input(s): CKTOTAL, CKMB, CKMBINDEX, TROPONINI in the last 168 hours.  BNP: Invalid input(s): POCBNP  CBG: No results for input(s): GLUCAP in the last 168 hours.  Microbiology: Results for orders placed or performed during the hospital encounter of 09/27/14  Surgical pcr screen     Status: None   Collection Time: 09/27/14  3:08 PM  Result Value Ref Range Status   MRSA, PCR NEGATIVE NEGATIVE Final   Staphylococcus aureus NEGATIVE NEGATIVE Final    Comment:        The Xpert SA Assay (FDA approved for NASAL specimens in patients over 40 years of age), is one component of a comprehensive surveillance program.  Test performance has been validated by [employer] for patients greater than or equal to 76 year old. It is not intended to diagnose infection nor to guide or monitor treatment.    Coagulation Studies:  Recent Labs  03/30/15 1122 03/30/15 1246  LABPROT  --  20.4*  INR 1.7 1.72*    Urinalysis: No results for input(s): COLORURINE, LABSPEC, PHURINE, GLUCOSEU, HGBUR, BILIRUBINUR, KETONESUR, PROTEINUR, UROBILINOGEN, NITRITE, LEUKOCYTESUR in the last 168 hours.  Invalid input(s): APPERANCEUR  Lipid Panel:    Component Value Date/Time   CHOL 222* 03/16/2015 1153   TRIG 105.0 03/16/2015 1153   HDL 63.50 03/16/2015 1153   CHOLHDL 3 03/16/2015 1153   VLDL 21.0 03/16/2015 1153   LDLCALC 138* 03/16/2015 1153    HgbA1C:  Lab Results  Component Value Date   HGBA1C * 07/10/2010    5.8 (NOTE)                                                                       According to the ADA Clinical Practice Recommendations for 2011, when HbA1c is used as a screening test:   >=6.5%   Diagnostic of Diabetes Mellitus           (if abnormal result  is confirmed)  5.7-6.4%   Increased risk of developing Diabetes Mellitus  References:Diagnosis and Classification of Diabetes Mellitus,Diabetes JMEQ,6834,19(QQIWL 1):S62-S69 and  Standards of Medical Care in         Diabetes - 2011,Diabetes Care,2011,34  (Suppl 1):S11-S61.    Urine Drug Screen:  No results found for: LABOPIA, COCAINSCRNUR, LABBENZ, AMPHETMU, THCU, LABBARB  Alcohol Level:  Recent Labs Lab 03/30/15 [place] <5    Other results: EKG: sinus rhythm at 52 bpm.  Imaging: Ct Head Wo Contrast  03/30/2015   CLINICAL DATA:  Slurred speech without extremity weakness  EXAM: CT HEAD WITHOUT CONTRAST  TECHNIQUE: Contiguous axial images were obtained from the base of the skull through the vertex without intravenous contrast.  COMPARISON:  07/10/2010  FINDINGS: Skull and Sinuses:Negative for fracture or destructive process. The mastoids, middle ears, and imaged paranasal sinuses are clear.  Orbits: No acute abnormality.  Brain: No evidence of acute infarction, hemorrhage, hydrocephalus, or mass lesion/mass effect.ASPECTS is 10. Prominent density of the proximal left M1 is isodense to the basilar tip and unchanged from previous. There is chronic small vessel disease with diffuse ischemic gliosis throughout the bilateral cerebral white matter, pattern stable from prior. Cerebral volume is within normal limits for age.  Critical Value/emergent results were called by telephone at the time of interpretation on 03/30/2015 at 12:54 pm to Dr. Elnora Morrison , who verbally acknowledged these results.  IMPRESSION: 1. No intracranial hemorrhage or visible infarct. 2. Moderate chronic  small vessel disease.   Electronically Signed   By: Monte Fantasia M.D.   On: 03/30/2015 12:55    Assessment: 80 y.o. female on Coumadin for PAF who presents with acute onset slurred speech.  Remaining neurological examination is unremarkable.  INR 1.72.  Head CT personally reviewed and shows no acute changes.  Although a small infarct is in the differential with the INR being subtherapeutic, due to patient being chronically managed on Coumadin do not feel that extensive inpatient work up required.     Stroke Risk Factors - atrial fibrillation  Plan: 1. MRI of the brain without contrast 2. Therapeutic INR to be achieved (2-3).     Case discussed with Dr. Theodosia Quay, MD [facility] [phone] 03/30/2015, 1:30 PM  Addendum: MRI of the brain personally reviewed and shows no acute changes.  Instructions given for Coumadin to address low INR.  Agree with d/c  And follow up on an outpatient basis.  Symptoms may be secondary to Lyrica and may need to consider adjustment.  PCP to address.    Alexis Goodell, MD [facility] [phone]

## 2015-03-30 NOTE — ED Notes (Signed)
Pt arrived POV with husband.  LSN by husband at 0930.  Pt drove herself to the coumadin clinic where she was noted to have slurred speech.  Husband took her first to PCP, then to ED where code stroke was activated.  Pt to CT with RN and Zoll, RR meet pt in CT.  Pt returned to exam room and was seen by Dr Doy Mince.  NIHSS 1 for slurred speech, no other deficits noted.  No TPA, pt to have MRI, disposition based on results per Dr Doy Mince.

## 2015-04-04 ENCOUNTER — Ambulatory Visit (INDEPENDENT_AMBULATORY_CARE_PROVIDER_SITE_OTHER): Payer: Medicare Other | Admitting: Family Medicine

## 2015-04-04 ENCOUNTER — Encounter: Payer: Self-pay | Admitting: Family Medicine

## 2015-04-04 VITALS — BP 162/98 | HR 56 | Ht 66.0 in | Wt 128.2 lb

## 2015-04-04 DIAGNOSIS — G629 Polyneuropathy, unspecified: Secondary | ICD-10-CM | POA: Diagnosis not present

## 2015-04-04 DIAGNOSIS — I10 Essential (primary) hypertension: Secondary | ICD-10-CM | POA: Diagnosis not present

## 2015-04-04 NOTE — Patient Instructions (Signed)
  Continue to hold off on taking the Lyrica for now--restart it at just 50mg  at bedtime only if/when your pain recurs.    If you still cannot tolerate this low dose (side effects outweigh the benefit), then we might want to retry the gabapentin, but make sure to titrate the dose up until you get some benefit (hopefully fewer side effects). Cymbalta at lose dose could be another option.  Continue to monitor your blood pressure at home--it is high here today.  At some point, you should bring your machine to a visit with you, so that we can make sure your monitor is accurate.

## 2015-04-04 NOTE — Progress Notes (Signed)
Chief Complaint  Patient presents with  . Follow-up    ER follow up. Is not taking Lyrica as it is making her diizy, did take up until yesterday. Rectal/vaginal pain is under control.   Patient was seen in the ER on 3/30. She had developed slurred speech. It started earlier that morning, prior to her visit at the coumadin clinic.  protime was subtherapeutic (pt with afib).  Head CT and MRI did not show any acute findings. She had a neuro consult. Coumadin dose was adjusted by clinic and she has f/u with them. It was felt that possibly lyrica could have contributed to her symptoms. ER notes, labs and imaging were reviewed.  She had been started on $RemoveBe'50mg'nkuaBSoNi$  BID on 3/23, and she increased it to three times daily the day prior to onset of slurred speech.  She had been having some dizziness even on the lower dose--felt off balance, needed to use her cane.  Felt like her equilibrium was off. She slept much better at night, and would find herself dozing off if she sat down for a while.  Didn't otherwise feel drowsy.  She reports that within a couple of days her vaginal pain and right side pain resolved.  She cut the dose back to BID for a couple of days, and then cut back to just once daily for two days.  Last dose was yesterday, hasn't taken any yet today.  Her dizziness has been improving, but still even today, not having taking any medication since yesterday, does feel 100%, still slightly off.  She reports her coumadin dose was increased, and she has f/u scheduled at coumadin clinic. Denies any bleeding.  Hypertension: BP was high at her last visit here.  She states it was  better when she rechecked it later in the day. She monitors her BP at home, and they have been ranging 130's-144/70's-80's (per pt's recollection).  PMH, PSH, SH reviewed. Outpatient Encounter Prescriptions as of 04/04/2015  Medication Sig Note  . calcium carbonate (TUMS - DOSED IN MG ELEMENTAL CALCIUM) 500 MG chewable tablet Chew 1  tablet by mouth 2 (two) times daily.   Marland Kitchen diltiazem (CARTIA XT) 240 MG 24 hr capsule Take 1 capsule (240 mg total) by mouth daily.   Marland Kitchen FERREX 150 150 MG capsule TAKE ONE CAPSULE BY MOUTH EVERY DAY   . hydrochlorothiazide (HYDRODIURIL) 25 MG tablet Take 12.5 mg by mouth as directed. 12.5 mg every other day 03/23/2015: Recently decreased to 1/2 tablet every other day by Dr. Mare Ferrari (due to high BUN and low Na)  . ketotifen (ZADITOR) 0.025 % ophthalmic solution Place 1 drop into both eyes daily as needed (dry eyes).   Marland Kitchen levothyroxine (SYNTHROID, LEVOTHROID) 25 MCG tablet Take 25 mcg by mouth daily before breakfast.   . lisinopril (PRINIVIL,ZESTRIL) 20 MG tablet Take 20 mg by mouth 2 (two) times daily.    . Magnesium 500 MG TABS Take 1 tablet by mouth daily. 03/30/2015: Pt would like to know best magnesium supplement.  . metoprolol (LOPRESSOR) 50 MG tablet Take 50 mg by mouth 2 (two) times daily.     . pantoprazole (PROTONIX) 40 MG tablet Take 40 mg by mouth daily.    . potassium chloride (K-DUR,KLOR-CON) 10 MEQ tablet Take 1 tablet (10 mEq total) by mouth 2 (two) times daily.   . pravastatin (PRAVACHOL) 20 MG tablet Take 1 tablet (20 mg total) by mouth every evening. 03/23/2015: Started 3/17  . zolpidem (AMBIEN) 5 MG tablet TAKE 1  TABLET BY MOUTH AT BEDTIME AS NEEDED FOR SLEEP 03/23/2015: Takes daily  . BESIVANCE 0.6 % SUSP  03/30/2015: Holding until cataract surgery.  . dibucaine (NUPERCAINAL) 1 % ointment Apply 1 application topically as needed.    . DUREZOL 0.05 % EMUL  03/30/2015: On hold until cataract surgery.  . ILEVRO 0.3 % SUSP  03/30/2015: On hold until cataract surgery.  . Lidocaine-Hydrocortisone Ace 2-2 % KIT Place 1 suppository rectally as needed (itching).    . Multiple Vitamin (MULITIVITAMIN WITH MINERALS) TABS Take 1 tablet by mouth daily.   . nitroGLYCERIN (NITROSTAT) 0.4 MG SL tablet Place 1 tablet (0.4 mg total) under the tongue every 5 (five) minutes as needed for chest pain. (Patient  not taking: Reported on 01/10/2015)   . pregabalin (LYRICA) 50 MG capsule Take 1 capsule (50 mg total) by mouth 3 (three) times daily. (Patient not taking: Reported on 04/04/2015)   . warfarin (COUMADIN) 5 MG tablet Take 2.5-5 mg by mouth daily at 6 PM. $Rem'5mg'OTlr$  on thu, and sun, 2.5 mg all other days 03/30/2015: 5 mg on Tues, Thurs, and Sat, 2.5 mg all other days.   Allergies  Allergen Reactions  . Celebrex [Celecoxib] Diarrhea  . Lipitor [Atorvastatin Calcium] Other (See Comments)    myalgias   ROS:  Denies chest pain, palpitations, shortness of breath, nausea, vomiting, abdominal pain. Denies bleeding, bruising, rash.  Denies fever, chills, URI symptoms, cough.  PHYSICAL EXAM: BP 162/98 mmHg  Pulse 56  Ht $R'5\' 6"'oZ$  (1.676 m)  Wt 128 lb 3.2 oz (58.151 kg)  BMI 20.70 kg/m2 170/90 on repeat by MD Well developed, pleasant, elderly female in no distress Neck: no lymphadenopathy, thyromegaly or carotid bruit Heart: Regular rate and rhythm, rate of 60.  Does not sound irregularly irregular today. No murmur, rub Lungs: clear bilaterally Skin: no bleeding/bruising. Neuro: alert and oriented, cranial nerves intact, normal strength, gait  ASSESSMENT/PLAN:  Neuropathy - suspected cause of vaginal pain, which resolved on Lyrica. +side effects.  Retry at low dose ($RemoveB'50mg'AeosiYPx$  qHS) if pain recurs  Essential hypertension - BP elevated in office, better at home. continue home monitoring, low sodium diet  Continue to hold off on taking the Lyrica for now--restart it at just $Remov'50mg'hbvPhy$  at bedtime only if/when your pain recurs.    If you still cannot tolerate this low dose (side effects outweigh the benefit), then we might want to retry the gabapentin, but make sure to titrate the dose up until you get some benefit (hopefully fewer side effects). Cymbalta at lose dose could be another option.  Continue to monitor your blood pressure at home--it is high here today.  At some point, you should bring your machine to a visit  with you, so that we can make sure your monitor is accurate.

## 2015-04-05 DIAGNOSIS — S82012D Displaced osteochondral fracture of left patella, subsequent encounter for closed fracture with routine healing: Secondary | ICD-10-CM | POA: Diagnosis not present

## 2015-04-06 ENCOUNTER — Ambulatory Visit: Payer: Medicare Other | Admitting: Family Medicine

## 2015-04-08 ENCOUNTER — Ambulatory Visit (INDEPENDENT_AMBULATORY_CARE_PROVIDER_SITE_OTHER): Payer: Medicare Other | Admitting: *Deleted

## 2015-04-08 DIAGNOSIS — I4891 Unspecified atrial fibrillation: Secondary | ICD-10-CM

## 2015-04-08 DIAGNOSIS — Z5181 Encounter for therapeutic drug level monitoring: Secondary | ICD-10-CM | POA: Diagnosis not present

## 2015-04-08 LAB — POCT INR: INR: 2

## 2015-04-11 ENCOUNTER — Telehealth: Payer: Self-pay | Admitting: Cardiology

## 2015-04-12 NOTE — Telephone Encounter (Signed)
msg left for pt that request was faxed to Dr Britta Mccreedy.

## 2015-04-12 NOTE — Telephone Encounter (Signed)
Per Dr Mare Ferrari, pt is cleared for extraction, pt may hold coumadin 4 days prior then return to usual dose.  I will have message faxed to Dr Britta Mccreedy.

## 2015-04-12 NOTE — Telephone Encounter (Signed)
Dr Mare Ferrari will be in office this afternoon and I will address this with him.

## 2015-04-12 NOTE — Telephone Encounter (Signed)
New message      1. What dental office are you calling from? Dr Britta Mccreedy  What is your office phone and fax number? Fax [phone] 2. What type of procedure is the patient having performed? Tooth extraction  3. What date is procedure scheduled? Not scheduled  4. What is your question (ex. Antibiotics prior to procedure, holding medication-we need to know how long dentist wants pt to hold med)? Need to hold coumadin

## 2015-04-18 ENCOUNTER — Telehealth: Payer: Self-pay | Admitting: Cardiology

## 2015-04-18 NOTE — Telephone Encounter (Signed)
New message      1. What dental office are you calling from? Dr Britta Mccreedy  What is your office phone and fax number? Fax [phone] What type of procedure is the patient having performed? extractions 2. What date is procedure scheduled? Not made yet  3. What is your question (ex. Antibiotics prior to procedure, holding medication-we need to know how long dentist wants pt to hold med)?  coumadin

## 2015-04-19 ENCOUNTER — Ambulatory Visit (INDEPENDENT_AMBULATORY_CARE_PROVIDER_SITE_OTHER): Payer: Medicare Other | Admitting: *Deleted

## 2015-04-19 DIAGNOSIS — I4891 Unspecified atrial fibrillation: Secondary | ICD-10-CM | POA: Diagnosis not present

## 2015-04-19 DIAGNOSIS — Z5181 Encounter for therapeutic drug level monitoring: Secondary | ICD-10-CM

## 2015-04-19 LAB — POCT INR: INR: 2.7

## 2015-04-19 NOTE — Telephone Encounter (Signed)
Faxed copy of clearance from Dr Mare Ferrari done on April 11th to Dr Britta Mccreedy office. See coumadin encounter for April 19th with further instructions to patient and date of dental procedure

## 2015-04-20 ENCOUNTER — Telehealth: Payer: Self-pay | Admitting: Family Medicine

## 2015-04-20 MED ORDER — GABAPENTIN 100 MG PO CAPS: 100.0000 mg | ORAL_CAPSULE | Freq: Three times a day (TID) | ORAL | Status: DC

## 2015-04-20 NOTE — Telephone Encounter (Signed)
Went over all details with patient,she did write down instructions and will call after being on 300mg  qhs x 7 days or sooner if any problems or questions occur. Sent rx to pharmacy.

## 2015-04-20 NOTE — Telephone Encounter (Signed)
Pt Only has 2 Lyrica pills left. Does Dr Tomi Bamberger want pt to continue taking Lyrica or switch over to Gabapentin? Pt will need refill on which ever med Dr Tomi Bamberger decides sent to [facility] @ [address], [place]

## 2015-04-20 NOTE — Telephone Encounter (Signed)
Spoke with patient and she is taking 50mg  Lyrica qhs. This is helping as far as the pain. The dizziness is still there, better but still dizzy from time to time. Also the swelling in her lower legs is still there. She would like to switch to the gabapentin but is worried that it takes a while to get into your system and she will have pain in the meantime. Wants to also know if you do switch her can she take both for a week or so until the gabapentin starts working?

## 2015-04-20 NOTE — Telephone Encounter (Signed)
Advise pt--the gabapentin shouldn't take any longer than the lyrica did to help--it just might take a while to titrate up to the effective dose.  I'm anxious about starting at a high dose given her side effects at relatively low doses of  Lyrica.  According to the computer, she had been on 100mg  of gabapentin in the past.  Rather than starting at 300mg  (which is a capsule and can't be cut), I'm going to prescribe 100mg  capsule--she should take 1 tablet for 2 days, at bedtime, and if no side effects, increase to 2 tablets at bedtime.  If she develops recurrent pain while at this dose, then increase to 300mg  at bedtime.  Stay at this dose for a full week, and call us (at the end of the week of taking 300mg  qHS) to let us know if she is tolerating the medication, and if she is having pain.  We will start adding in some daytime doses if she has recurrent pain.  The main side effects are sedation, which is why we start by dosing it at bedtime.  I'm afraid that if she takes the two medications together (lyrica and gabapentin), she might get too sedated, since she was so sensitive, so don't take them together, just switch right over from lyrica to the gabapentin.  rx 100mg  capsules, to be taken as directed above, #90, no refill.  Please make sure she writes down these directions, as they likely won't fit on bottle.

## 2015-04-20 NOTE — Telephone Encounter (Signed)
At her last visit we said once her dizziness resolved, and if pain recurred, she should retry Lyrica at low dose--just 50mg  qHS (she ended up in ER when taking twice daily).  Has she been taking it this way?  Is it helping (having vaginal/rectal pain?) is she having dizziness or side effects?  If doing well on it, continue at current dose and okay to refill x 3 months

## 2015-05-03 ENCOUNTER — Ambulatory Visit (INDEPENDENT_AMBULATORY_CARE_PROVIDER_SITE_OTHER): Payer: Medicare Other | Admitting: *Deleted

## 2015-05-03 DIAGNOSIS — Z5181 Encounter for therapeutic drug level monitoring: Secondary | ICD-10-CM | POA: Diagnosis not present

## 2015-05-03 DIAGNOSIS — I4891 Unspecified atrial fibrillation: Secondary | ICD-10-CM

## 2015-05-03 LAB — POCT INR: INR: 1.3

## 2015-05-04 ENCOUNTER — Telehealth: Payer: Self-pay | Admitting: *Deleted

## 2015-05-04 NOTE — Telephone Encounter (Signed)
Left message for patient to return my call.

## 2015-05-04 NOTE — Telephone Encounter (Signed)
Leg swelling likely is not related.  The main question is whether or not she is having any pain.  (and did she have pain at the lower doses are not). She might want to schedule a follow up appointment to further discuss and answer her questions

## 2015-05-04 NOTE — Telephone Encounter (Signed)
Patient has been on gabapentin for about 3 weeks and is up to the 300mg  dose. She called to follow up and say that the gabapentin in having the same effects as the Lyrica. She still has some dizziness and leg swelling, particularly the right leg. Dizziness subside a little in the am. She will continue to take unless you advise not to.

## 2015-05-04 NOTE — Telephone Encounter (Signed)
Patient advised.

## 2015-05-10 ENCOUNTER — Ambulatory Visit (INDEPENDENT_AMBULATORY_CARE_PROVIDER_SITE_OTHER): Payer: Private Health Insurance - Indemnity | Admitting: *Deleted

## 2015-05-10 DIAGNOSIS — I4891 Unspecified atrial fibrillation: Secondary | ICD-10-CM | POA: Diagnosis not present

## 2015-05-10 DIAGNOSIS — Z5181 Encounter for therapeutic drug level monitoring: Secondary | ICD-10-CM | POA: Diagnosis not present

## 2015-05-10 LAB — POCT INR: INR: 2.3

## 2015-05-11 ENCOUNTER — Ambulatory Visit (INDEPENDENT_AMBULATORY_CARE_PROVIDER_SITE_OTHER): Payer: Medicare Other | Admitting: Family Medicine

## 2015-05-11 ENCOUNTER — Encounter: Payer: Self-pay | Admitting: Family Medicine

## 2015-05-11 VITALS — BP 152/90 | HR 60 | Ht 66.0 in | Wt 128.4 lb

## 2015-05-11 DIAGNOSIS — J309 Allergic rhinitis, unspecified: Secondary | ICD-10-CM | POA: Diagnosis not present

## 2015-05-11 DIAGNOSIS — R030 Elevated blood-pressure reading, without diagnosis of hypertension: Secondary | ICD-10-CM | POA: Diagnosis not present

## 2015-05-11 DIAGNOSIS — G629 Polyneuropathy, unspecified: Secondary | ICD-10-CM | POA: Diagnosis not present

## 2015-05-11 DIAGNOSIS — IMO0001 Reserved for inherently not codable concepts without codable children: Secondary | ICD-10-CM

## 2015-05-11 DIAGNOSIS — R609 Edema, unspecified: Secondary | ICD-10-CM | POA: Diagnosis not present

## 2015-05-11 MED ORDER — PREGABALIN 50 MG PO CAPS: 50.0000 mg | ORAL_CAPSULE | Freq: Every day | ORAL | Status: DC

## 2015-05-11 NOTE — Progress Notes (Signed)
Chief Complaint  Patient presents with  . Follow-up    on neuropathy.   She had recurent vaginal/rectal pain after stopping the Lyrica. She was switched to neurontin, and is currently at 249m qHS.  She continues to have pain. She has been using dibucaine 1% ointment, which seems to help with her pain.  This was originally prescribed by Dr. MWatt Climesfor rectal pain.  The rectal pain has gone, mostly now having the vaginal pain.  Has used this ointment in the vaginal area, and it seems to work better than the Vagisil, since the ointment can stay on longer (vagisil seems to wipe away easier).  She uses it about 3 times daily, needing it every day.  She is feeling like she is having facial puffiness, swelling in the right leg, sometimes on the left.She may have had this prior to starting the medication, but not as often.  She also reports dizziness.  She does state that the dizziness subsided after stopping the lyrica, before starting the gabapentin, and recurred since being on the gabapentin.  She currently is taking 2035mat bedtime.  She describes a little brain fog, and slight light headedness. If she moves her head around a lot, she feels unsteady.  Denies true vertigo.  Chart was reviewed--see April phone call:  VeClinton Sawyert 04/20/2015 3:51 PM     Status: Signed       Expand All Collapse All   Spoke with patient and she is taking 5042myrica qhs. This is helping as far as the pain. The dizziness is still there, better but still dizzy from time to time. Also the swelling in her lower legs is still there. She would like to switch to the gabapentin but is worried that it takes a while to get into your system and she will have pain in the meantime. Wants to also know if you do switch her can she take both for a week or so until the gabapentin starts working?       So, it appears that leg swelling was there prior to starting the gabapentin,and it doesn't appear that the off balance/dizziness  was as bad on the lyrica as it is now on the gabapentin, as she reports that it is lasting more into the day now.    She has constant runny nose--not seasonal, chronic (used to be seasonally initially).  No sneezing, itchy or watery eyes. No sinus pain, PND, sore throat or cough. Hasn't used any medications for this.  Hypertension:  At home, BP runs 130/80.  Her HCTZ dose was decreased due to elevated BUN/Cr--she doesn't recall if the worsening in swelling was related to decreasing the diuretic dose.  Insomnia--falls asleep okay with the ambien, but lately she has been waking up and is unable to get back to sleep.  PMH, PSH, SH reviewed.  Outpatient Encounter Prescriptions as of 05/11/2015  Medication Sig  . calcium carbonate (TUMS - DOSED IN MG ELEMENTAL CALCIUM) 500 MG chewable tablet Chew 1 tablet by mouth 2 (two) times daily.  . dibucaine (NUPERCAINAL) 1 % ointment Apply 1 application topically as needed.   . diltiazem (CARTIA XT) 240 MG 24 hr capsule Take 1 capsule (240 mg total) by mouth daily.  . FMarland KitchenRREX 150 150 MG capsule TAKE ONE CAPSULE BY MOUTH EVERY DAY  . hydrochlorothiazide (HYDRODIURIL) 25 MG tablet Take 12.5 mg by mouth as directed. 12.5 mg every other day  . ketotifen (ZADITOR) 0.025 % ophthalmic solution Place 1 drop  into both eyes daily as needed (dry eyes).  Marland Kitchen levothyroxine (SYNTHROID, LEVOTHROID) 25 MCG tablet Take 25 mcg by mouth daily before breakfast.  . lisinopril (PRINIVIL,ZESTRIL) 20 MG tablet Take 20 mg by mouth 2 (two) times daily.   . Magnesium 500 MG TABS Take 1 tablet by mouth daily.  . metoprolol (LOPRESSOR) 50 MG tablet Take 50 mg by mouth 2 (two) times daily.    . Multiple Vitamin (MULITIVITAMIN WITH MINERALS) TABS Take 1 tablet by mouth daily.  . pantoprazole (PROTONIX) 40 MG tablet Take 40 mg by mouth daily.   . potassium chloride (K-DUR,KLOR-CON) 10 MEQ tablet Take 1 tablet (10 mEq total) by mouth 2 (two) times daily.  . pravastatin (PRAVACHOL) 20 MG  tablet Take 1 tablet (20 mg total) by mouth every evening.  . warfarin (COUMADIN) 5 MG tablet Take 2.5-5 mg by mouth daily at 6 PM. $Rem'5mg'eiTU$  on thu, and sun, 2.5 mg all other days  . zolpidem (AMBIEN) 5 MG tablet TAKE 1 TABLET BY MOUTH AT BEDTIME AS NEEDED FOR SLEEP  . [DISCONTINUED] gabapentin (NEURONTIN) 100 MG capsule Take 1 capsule (100 mg total) by mouth 3 (three) times daily.  Marland Kitchen BESIVANCE 0.6 % SUSP   . DUREZOL 0.05 % EMUL   . ILEVRO 0.3 % SUSP   . Lidocaine-Hydrocortisone Ace 2-2 % KIT Place 1 suppository rectally as needed (itching).   . nitroGLYCERIN (NITROSTAT) 0.4 MG SL tablet Place 1 tablet (0.4 mg total) under the tongue every 5 (five) minutes as needed for chest pain. (Patient not taking: Reported on 01/10/2015)  . pregabalin (LYRICA) 50 MG capsule Take 1 capsule (50 mg total) by mouth at bedtime.  . [DISCONTINUED] pregabalin (LYRICA) 50 MG capsule Take 1 capsule (50 mg total) by mouth 3 (three) times daily. (Patient not taking: Reported on 04/04/2015)   No facility-administered encounter medications on file as of 05/11/2015.   (prior to visit taking $RemoveBefo'200mg'mqgPNzdPKMx$  of gabapentin, and NOT taking lyrica).  Allergies  Allergen Reactions  . Celebrex [Celecoxib] Diarrhea  . Lipitor [Atorvastatin Calcium] Other (See Comments)    myalgias   ROS: no fevers, chills, cough, shortness of breath, chest pain, nausea, vomiting, bowel changes, bleeding/bruising, rash.  Denies depression.  +insomnia.  +vaginal discomfort. Denies vaginal bleeding, discharge, itching, no urinary complaints.  See HPI  PHYSICAL EXAM: BP 152/90 mmHg  Pulse 60  Ht $R'5\' 6"'GQ$  (1.676 m)  Wt 128 lb 6.4 oz (58.242 kg)  BMI 20.73 kg/m2 Pt's monitor was 146/90 (accurate) Thin, elderly female in no distress. HEENT: PERRL, EOMI, conjunctiva clear.  TM's and EAC's normal. Nasal mucosa is moderately edematous, no purulence or erythema. Sinuses nontender. OP is clear Neck: no lymphadenopathy or mass Heart: regular rate and rhythm Lungs:  clear bilaterally Skin: no bleeding/bruising. Neuro: alert and oriented, cranial nerves intact, normal strength, gait Extremities: Trace edema  ASSESSMENT/PLAN:  Neuropathy - suspected, given lack of pain relief with other treatments.  Prev got pain relief from Lyrica, but side effects.  Try at 50qHS, and consider qod if signif SEs - Plan: pregabalin (LYRICA) 50 MG capsule  Elevated BP - normal at home; home monitor is accurate.  Allergic rhinitis, unspecified allergic rhinitis type - may be contributing to her facial fullness/swellin and vertigo/dizziness. Try claritin  Edema - doubt side effect of gabapentin or lyrica.  may be related to decreased HCTZ dose and/or Ca channel blocker. Try support hose   Stop the gabapentin, since it isn't effective in treating the pain, and you are having  side effects even at this low dose, which may get worse as we try and increase the dose (by giving daytime doses).  It appears that your pain was well controlled on the Lyrica, when taken just at bedtime.   Restart lyrica at bedtime. If the pain completely resolves, but the equilibrium problems remain, consider using it just every other night. Start taking claritin once daily--this might help with the runny nose, and perhaps with the dizziness (if related to an ear problem related to allergies). You definitely have evidence of inflamed mucus membranes in your nose.  If the claritin doesn't help with the runny nose, you can add Flonase (2 sprays into each nostril once daily).  This is available over the counter.  Insomnia--let's see how your sleep does when you are back on the Lyrica--it might be better.  The swelling in your feet may be related to other medications (ie diltiazem can cause swelling) and also due to the fact that your diuretic (HCTZ) dose was decreased which would help TREAT the swelling.  Compression stockings will also help keep the swelling out from the legs (to prevent it getting worse  during the day).

## 2015-05-11 NOTE — Patient Instructions (Addendum)
  Stop the gabapentin, since it isn't effective in treating the pain, and you are having side effects even at this low dose, which may get worse as we try and increase the dose (by giving daytime doses).  It appears that your pain was well controlled on the Lyrica, when taken just at bedtime.   Restart lyrica at bedtime. If the pain completely resolves, but the equilibrium problems remain, consider using it just every other night. Start taking claritin once daily--this might help with the runny nose, and perhaps with the dizziness (if related to an ear problem related to allergies). You definitely have evidence of inflamed mucus membranes in your nose.  If the claritin doesn't help with the runny nose, you can add Flonase (2 sprays into each nostril once daily).  This is available over the counter.  Insomnia--let's see how your sleep does when you are back on the Lyrica--it might be better.  The swelling in your feet may be related to other medications (ie diltiazem can cause swelling) and also due to the fact that your diuretic (HCTZ) dose was decreased which would help TREAT the swelling.  Compression stockings will also help keep the swelling out from the legs (to prevent it getting worse during the day).

## 2015-05-12 ENCOUNTER — Telehealth: Payer: Self-pay | Admitting: Family Medicine

## 2015-05-12 DIAGNOSIS — G629 Polyneuropathy, unspecified: Secondary | ICD-10-CM

## 2015-05-12 MED ORDER — PREGABALIN 50 MG PO CAPS: 50.0000 mg | ORAL_CAPSULE | Freq: Every day | ORAL | Status: DC

## 2015-05-12 MED ORDER — DIBUCAINE 1 % EX OINT: 1.0000 "application " | TOPICAL_OINTMENT | CUTANEOUS | Status: DC | PRN

## 2015-05-12 NOTE — Telephone Encounter (Signed)
Pt called and states Lyrica was not at pharmacy, so I called it in.  Pt also asked about the Dibucaine ointment refill.  Please advise pt.

## 2015-05-12 NOTE — Telephone Encounter (Signed)
Called in both rx's to [facility] and notified patient.

## 2015-05-12 NOTE — Telephone Encounter (Signed)
I accidentally forgot to change order type to  Normal from sample.  Please call in as I thought I ordered yesterday.  Okay to refill the dibucaine as well (she didn't actually ask me for refill yesterday at visit)

## 2015-05-18 ENCOUNTER — Telehealth: Payer: Self-pay | Admitting: *Deleted

## 2015-05-18 MED ORDER — SUVOREXANT 15 MG PO TABS: 1.0000 | ORAL_TABLET | Freq: Every evening | ORAL | Status: DC | PRN

## 2015-05-18 MED ORDER — SUVOREXANT 10 MG PO TABS: 1.0000 | ORAL_TABLET | Freq: Every evening | ORAL | Status: DC | PRN

## 2015-05-18 NOTE — Telephone Encounter (Signed)
We should not go over 5mg . She can either try taking melatonin along with it, or we need to change to Rozerem (change, not add to). I can give more detailed info and give samples to try (this may be more expensive for her, not sure) if interested in switching

## 2015-05-18 NOTE — Telephone Encounter (Signed)
Patient called and states the the [place] you gave her is no longer working. Worked great at first but over the last couple weeks is not working at all. Can you increase strength? Or maybe try different med?

## 2015-05-18 NOTE — Telephone Encounter (Signed)
My error--I meant Belsomra.  Give #5 of 10mg , and #10 of 15mg .  Have her call us after taking to see if either was effective for Rx (vs increasing to 20mg ).

## 2015-05-18 NOTE — Telephone Encounter (Signed)
Spoke with patient and went over instructions and gave her #6 of each, Belsomra 10mg  and 15mg . She will try the 10mg  and if ineffective move up to the 15mg . She will call and let Dr.Knapp know if 15mg  was ineffective and Dr.Knapp will try 20mg  at that time. If 10mg  or 15mg  is effective we can call in rx.

## 2015-05-18 NOTE — Telephone Encounter (Signed)
Patient states that she has already tried melatonin without any difference. Would like to change to Rozerem.

## 2015-05-23 DIAGNOSIS — H2511 Age-related nuclear cataract, right eye: Secondary | ICD-10-CM | POA: Diagnosis not present

## 2015-05-23 DIAGNOSIS — H25811 Combined forms of age-related cataract, right eye: Secondary | ICD-10-CM | POA: Diagnosis not present

## 2015-05-23 DIAGNOSIS — Z961 Presence of intraocular lens: Secondary | ICD-10-CM | POA: Diagnosis not present

## 2015-05-24 DIAGNOSIS — H2512 Age-related nuclear cataract, left eye: Secondary | ICD-10-CM | POA: Diagnosis not present

## 2015-05-26 ENCOUNTER — Telehealth: Payer: Self-pay | Admitting: Family Medicine

## 2015-05-26 NOTE — Telephone Encounter (Signed)
[facility] requesting refill for pt. Medication on the following 4 medications:  IPRATROPIUM BR NSL SPR15ML 0.06%  LISINOPRIL TABS 20MG   PANTOPRAZOLE SOD DR TABS 40MG   HYDROCHLOROTHIAZIDE CAPS 12.5MG    [facility] fax # [phone]

## 2015-05-27 MED ORDER — HYDROCHLOROTHIAZIDE 25 MG PO TABS: 12.5000 mg | ORAL_TABLET | ORAL | Status: DC

## 2015-05-27 MED ORDER — LISINOPRIL 20 MG PO TABS: 20.0000 mg | ORAL_TABLET | Freq: Two times a day (BID) | ORAL | Status: DC

## 2015-05-27 MED ORDER — PANTOPRAZOLE SODIUM 40 MG PO TBEC: 40.0000 mg | DELAYED_RELEASE_TABLET | Freq: Every day | ORAL | Status: DC

## 2015-05-27 MED ORDER — IPRATROPIUM BROMIDE 0.06 % NA SOLN: 2.0000 | Freq: Four times a day (QID) | NASAL | Status: DC

## 2015-05-27 NOTE — Telephone Encounter (Signed)
Done x 90 days. 

## 2015-05-31 ENCOUNTER — Ambulatory Visit (INDEPENDENT_AMBULATORY_CARE_PROVIDER_SITE_OTHER): Payer: Medicare Other | Admitting: Pharmacist

## 2015-05-31 DIAGNOSIS — I4891 Unspecified atrial fibrillation: Secondary | ICD-10-CM

## 2015-05-31 DIAGNOSIS — Z5181 Encounter for therapeutic drug level monitoring: Secondary | ICD-10-CM | POA: Diagnosis not present

## 2015-05-31 LAB — POCT INR: INR: 2.2

## 2015-06-07 ENCOUNTER — Other Ambulatory Visit: Payer: Self-pay | Admitting: Family Medicine

## 2015-06-09 ENCOUNTER — Other Ambulatory Visit: Payer: Self-pay | Admitting: Cardiology

## 2015-06-09 ENCOUNTER — Encounter: Payer: Self-pay | Admitting: Cardiology

## 2015-06-09 ENCOUNTER — Ambulatory Visit (INDEPENDENT_AMBULATORY_CARE_PROVIDER_SITE_OTHER): Payer: Medicare Other | Admitting: Cardiology

## 2015-06-09 VITALS — BP 120/70 | HR 51 | Ht 66.0 in | Wt 129.0 lb

## 2015-06-09 DIAGNOSIS — E7800 Pure hypercholesterolemia, unspecified: Secondary | ICD-10-CM

## 2015-06-09 DIAGNOSIS — G8929 Other chronic pain: Secondary | ICD-10-CM | POA: Insufficient documentation

## 2015-06-09 DIAGNOSIS — I10 Essential (primary) hypertension: Secondary | ICD-10-CM

## 2015-06-09 DIAGNOSIS — R0989 Other specified symptoms and signs involving the circulatory and respiratory systems: Secondary | ICD-10-CM

## 2015-06-09 DIAGNOSIS — M79606 Pain in leg, unspecified: Secondary | ICD-10-CM | POA: Diagnosis not present

## 2015-06-09 DIAGNOSIS — I480 Paroxysmal atrial fibrillation: Secondary | ICD-10-CM

## 2015-06-09 LAB — LIPID PANEL
CHOLESTEROL: 175 mg/dL (ref 0–200)
HDL: 59.9 mg/dL (ref >39.00)
LDL CALC: 95 mg/dL (ref 0–99)
NonHDL: 115.1
TRIGLYCERIDES: 103 mg/dL (ref 0.0–149.0)
Total CHOL/HDL Ratio: 3
VLDL: 20.6 mg/dL (ref 0.0–40.0)

## 2015-06-09 LAB — HEPATIC FUNCTION PANEL
ALT: 12 U/L (ref 0–35)
AST: 22 U/L (ref 0–37)
Albumin: 4.7 g/dL (ref 3.5–5.2)
Alkaline Phosphatase: 67 U/L (ref 39–117)
Bilirubin, Direct: 0.1 mg/dL (ref 0.0–0.3)
Total Bilirubin: 0.6 mg/dL (ref 0.2–1.2)
Total Protein: 7.2 g/dL (ref 6.0–8.3)

## 2015-06-09 LAB — BASIC METABOLIC PANEL
BUN: 17 mg/dL (ref 6–23)
CHLORIDE: 94 meq/L — AB (ref 96–112)
CO2: 32 meq/L (ref 19–32)
Calcium: 10.2 mg/dL (ref 8.4–10.5)
Creatinine, Ser: 1.23 mg/dL — ABNORMAL HIGH (ref 0.40–1.20)
GFR: 44.88 mL/min — AB (ref >60.00)
Glucose, Bld: 89 mg/dL (ref 70–99)
Potassium: 4.3 mEq/L (ref 3.5–5.1)
Sodium: 129 mEq/L — ABNORMAL LOW (ref 135–145)

## 2015-06-09 NOTE — Patient Instructions (Signed)
Medication Instructions:  Your physician recommends that you continue on your current medications as directed. Please refer to the Current Medication list given to you today.  Labwork: Lp/bmet/hfp  Testing/Procedures: Your physician has requested that you have a lower or upper extremity arterial duplex. This test is an ultrasound of the arteries in the legs or arms. It looks at arterial blood flow in the legs and arms. Allow one hour for Lower and Upper Arterial scans. There are no restrictions or special instructions  Follow-Up: Your physician wants you to follow-up in: 6 months with fasting labs (lp/bmet/hfp) and ekg  You will receive a reminder letter in the mail two months in advance. If you don't receive a letter, please call our office to schedule the follow-up appointment.

## 2015-06-09 NOTE — Progress Notes (Signed)
Quick Note:  Please report to patient. The recent labs are stable. Continue same medication and careful diet. ______ 

## 2015-06-09 NOTE — Progress Notes (Signed)
Cardiology Office Note   Date:  06/09/2015   ID:  Lisa Lucero, DOB 09-06-1935, MRN [id number]  PCP:  Lisa Jumbo, MD  Cardiologist: Lisa Clement MD  No chief complaint on file.     History of Present Illness: Lisa Lucero is a 80 y.o. female who presents for a six-month office visit.  This pleasant 80 year old woman is seen back for a scheduled followup office visit. She has a past history of paroxysmal atrial fibrillation. She was last hospitalized for atrial fibrillation in March of 2010. She is on long-term Coumadin. After she had gone off Coumadin and was maintaining normal sinus rhythm she was hospitalized with an embolic stroke and was placed back on long-term Coumadin. The Coumadin had to be held in October 2011 when she was admitted to [facility] with a GI bleed secondary to erosive duodenitis. She's had no further problems with her stomach since being placed on long-term proton pump inhibitor. She underwent back surgery by Lisa Lucero on 09/29/2014 And underwent surgery for her patella fracture on 10/25/2014 also by Lisa Lucero. He has not had any recurrence of atrial fibrillation that she is aware of.  She does have occasional dizzy spells.  She has been having leg pain.  She has had a trial of gabapentin 4 peripheral neuropathy with no improvement.  She tried Lyrica which made her dizziness worse.  She has a history of hypercholesterolemia and is on pravastatin.  Past Medical History  Diagnosis Date  . Paroxysmal atrial fibrillation   . History of embolic stroke 06/2010  . Erosive gastritis     with GI bleed thought due to elevated INR and duodenitis  . Hyperthyroidism   . Peripheral edema   . Long-term (current) use of anticoagulants   . HTN (hypertension)   . Stroke   . GERD (gastroesophageal reflux disease)   . Back pain   . COPD (chronic obstructive pulmonary disease)     pt denies on 09/27/14  . H/O hypercholesterolemia     PMH: only  . Arthritis   . Anemia      Iron deficiency anemia  . Hypothyroidism   . Insomnia     prev tried Trazadone (2014); ambien CR caused hangover; on ambien since 11/2013  . Osteoporosis   . Hypothyroidism   . H/O: GI bleed   . Osteoarthritis of both knees   . Primary osteoarthritis of both hips   . Osteoarthritis of lumbar spine   . H/O transfusion of packed red blood cells   . External hemorrhoids without complication   . Pyuria   . Postmenopausal   . Arthropathy   . Blood in stool   . Iron deficiency anemia due to chronic blood loss   . Hemorrhage of rectum and anus   . Chronic kidney disease   . CVA (cerebral vascular accident)     Past Surgical History  Procedure Laterality Date  . Hemorrhoid surgery    . Operative hysteroscopy    . Abdominal hysterectomy    . Cholecystectomy  03/11/2012    Procedure: LAPAROSCOPIC CHOLECYSTECTOMY WITH INTRAOPERATIVE CHOLANGIOGRAM;  Surgeon: Lisa Pu. Cornett, MD;  Location: [facility] OR;  Service: General;  Laterality: N/A;  . Breast enhancement surgery    . Colonoscopy    . Wisdom tooth extraction    . Lumbar laminectomy N/A 09/29/2014    Procedure: L4-5 Decompression, Hemi-Laminectomy,  Removal Free Fragment;  Surgeon: Lisa Manges, MD;  Location: [facility] OR;  Service: Orthopedics;  Laterality: N/A;  . Orif patella Left 10/25/2014    Procedure: OPEN REDUCTION INTERNAL (ORIF) FIXATION LEFT PATELLA;  Surgeon: Lisa Killings, MD;  Location: [facility];  Service: Orthopedics;  Laterality: Left;  . Cholecystectomy    . Cyst excision  11/2014    left chest wall--nodular hidroadenoma     Current Outpatient Prescriptions  Medication Sig Dispense Refill  . calcium carbonate (TUMS - DOSED IN MG ELEMENTAL CALCIUM) 500 MG chewable tablet Chew 1 tablet by mouth 2 (two) times daily.    . dibucaine (NUPERCAINAL) 1 % ointment Apply 1 application topically 3 (three) times daily as needed for pain (for vaginal discomfort).    Marland Kitchen diltiazem (CARDIZEM CD) 240 MG 24 hr capsule Take 240 mg by mouth  daily.    Marland Kitchen FERREX 150 150 MG capsule TAKE ONE CAPSULE BY MOUTH EVERY DAY 30 capsule 2  . gabapentin (NEURONTIN) 100 MG capsule Take 2 capsules by mouth at bedtime.  0  . hydrochlorothiazide (HYDRODIURIL) 25 MG tablet Take 12.5 mg by mouth every other day.    . ILEVRO 0.3 % SUSP   1  . ipratropium (ATROVENT) 0.06 % nasal spray Place 2 sprays into both nostrils 4 (four) times daily. 15 mL 0  . ketotifen (ZADITOR) 0.025 % ophthalmic solution Place 1 drop into both eyes daily as needed (dry eyes).    Marland Kitchen levothyroxine (SYNTHROID, LEVOTHROID) 25 MCG tablet Take 25 mcg by mouth daily before breakfast.    . Lidocaine-Hydrocortisone Ace 2-2 % KIT Place 1 suppository rectally as needed (itching).   0  . lisinopril (PRINIVIL,ZESTRIL) 20 MG tablet Take 1 tablet (20 mg total) by mouth 2 (two) times daily. 180 tablet 0  . Magnesium 500 MG TABS Take 1 tablet by mouth daily.    . metoprolol (LOPRESSOR) 50 MG tablet Take 50 mg by mouth 2 (two) times daily.      . Multiple Vitamin (MULITIVITAMIN WITH MINERALS) TABS Take 1 tablet by mouth daily.    . nitroGLYCERIN (NITROSTAT) 0.4 MG SL tablet Place 1 tablet (0.4 mg total) under the tongue every 5 (five) minutes as needed for chest pain. 25 tablet PRN  . pantoprazole (PROTONIX) 40 MG tablet Take 1 tablet (40 mg total) by mouth daily. 90 tablet 0  . potassium chloride (K-DUR,KLOR-CON) 10 MEQ tablet Take 1 tablet (10 mEq total) by mouth 2 (two) times daily. 180 tablet 1  . pravastatin (PRAVACHOL) 20 MG tablet Take 1 tablet (20 mg total) by mouth every evening. 30 tablet 5  . pregabalin (LYRICA) 50 MG capsule Take 1 capsule (50 mg total) by mouth at bedtime. 30 capsule 5  . Suvorexant 15 MG TABS Take 1 tablet by mouth daily as needed (sleep).    . warfarin (COUMADIN) 5 MG tablet Take 2.5-5 mg by mouth daily at 6 PM. $Rem'5mg'MnCY$  on thu, and sun, 2.5 mg all other days    . zolpidem (AMBIEN) 5 MG tablet TAKE 1 TABLET BY MOUTH AT BEDTIME AS NEEDED FOR SLEEP 90 tablet 0  .  [DISCONTINUED] diphenhydrAMINE (BENADRYL) 25 MG tablet Take 25 mg by mouth every 6 (six) hours as needed.      . [DISCONTINUED] IRON PO Take 1 tablet by mouth daily.     . [DISCONTINUED] potassium chloride (K-DUR) 10 MEQ tablet Take 1 tablet (10 mEq total) by mouth 2 (two) times daily. 90 tablet 0   No current facility-administered medications for this visit.    Allergies:   Celebrex and Lipitor  Social History:  The patient  reports that she quit smoking about 4 years ago. Her smoking use included Cigarettes. She has never used smokeless tobacco. She reports that she does not drink alcohol or use illicit drugs.   Family History:  The patient's family history includes Arthritis in an other family member; Cancer in her brother; Dementia in her father; Hypertension in an other family member; Stroke in her mother.    ROS:  Please see the history of present illness.   Otherwise, review of systems are positive for none.   All other systems are reviewed and negative.    PHYSICAL EXAM: VS:  BP 120/70 mmHg  Pulse 51  Ht $R'5\' 6"'qE$  (1.676 m)  Wt 129 lb (58.514 kg)  BMI 20.83 kg/m2 , BMI Body mass index is 20.83 kg/(m^2). GEN: Well nourished, well developed, in no acute distress HEENT: normal Neck: no JVD, carotid bruits, or masses Cardiac: RRR; no murmurs, rubs, or gallops,no edema  Respiratory:  clear to auscultation bilaterally, normal work of breathing GI: soft, nontender, nondistended, + BS MS: no deformity or atrophy.  Pedal pulses are weak. Skin: warm and dry, no rash Neuro:  Strength and sensation are intact Psych: euthymic mood, full affect   EKG:  EKG is not ordered today.    Recent Labs: 12/13/2014: TSH 0.536 03/30/2015: Hemoglobin 13.3; Platelets 197 06/09/2015: ALT 12; BUN 17; Creatinine, Ser 1.23*; Potassium 4.3; Sodium 129*    Lipid Panel    Component Value Date/Time   CHOL 175 06/09/2015 0911   TRIG 103.0 06/09/2015 0911   HDL 59.90 06/09/2015 0911   CHOLHDL 3  06/09/2015 0911   VLDL 20.6 06/09/2015 0911   LDLCALC 95 06/09/2015 0911      Wt Readings from Last 3 Encounters:  06/09/15 129 lb (58.514 kg)  05/11/15 128 lb 6.4 oz (58.242 kg)  04/04/15 128 lb 3.2 oz (58.151 kg)        ASSESSMENT AND PLAN:  1. Paroxysmal atrial fibrillation, maintaining normal sinus rhythm 2. Essential hypertension 3. Recent surgery on left patella by Dr. Rodell Perna, doing well 4. Peripheral neuropathy. 5. Hypercholesterolemia on statin 6.  Decreased pulses in lower extremities Disposition: Continue on current medication. Recheck in 6 months for follow-up office visit and fasting lab work.  We will get arterial duplex of legs to evaluate her distal pulses   Current medicines are reviewed at length with the patient today.  The patient does not have concerns regarding medicines.  The following changes have been made:  no change  Labs/ tests ordered today include:   Orders Placed This Encounter  Procedures  . Lipid panel  . Hepatic function panel  . Basic metabolic panel     Disposition: Recheck in 6 months for office visit lipid panel hepatic function panel and basal metabolic panel.  Blood work from today is pending  Berna Spare MD 06/09/2015 5:04 PM    [address], [place], [place]  [address] Phone: [phone]; Fax: [phone]

## 2015-06-10 ENCOUNTER — Telehealth: Payer: Self-pay | Admitting: Cardiology

## 2015-06-10 ENCOUNTER — Ambulatory Visit (HOSPITAL_COMMUNITY): Payer: Medicare Other | Attending: Cardiology

## 2015-06-10 DIAGNOSIS — R0989 Other specified symptoms and signs involving the circulatory and respiratory systems: Secondary | ICD-10-CM

## 2015-06-10 DIAGNOSIS — Z87891 Personal history of nicotine dependence: Secondary | ICD-10-CM | POA: Insufficient documentation

## 2015-06-10 DIAGNOSIS — Z8673 Personal history of transient ischemic attack (TIA), and cerebral infarction without residual deficits: Secondary | ICD-10-CM | POA: Diagnosis not present

## 2015-06-10 DIAGNOSIS — I499 Cardiac arrhythmia, unspecified: Secondary | ICD-10-CM | POA: Diagnosis not present

## 2015-06-10 DIAGNOSIS — E785 Hyperlipidemia, unspecified: Secondary | ICD-10-CM | POA: Diagnosis not present

## 2015-06-10 DIAGNOSIS — I10 Essential (primary) hypertension: Secondary | ICD-10-CM | POA: Insufficient documentation

## 2015-06-10 DIAGNOSIS — J449 Chronic obstructive pulmonary disease, unspecified: Secondary | ICD-10-CM | POA: Insufficient documentation

## 2015-06-10 DIAGNOSIS — I4891 Unspecified atrial fibrillation: Secondary | ICD-10-CM | POA: Insufficient documentation

## 2015-06-10 DIAGNOSIS — I2510 Atherosclerotic heart disease of native coronary artery without angina pectoris: Secondary | ICD-10-CM | POA: Diagnosis not present

## 2015-06-10 NOTE — Telephone Encounter (Signed)
Advised patient of lab results  

## 2015-06-10 NOTE — Telephone Encounter (Signed)
Pt rtn call re "sonar" results--pls call

## 2015-06-10 NOTE — Telephone Encounter (Signed)
-----   Message from Darlin Coco, MD sent at 06/09/2015  6:14 PM EDT ----- Please report to patient.  The recent labs are stable. Continue same medication and careful diet.

## 2015-06-14 ENCOUNTER — Telehealth: Payer: Self-pay | Admitting: Family Medicine

## 2015-06-14 ENCOUNTER — Telehealth: Payer: Self-pay

## 2015-06-14 NOTE — Telephone Encounter (Signed)
She did the right thing by taking extra diltiazem.

## 2015-06-14 NOTE — Telephone Encounter (Signed)
Pt aware of LE study. The circulation to the lower legs and feet is normal, so her symptoms are not related to poor circulation. Continue current meds  Pt sts that she had an episode if Afib this morning. She was unable to ck her heartrate or bp because her bp machine was unable to read. She was fatigued and felt washed out which led her to believe she was in Afib. Pt took an additional Diltiazem this afternoon @ 2pm. She is currently asymptomatic and feels she is back in NSR.  Pt adv to call the office if she is having prolonged or frequent episodes of Afib, and having symptoms. Adv her I would fwd an update to Dr.Brackbill to see if he has any additional recommendations. Pt agreeable with plan and verbalized understanding.

## 2015-06-14 NOTE — Telephone Encounter (Signed)
Pt. Called in requesting a new prescription be written for BELSOMRA 15mg . Pt. States she was given some samples to try and the belsomra 15mg  worked the best for her. Pt. Is [facility]   [facility] [phone]       Fax# [phone]   [place] @ [phone] when ready.

## 2015-06-14 NOTE — Telephone Encounter (Signed)
Follow up ° ° ° ° °Returning a nurses call °

## 2015-06-14 NOTE — Telephone Encounter (Signed)
-----   Message from Darlin Coco, MD sent at 06/10/2015  8:34 PM EDT ----- Please report.  The circulation to the lower legs and feet is normal, so her symptoms are not related to poor circulation. Continue current meds.

## 2015-06-14 NOTE — Telephone Encounter (Signed)
Called to give pt Le study results.lmtcb

## 2015-06-14 NOTE — Telephone Encounter (Signed)
Ok for #30 with 2 refills 

## 2015-06-15 MED ORDER — SUVOREXANT 15 MG PO TABS: 1.0000 | ORAL_TABLET | Freq: Every day | ORAL | Status: DC | PRN

## 2015-06-15 NOTE — Telephone Encounter (Signed)
rx called in and patient notified.

## 2015-06-16 ENCOUNTER — Telehealth: Payer: Self-pay | Admitting: *Deleted

## 2015-06-16 MED ORDER — ESZOPICLONE 2 MG PO TABS: 2.0000 mg | ORAL_TABLET | Freq: Every evening | ORAL | Status: DC | PRN

## 2015-06-16 NOTE — Telephone Encounter (Signed)
Patient willing to try, called in rx.

## 2015-06-16 NOTE — Telephone Encounter (Signed)
Has she ever tried [place]? She can try Lunesta 2mg .  Usual starting dose is 1mg , so she could try 1/2 tablet for a few days, and if tolerates it but is ineffective, she can increase to full tablet at bedtime. #30, no refill. Then she can let us know how it worked.  If 1/2 tablet effective, we can change to lower dose to make it easier, not have to cut.

## 2015-06-16 NOTE — Telephone Encounter (Signed)
Patient called and states that Belsomra was $100 at pharmacy and she cannot afford. Has medicare so discount card will not work. Would like something else called in. Stated that Lorrin Mais is not her first choice but would be better than nothing. [facility]ses [facility].

## 2015-06-17 DIAGNOSIS — H25812 Combined forms of age-related cataract, left eye: Secondary | ICD-10-CM | POA: Diagnosis not present

## 2015-06-17 DIAGNOSIS — H2512 Age-related nuclear cataract, left eye: Secondary | ICD-10-CM | POA: Diagnosis not present

## 2015-06-17 DIAGNOSIS — Z961 Presence of intraocular lens: Secondary | ICD-10-CM | POA: Diagnosis not present

## 2015-06-20 ENCOUNTER — Other Ambulatory Visit: Payer: Self-pay | Admitting: Cardiology

## 2015-06-20 ENCOUNTER — Other Ambulatory Visit: Payer: Self-pay | Admitting: *Deleted

## 2015-06-20 MED ORDER — WARFARIN SODIUM 5 MG PO TABS: 2.5000 mg | ORAL_TABLET | Freq: Every day | ORAL | Status: DC

## 2015-06-20 NOTE — Telephone Encounter (Signed)
Refill sent to [facility] as requested 

## 2015-06-20 NOTE — Telephone Encounter (Signed)
New message      [facility]

## 2015-06-27 ENCOUNTER — Other Ambulatory Visit: Payer: Self-pay

## 2015-06-28 ENCOUNTER — Ambulatory Visit (INDEPENDENT_AMBULATORY_CARE_PROVIDER_SITE_OTHER): Payer: Medicare Other

## 2015-06-28 DIAGNOSIS — I4891 Unspecified atrial fibrillation: Secondary | ICD-10-CM | POA: Diagnosis not present

## 2015-06-28 DIAGNOSIS — Z5181 Encounter for therapeutic drug level monitoring: Secondary | ICD-10-CM | POA: Diagnosis not present

## 2015-06-28 LAB — POCT INR: INR: 4.1

## 2015-06-29 ENCOUNTER — Telehealth: Payer: Self-pay | Admitting: Family Medicine

## 2015-06-29 MED ORDER — ZOLPIDEM TARTRATE ER 6.25 MG PO TBCR: 6.2500 mg | EXTENDED_RELEASE_TABLET | Freq: Every day | ORAL | Status: DC

## 2015-06-29 NOTE — Telephone Encounter (Signed)
Phoned in and patient notified.

## 2015-06-29 NOTE — Telephone Encounter (Signed)
It is a SAFETY issue.  For some people's insurance, they use the safety recommendation to base coverage.  I have many patients who canNOT get regular ambien because their insurance won't cover it (for this reason).  I don't think I specified HER insurance didn't cover.  I try and do what is best and safest for my patients, and that is to not use over 5mg  of ambien in patients over 65.  Same goes for using over 2mg  of Lunesta. Belsomra can safely be used up to 20mg  daily, if needed. I believe she couldn't afford the $100--can we check with Izora Gala re coverage/cost (drug rep).  She IS eligible to have 10 free pills at each dose with card --not sure if she has already used all of those.  When she came to [place], she was on ambien CR 6.25mg .  She was not on higher doses.  The CR is safer to use long-term than the plain ambien.  The 6.25mg  dose is the equivalent to the 5mg  of plain ambien.  So, I didn't decrease her dose from what she had been getting (I'm guessing that the CR was too expensive?? I can't recall why she was changed)

## 2015-06-29 NOTE — Telephone Encounter (Signed)
Ok to try ambien CR 6.25 #30 with 2 refills.  She absolutely CanNOT cut these--they are extended release, and cutting them will make it no longer extended release.

## 2015-06-29 NOTE — Telephone Encounter (Signed)
Pt says she spoke to [facility] regarding Ambien and they told her that her there not any restrictions on that med where the MG's could not be increased so pt wants to talk to Dr. Tomi Bamberger or Verdene Lennert to find out why her Ambien can not be increased since it works so much better for her

## 2015-06-29 NOTE — Telephone Encounter (Signed)
Spoke with patient, she still feels that 10mg  would be best for her, even 7.5mg . She states that all people are not alike. Anyhow, she cannot remember why she got off of the Ambien CR 6.25, wants to know if we can re-try, if too expensive she will let you know. As far as the Belsomra she is NOT willing to spend the $100 per month even though it did work. I did check with the rep when she was here and unfortunately her plan is one of the ones that will not cover this med.

## 2015-07-07 ENCOUNTER — Other Ambulatory Visit: Payer: Self-pay | Admitting: Family Medicine

## 2015-07-07 NOTE — Telephone Encounter (Signed)
Is this ok?

## 2015-07-12 ENCOUNTER — Ambulatory Visit (INDEPENDENT_AMBULATORY_CARE_PROVIDER_SITE_OTHER): Payer: Medicare Other | Admitting: *Deleted

## 2015-07-12 DIAGNOSIS — Z5181 Encounter for therapeutic drug level monitoring: Secondary | ICD-10-CM

## 2015-07-12 DIAGNOSIS — I4891 Unspecified atrial fibrillation: Secondary | ICD-10-CM | POA: Diagnosis not present

## 2015-07-12 LAB — POCT INR: INR: 2.2

## 2015-07-18 DIAGNOSIS — H43812 Vitreous degeneration, left eye: Secondary | ICD-10-CM | POA: Diagnosis not present

## 2015-07-25 DIAGNOSIS — E041 Nontoxic single thyroid nodule: Secondary | ICD-10-CM | POA: Diagnosis not present

## 2015-07-25 DIAGNOSIS — E039 Hypothyroidism, unspecified: Secondary | ICD-10-CM | POA: Diagnosis not present

## 2015-07-25 DIAGNOSIS — I10 Essential (primary) hypertension: Secondary | ICD-10-CM | POA: Diagnosis not present

## 2015-07-25 DIAGNOSIS — M899 Disorder of bone, unspecified: Secondary | ICD-10-CM | POA: Diagnosis not present

## 2015-07-26 ENCOUNTER — Other Ambulatory Visit: Payer: Self-pay | Admitting: Endocrinology

## 2015-07-26 DIAGNOSIS — E041 Nontoxic single thyroid nodule: Secondary | ICD-10-CM

## 2015-07-29 ENCOUNTER — Ambulatory Visit (INDEPENDENT_AMBULATORY_CARE_PROVIDER_SITE_OTHER): Payer: Medicare Other

## 2015-07-29 DIAGNOSIS — Z5181 Encounter for therapeutic drug level monitoring: Secondary | ICD-10-CM | POA: Diagnosis not present

## 2015-07-29 DIAGNOSIS — I4891 Unspecified atrial fibrillation: Secondary | ICD-10-CM | POA: Diagnosis not present

## 2015-07-29 LAB — POCT INR: INR: 2.4

## 2015-08-06 ENCOUNTER — Other Ambulatory Visit: Payer: Self-pay | Admitting: Cardiology

## 2015-08-18 ENCOUNTER — Other Ambulatory Visit: Payer: Self-pay | Admitting: Family Medicine

## 2015-08-18 NOTE — Telephone Encounter (Signed)
Are these okay? Did not know when she should follow up.

## 2015-08-22 ENCOUNTER — Other Ambulatory Visit: Payer: Self-pay | Admitting: Family Medicine

## 2015-08-26 ENCOUNTER — Ambulatory Visit (INDEPENDENT_AMBULATORY_CARE_PROVIDER_SITE_OTHER): Payer: Medicare Other | Admitting: *Deleted

## 2015-08-26 DIAGNOSIS — Z5181 Encounter for therapeutic drug level monitoring: Secondary | ICD-10-CM | POA: Diagnosis not present

## 2015-08-26 DIAGNOSIS — I4891 Unspecified atrial fibrillation: Secondary | ICD-10-CM

## 2015-08-26 LAB — POCT INR: INR: 2.7

## 2015-08-29 ENCOUNTER — Other Ambulatory Visit: Payer: Self-pay | Admitting: Family Medicine

## 2015-08-29 NOTE — Telephone Encounter (Signed)
Patient advised and verbalized understanding 

## 2015-08-29 NOTE — Telephone Encounter (Signed)
Spoke with patient and what she has been doing is taking 1/2 Lunesta tablet to make her sleepy and them a little while later she takes the [place] to help keep her asleep. I told her that she cannot be taking both of these meds. She said its the only thing that works. She said taking 2 tylenol PM prior to the [place] helps somewhat but she does not want to take the tylenol PM on a regular basis. Please advise. Thanks.

## 2015-08-29 NOTE — Telephone Encounter (Signed)
Is this okay to call in? 

## 2015-08-29 NOTE — Telephone Encounter (Signed)
Looks like we refilled her Lorrin Mais CR the end of June with 2 refills.  She shouldn't be taking both

## 2015-08-29 NOTE — Telephone Encounter (Signed)
It is not safe and not indicated to use these two medications together. I would rather her take diphenhydramine (the PM portion of tylenol PM) on a regular basis, along with the [place], if needed.  This is an allergy medication that would be safe to use, but cutting out the tylenol that if used in excess could bother the liver.Lisa Lucero.  If she has ongoing issues with her sleep, needs either OV to discuss or referral to someone for sleep (consider behavioral options to help sleep, etc).

## 2015-09-05 ENCOUNTER — Other Ambulatory Visit: Payer: Self-pay | Admitting: Family Medicine

## 2015-09-07 ENCOUNTER — Telehealth: Payer: Self-pay

## 2015-09-07 ENCOUNTER — Other Ambulatory Visit: Payer: Self-pay | Admitting: Family Medicine

## 2015-09-07 MED ORDER — DILTIAZEM HCL ER COATED BEADS 240 MG PO CP24: 240.0000 mg | ORAL_CAPSULE | Freq: Every day | ORAL | Status: DC

## 2015-09-07 NOTE — Telephone Encounter (Signed)
error 

## 2015-09-14 ENCOUNTER — Telehealth: Payer: Self-pay | Admitting: Family Medicine

## 2015-09-14 NOTE — Telephone Encounter (Signed)
Spoke with [facility] and gave them the info that they needed so that they can send  Med out to patient. Called pt to advise that everything is taken care of.

## 2015-09-14 NOTE — Telephone Encounter (Signed)
Pt called stating that [facility] told her that our office need to contact them regarding her Diltiazem refill before she can get it and use reference # [id number] when we call. Called [facility] and they state that we sent a refill auth back to them by fax stating that we have never gave this med to the patient and/or the patient is not a patient in our practice. I show that we auth a refill on 09/07/15 and [facility] show that the denial was faxed on 09/06/15 so they asked me to give them a verbal auth of what was authorized on this med however the call was dropped after being on hold foe 27mins. Will call them back.

## 2015-09-23 ENCOUNTER — Ambulatory Visit (INDEPENDENT_AMBULATORY_CARE_PROVIDER_SITE_OTHER): Payer: Medicare Other | Admitting: *Deleted

## 2015-09-23 DIAGNOSIS — Z5181 Encounter for therapeutic drug level monitoring: Secondary | ICD-10-CM

## 2015-09-23 DIAGNOSIS — I4891 Unspecified atrial fibrillation: Secondary | ICD-10-CM

## 2015-09-23 LAB — POCT INR: INR: 1.3

## 2015-09-26 ENCOUNTER — Other Ambulatory Visit: Payer: Self-pay | Admitting: Family Medicine

## 2015-09-26 NOTE — Telephone Encounter (Signed)
Ok for #30 with 2 refills 

## 2015-09-26 NOTE — Telephone Encounter (Signed)
Is this okay to refill? 

## 2015-09-30 ENCOUNTER — Ambulatory Visit (INDEPENDENT_AMBULATORY_CARE_PROVIDER_SITE_OTHER): Payer: Medicare Other

## 2015-09-30 DIAGNOSIS — Z5181 Encounter for therapeutic drug level monitoring: Secondary | ICD-10-CM

## 2015-09-30 DIAGNOSIS — I4891 Unspecified atrial fibrillation: Secondary | ICD-10-CM

## 2015-09-30 LAB — POCT INR: INR: 2.3

## 2015-10-03 ENCOUNTER — Telehealth: Payer: Self-pay | Admitting: Family Medicine

## 2015-10-03 NOTE — Telephone Encounter (Signed)
Pt says that Gabapentin is not helping with her nerve pain and Tylenol or Aleve does not help at all so she wants to know if Dr Tomi Bamberger can give he something else for the nerve pain or does she need to go to a neurologist? Pt took 6 Gabapentin yesterday.

## 2015-10-03 NOTE — Telephone Encounter (Signed)
She was last seen in May.  She needs to schedule an office visit to discuss next step and any prescriptions.

## 2015-10-04 NOTE — Telephone Encounter (Signed)
Pt made appt for 10/05/15

## 2015-10-05 ENCOUNTER — Encounter: Payer: Self-pay | Admitting: Family Medicine

## 2015-10-05 ENCOUNTER — Ambulatory Visit (INDEPENDENT_AMBULATORY_CARE_PROVIDER_SITE_OTHER): Payer: Medicare Other | Admitting: Family Medicine

## 2015-10-05 VITALS — BP 130/80 | HR 80 | Ht 66.0 in | Wt 129.8 lb

## 2015-10-05 DIAGNOSIS — I10 Essential (primary) hypertension: Secondary | ICD-10-CM | POA: Diagnosis not present

## 2015-10-05 DIAGNOSIS — M5416 Radiculopathy, lumbar region: Secondary | ICD-10-CM | POA: Diagnosis not present

## 2015-10-05 DIAGNOSIS — Z23 Encounter for immunization: Secondary | ICD-10-CM

## 2015-10-05 DIAGNOSIS — J309 Allergic rhinitis, unspecified: Secondary | ICD-10-CM | POA: Diagnosis not present

## 2015-10-05 MED ORDER — METOPROLOL TARTRATE 50 MG PO TABS: 50.0000 mg | ORAL_TABLET | Freq: Two times a day (BID) | ORAL | Status: DC

## 2015-10-05 NOTE — Progress Notes (Signed)
Chief Complaint  Patient presents with  . Advice Only    has been having a lot of dizziness, facial swelling and matter in her eyes in the morning-feels like she is aging rapidly. She has been using ear drops-wonder if she has an inner ear imbalance or medication issue. Also would like a handicapped placard for back pain. Also wants referral for PT for back pain ([facility] near her house)  . Medication Refill    needs 90 day refill of metoprolol to [facility].    She reports that her BP at home has been running high.  She didn't bring in a list, but recalls 150/100. She isn't sure if her monitor is accurate. She didn't bring it with her today. Denies headaches. Denies shortness of breath or chest pain. Denies tachycardia or shortness of breath.  She feels a little dizzy when she wakes up, and felt a little off balance on her walk today, wished she took a cane with her.  Feels like her equilibrium is a little off, not any lightheadedness or pre-syncope. She is having some sinus congestion, and has noticed "matter in her eyes" and some puffiness below her eyes, mostly in the morning over the last few days.  Eyes aren't itching. They are watering some, and slightly red.  Denies crusting shut.  Denies any new eye products, cleansers At her last visit with me, in May, she was also complaining of dizziness/equilibrium problems, runny nose, facial puffiness, etc (see last visit).  She was told to take claritin at [facility] at that time, and she felt like it was helpful. She hasn't been taking it recently. She has some chronic runny nose.  She is complaining of pain at her right lower back, that radiates down the right leg, sometimes all the way down to her toes.  Sometimes she even has pain on the left, but usually on the right.  Denies any weakness in the right leg. She describes the pain as a burning pain.The pain recurred a couple of weeks ago, possibly related to walking more. She has been taking  tylenol, some Aleve. She had back surgery a year ago by Dr. Lorin Mercy.  She hasn't seen him for months. She is asking about PT near her house.  Per last visit with [place] here in May, she was told to stop the gabapentin (she was taking $RemoveBe'200mg'mJfgkRXNK$  qHS and it wasn't helping with her pain (vaginal pain, not back pain at that time), and to restart Lyrica at bedtime.  She only has vaginal pain now if the back pain gets really bad. She doesn't recall when or why she stopped Lyrica.  When her pain was bad the other day, she took up to 6 gabapentin, and didn't get much relief.  Current pain level is 3/10  PMH, PSH, SH reviewed. Outpatient Encounter Prescriptions as of 10/05/2015  Medication Sig Note  . calcium carbonate (TUMS - DOSED IN MG ELEMENTAL CALCIUM) 500 MG chewable tablet Chew 1 tablet by mouth 2 (two) times daily.   Marland Kitchen diltiazem (CARDIZEM CD) 240 MG 24 hr capsule Take 1 capsule (240 mg total) by mouth daily.   Marland Kitchen FERREX 150 150 MG capsule TAKE ONE CAPSULE BY MOUTH EVERY DAY   . gabapentin (NEURONTIN) 100 MG capsule Take 2 capsules by mouth at bedtime. 10/05/2015: Mostly taking 2 at bedtime  . hydrochlorothiazide (HYDRODIURIL) 25 MG tablet Take 12.5 mg by mouth every other day.   . ipratropium (ATROVENT) 0.06 % nasal spray USE 2  SPRAYS IN EACH NOSTRIL FOUR TIMES A DAY   . levothyroxine (SYNTHROID, LEVOTHROID) 25 MCG tablet Take 25 mcg by mouth daily before breakfast.   . lisinopril (PRINIVIL,ZESTRIL) 20 MG tablet TAKE 1 TABLET TWICE A DAY   . Magnesium 500 MG TABS Take 1 tablet by mouth daily. 03/30/2015: Pt would like to know best magnesium supplement.  . metoprolol (LOPRESSOR) 50 MG tablet Take 1 tablet (50 mg total) by mouth 2 (two) times daily.   . Multiple Vitamin (MULITIVITAMIN WITH MINERALS) TABS Take 1 tablet by mouth daily.   . pantoprazole (PROTONIX) 40 MG tablet Take 1 tablet (40 mg total) by mouth daily.   . potassium chloride (K-DUR,KLOR-CON) 10 MEQ tablet Take 1 tablet (10 mEq total) by mouth 2  (two) times daily.   . pravastatin (PRAVACHOL) 20 MG tablet TAKE 1 TABLET (20 MG TOTAL) BY MOUTH EVERY EVENING.   Marland Kitchen warfarin (COUMADIN) 5 MG tablet Take 0.5-1 tablets (2.5-5 mg total) by mouth daily at 6 PM. 2.5 mg on Mon, Wed, Fri and 5 mg all other days   . zolpidem (AMBIEN CR) 6.25 MG CR tablet TAKE 1 TABLET BY MOUTH AT BEDTIME   . [DISCONTINUED] ketotifen (ZADITOR) 0.025 % ophthalmic solution Place 1 drop into both eyes daily as needed (dry eyes).   . [DISCONTINUED] metoprolol (LOPRESSOR) 50 MG tablet Take 50 mg by mouth 2 (two) times daily.     . dibucaine (NUPERCAINAL) 1 % ointment Apply 1 application topically 3 (three) times daily as needed for pain (for vaginal discomfort).   . Lidocaine-Hydrocortisone Ace 2-2 % KIT Place 1 suppository rectally as needed (itching).  05/11/2015: Not needing this now  . nitroGLYCERIN (NITROSTAT) 0.4 MG SL tablet Place 1 tablet (0.4 mg total) under the tongue every 5 (five) minutes as needed for chest pain. (Patient not taking: Reported on 10/05/2015)   . pregabalin (LYRICA) 50 MG capsule Take 1 capsule (50 mg total) by mouth at bedtime. (Patient not taking: Reported on 10/05/2015)   . [DISCONTINUED] eszopiclone (LUNESTA) 2 MG TABS tablet Take 1 tablet (2 mg total) by mouth at bedtime as needed for sleep. Take immediately before bedtime (Patient not taking: Reported on 10/05/2015) 10/05/2015: Changed to Ambien CR  . [DISCONTINUED] gabapentin (NEURONTIN) 100 MG capsule Take 2 capsules qHS.  May increase to 3 caps QHS if needed   . [DISCONTINUED] ILEVRO 0.3 % SUSP  03/30/2015: On hold until cataract surgery.  . [DISCONTINUED] Suvorexant 15 MG TABS Take 1 tablet by mouth daily as needed (sleep).   . [DISCONTINUED] zolpidem (AMBIEN) 5 MG tablet TAKE 1 TABLET BY MOUTH AT BEDTIME AS NEEDED FOR SLEEP (Patient not taking: Reported on 10/05/2015) 03/23/2015: Takes daily   No facility-administered encounter medications on file as of 10/05/2015.   Allergies  Allergen Reactions   . Celebrex [Celecoxib] Diarrhea  . Lipitor [Atorvastatin Calcium] Other (See Comments)    myalgias   ROS:  Denies fever, chills, syncope, tremor, numbness, tingling, chest pain, palpitations, shortness of breath, edema, bleeding, depression. Insomnia is controlled with Ambien CR.  +back pain and allergy symptoms as per HPI. No urinary complaints.  PHYSICAL EXAM: BP 150/108 mmHg  Pulse 80  Ht _0  (1.676 m)  Wt 129 lb 12.8 oz (58.877 kg)  BMI 20.96 kg/m2 130/80 on repeat by MD  Pleasant, elderly female in no distress HEENT: PERRL, EOMI, conjunctiva is clear. No crusting or purulence is noted.  Nasal mucosa is mildly edematous, pale; Sinuses nontender OP is normal Neck: no  lymphadenopathy, carotid bruit or mass Heart: fairly regular, no murmur Lungs: clear bilaterally Back: no spinal tenderness, CVA or SI tenderness or muscle spasm. Neuro: alert, cranial nerves intact. Normal strength, gait, DTR's are 2+ and symmetric Negative SLR Skin: no rashes, normal turgor Psych: normal mood, affect, hygiene and grooming  ASSESSMENT/PLAN:  Lumbar radiculopathy - refer for PT. tolerating gabapentin, not effective at current dose. may increase to 100/100/300, cut back if sedating. F/U with Dr. Lorin Mercy if not improving  Need for prophylactic vaccination and inoculation against influenza - Plan: Flu vaccine HIGH DOSE PF (Fluzone High dose)  Essential hypertension - controlled. to bring monitor to her next visit to have it checked  - Plan: metoprolol (LOPRESSOR) 50 MG tablet  Allergic rhinitis, unspecified allergic rhinitis type - daily claritin, and add flonase if not improved   Refer to PT [facility], on [address]) If pain isn't improving, then follow up with Dr. Lorin Mercy. No handicap placard--hoping pain can be improved. If not, Dr. Lorin Mercy can give her.  Don't take any aleve--you aren't supposed to take anti-inflammatories when taking coumadin/warfarin. You may safely continue to take  tylenol as needed for pain--don't take more than it says on the bottle. Continue the gabapentin at 2 at bedtime. If your pain gets worse, you can increase to 3 at bedtime, and then start just 1 tablet in the morning and 1 tablet at mid-day.  If these daytime tablets cause significant sedation or fogginess in your thinking, then stop the daytime doses, and just continue 3 at bedtime. If your pain is improving with physical therapy, then you can back down to just 2 at bedtime.  If you aren't improving with physical therapy, call Dr. Lorin Mercy and schedule a follow-up appointment with him.  Restart taking loratidine (claritin) once daily.  If this doesn't help with your eye symptoms or runny nose, then also restart using Flonase every day. This should also help your eyes.   She declines using topical meds--reports too temporary.

## 2015-10-05 NOTE — Patient Instructions (Signed)
  Don't take any aleve--you aren't supposed to take anti-inflammatories when taking coumadin/warfarin. You may safely continue to take tylenol as needed for pain--don't take more than it says on the bottle. Continue the gabapentin at 2 at bedtime. If your pain gets worse, you can increase to 3 at bedtime, and then start just 1 tablet in the morning and 1 tablet at mid-day.  If these daytime tablets cause significant sedation or fogginess in your thinking, then stop the daytime doses, and just continue 3 at bedtime. If your pain is improving with physical therapy, then you can back down to just 2 at bedtime.  If you aren't improving with physical therapy, call Dr. Lorin Mercy and schedule a follow-up appointment with him.  Restart taking loratidine (claritin) once daily.  If this doesn't help with your eye symptoms or runny nose, then also restart using Flonase every day. This should also help your eyes.

## 2015-10-10 ENCOUNTER — Other Ambulatory Visit: Payer: Self-pay | Admitting: Cardiology

## 2015-10-10 DIAGNOSIS — M5416 Radiculopathy, lumbar region: Secondary | ICD-10-CM | POA: Diagnosis not present

## 2015-10-11 ENCOUNTER — Other Ambulatory Visit: Payer: Self-pay | Admitting: *Deleted

## 2015-10-11 MED ORDER — WARFARIN SODIUM 5 MG PO TABS: ORAL_TABLET | ORAL | Status: DC

## 2015-10-11 NOTE — Telephone Encounter (Signed)
Refill done as requested 

## 2015-10-11 NOTE — Telephone Encounter (Signed)
Please review for refill, Thank you. 

## 2015-10-12 DIAGNOSIS — M5416 Radiculopathy, lumbar region: Secondary | ICD-10-CM | POA: Diagnosis not present

## 2015-10-17 ENCOUNTER — Other Ambulatory Visit: Payer: Self-pay | Admitting: Family Medicine

## 2015-10-17 DIAGNOSIS — M5416 Radiculopathy, lumbar region: Secondary | ICD-10-CM | POA: Diagnosis not present

## 2015-10-21 ENCOUNTER — Ambulatory Visit (INDEPENDENT_AMBULATORY_CARE_PROVIDER_SITE_OTHER): Payer: Medicare Other | Admitting: *Deleted

## 2015-10-21 DIAGNOSIS — I4891 Unspecified atrial fibrillation: Secondary | ICD-10-CM

## 2015-10-21 DIAGNOSIS — Z5181 Encounter for therapeutic drug level monitoring: Secondary | ICD-10-CM

## 2015-10-21 DIAGNOSIS — M5416 Radiculopathy, lumbar region: Secondary | ICD-10-CM | POA: Diagnosis not present

## 2015-10-21 LAB — POCT INR: INR: 3

## 2015-10-24 DIAGNOSIS — M5416 Radiculopathy, lumbar region: Secondary | ICD-10-CM | POA: Diagnosis not present

## 2015-10-28 DIAGNOSIS — M5416 Radiculopathy, lumbar region: Secondary | ICD-10-CM | POA: Diagnosis not present

## 2015-10-31 DIAGNOSIS — M5416 Radiculopathy, lumbar region: Secondary | ICD-10-CM | POA: Diagnosis not present

## 2015-11-01 ENCOUNTER — Other Ambulatory Visit: Payer: Self-pay | Admitting: Family Medicine

## 2015-11-02 DIAGNOSIS — M5416 Radiculopathy, lumbar region: Secondary | ICD-10-CM | POA: Diagnosis not present

## 2015-11-04 DIAGNOSIS — M5416 Radiculopathy, lumbar region: Secondary | ICD-10-CM | POA: Diagnosis not present

## 2015-11-07 DIAGNOSIS — M5416 Radiculopathy, lumbar region: Secondary | ICD-10-CM | POA: Diagnosis not present

## 2015-11-14 ENCOUNTER — Ambulatory Visit (INDEPENDENT_AMBULATORY_CARE_PROVIDER_SITE_OTHER): Payer: Medicare Other | Admitting: Family Medicine

## 2015-11-14 ENCOUNTER — Encounter: Payer: Self-pay | Admitting: Family Medicine

## 2015-11-14 VITALS — BP 120/90 | HR 60 | Ht 66.0 in | Wt 127.0 lb

## 2015-11-14 DIAGNOSIS — E039 Hypothyroidism, unspecified: Secondary | ICD-10-CM | POA: Diagnosis not present

## 2015-11-14 DIAGNOSIS — I4891 Unspecified atrial fibrillation: Secondary | ICD-10-CM | POA: Diagnosis not present

## 2015-11-14 DIAGNOSIS — M792 Neuralgia and neuritis, unspecified: Secondary | ICD-10-CM | POA: Diagnosis not present

## 2015-11-14 DIAGNOSIS — I10 Essential (primary) hypertension: Secondary | ICD-10-CM | POA: Diagnosis not present

## 2015-11-14 DIAGNOSIS — Z5181 Encounter for therapeutic drug level monitoring: Secondary | ICD-10-CM

## 2015-11-14 DIAGNOSIS — Z7901 Long term (current) use of anticoagulants: Secondary | ICD-10-CM

## 2015-11-14 LAB — CBC WITH DIFFERENTIAL/PLATELET
BASOS PCT: 1 % (ref 0–1)
Basophils Absolute: 0.1 10*3/uL (ref 0.0–0.1)
Eosinophils Absolute: 0.5 10*3/uL (ref 0.0–0.7)
Eosinophils Relative: 7 % — ABNORMAL HIGH (ref 0–5)
HEMATOCRIT: 44.7 % (ref 36.0–46.0)
HEMOGLOBIN: 14.6 g/dL (ref 12.0–15.0)
LYMPHS PCT: 22 % (ref 12–46)
Lymphs Abs: 1.6 10*3/uL (ref 0.7–4.0)
MCH: 29.6 pg (ref 26.0–34.0)
MCHC: 32.7 g/dL (ref 30.0–36.0)
MCV: 90.7 fL (ref 78.0–100.0)
MPV: 9.5 fL (ref 8.6–12.4)
Monocytes Absolute: 0.8 10*3/uL (ref 0.1–1.0)
Monocytes Relative: 11 % (ref 3–12)
NEUTROS ABS: 4.2 10*3/uL (ref 1.7–7.7)
NEUTROS PCT: 59 % (ref 43–77)
Platelets: 241 10*3/uL (ref 150–400)
RBC: 4.93 MIL/uL (ref 3.87–5.11)
RDW: 15 % (ref 11.5–15.5)
WBC: 7.1 10*3/uL (ref 4.0–10.5)

## 2015-11-14 LAB — TSH: TSH: 0.616 u[IU]/mL (ref 0.350–4.500)

## 2015-11-14 MED ORDER — PREDNISONE 20 MG PO TABS: 20.0000 mg | ORAL_TABLET | Freq: Two times a day (BID) | ORAL | Status: DC

## 2015-11-14 NOTE — Patient Instructions (Signed)
We will try and get records from Dr. Chalmers Cater.  If/when refills are needed, have the pharmacy contact us, so we can be sure to have the correct dosage of medication. We are checking your thyroid today.  Take prednisone twice daily for 5 days. This should help decrease any inflammation in the nerves and help with your pain in your leg/back as well as the vaginal and rectal pain.  You may continue to use tylenol along with your other medications, if needed for pain.  If you have any ongoing pain (into the right leg or continued vaginal/rectal pain) then let's increase the gabapentin dose.  Try taking 3 capsules together in the evening (rather than splitting the 6pm and bedtime).  Also try taking 1 capsule in the morning, and 1 mid-day.  If you are too sleepy from the daytime doses, then stop them.  They are such a low dose, you might do fine using these during the day.  Eventually, if you have ongoing pain, and this medication doesn't make you sleepy or groggy during the day, we may be able to gradually increase the dose. For now--100mg  (1 capsule) in the morning, 1 mid-day and 3 at bedtime (but cut back if you have a lot of side effects--sedation, grogginess, etc).  If you have persistent/ongoing pain, follow up with Dr. Lorin Mercy.

## 2015-11-14 NOTE — Progress Notes (Signed)
Chief Complaint  Patient presents with  . Hypertension    nonfasting med check. Over the past few days has been experiencing severe vaginal/rectal burning and pain-thinks this may be related to her neuropathy.    She didn't bring her list of BP's with her, but she did bring her wrist monitor. She reports that her averages has been 130/90.  Memory in her machine has many in the 130-140's/80-90's. There were many in the 115/70's but she couldn't tell me if anyone else had used her monitor ("maybe my kids"). Denies any headaches. Sometimes has dizziness in the mornings, better by the afternoons. Denies any allergy symptoms.  She has been getting PT for her back pain. She finished it last week.  Her endurance improved, helped strengthen her muscles.  She feels like her pain in her low back got better, but the pain into her right leg (radiculopathy) is worse, like the nerve got inflamed (down her right leg). The sharp pain in the right lower back is much better.  Starting Friday, she has had recurrent vaginal and rectal pain.  She describes it as a burning pain, worse with sitting or any pressure on the area. Denies vaginal discharge, constipation, diarrhea or hemorrhoids. Vagisil and Rectocare helps temporarily. She reports that the skin looked very red. Currently complaining of 8/10 vaginal discomfort  Hypothyroidism--she sees Dr. Chalmers Cater once a year. She wonders if she needs to go back. She believes her dose may have been changed at her last visit (early summer?).  No records were received from Dr. Chalmers Cater. She denies any thyroid-related symptoms.  PMH, PSH, SH reviewed.  Outpatient Encounter Prescriptions as of 11/14/2015  Medication Sig Note  . calcium carbonate (TUMS - DOSED IN MG ELEMENTAL CALCIUM) 500 MG chewable tablet Chew 1 tablet by mouth 2 (two) times daily.   Marland Kitchen diltiazem (CARDIZEM CD) 240 MG 24 hr capsule Take 1 capsule (240 mg total) by mouth daily.   Marland Kitchen FERREX 150 150 MG capsule TAKE  ONE CAPSULE BY MOUTH EVERY DAY   . gabapentin (NEURONTIN) 100 MG capsule Take 2 capsules by mouth at bedtime. 11/14/2015: Taking 2 at 6pm, and another 1 capsule at bedtime  . hydrochlorothiazide (HYDRODIURIL) 25 MG tablet Take 12.5 mg by mouth every other day.   . ipratropium (ATROVENT) 0.06 % nasal spray USE 2 SPRAYS IN EACH NOSTRIL FOUR TIMES A DAY   . KLOR-CON M10 10 MEQ tablet TAKE 1 TABLET TWICE A DAY   . levothyroxine (SYNTHROID, LEVOTHROID) 25 MCG tablet Take 25 mcg by mouth daily before breakfast.   . Lidocaine-Hydrocortisone Ace 2-2 % KIT Place 1 suppository rectally as needed (itching).  05/11/2015: Not needing this now  . lisinopril (PRINIVIL,ZESTRIL) 20 MG tablet TAKE 1 TABLET TWICE A DAY   . Magnesium 500 MG TABS Take 1 tablet by mouth daily. 03/30/2015: Pt would like to know best magnesium supplement.  . metoprolol (LOPRESSOR) 50 MG tablet Take 1 tablet (50 mg total) by mouth 2 (two) times daily.   . Multiple Vitamin (MULITIVITAMIN WITH MINERALS) TABS Take 1 tablet by mouth daily.   . pantoprazole (PROTONIX) 40 MG tablet Take 1 tablet (40 mg total) by mouth daily.   . pravastatin (PRAVACHOL) 20 MG tablet TAKE 1 TABLET (20 MG TOTAL) BY MOUTH EVERY EVENING.   Marland Kitchen warfarin (COUMADIN) 5 MG tablet Take as directed by coumadin clinic   . zolpidem (AMBIEN CR) 6.25 MG CR tablet TAKE 1 TABLET BY MOUTH AT BEDTIME   . [DISCONTINUED] nitroGLYCERIN (  NITROSTAT) 0.4 MG SL tablet Place 1 tablet (0.4 mg total) under the tongue every 5 (five) minutes as needed for chest pain.   Marland Kitchen dibucaine (NUPERCAINAL) 1 % ointment Apply 1 application topically 3 (three) times daily as needed for pain (for vaginal discomfort).   . predniSONE (DELTASONE) 20 MG tablet Take 1 tablet (20 mg total) by mouth 2 (two) times daily with a meal.   . [DISCONTINUED] hydrochlorothiazide (HYDRODIURIL) 25 MG tablet TAKE ONE-HALF (1/2) TABLET EVERY OTHER DAY AS DIRECTED   . [DISCONTINUED] pregabalin (LYRICA) 50 MG capsule Take 1 capsule  (50 mg total) by mouth at bedtime. (Patient not taking: Reported on 10/05/2015)    No facility-administered encounter medications on file as of 11/14/2015.   (prednisone rx'd today, not prior to visit).  Allergies  Allergen Reactions  . Celebrex [Celecoxib] Diarrhea  . Lipitor [Atorvastatin Calcium] Other (See Comments)    myalgias   ROS: no headaches, dizziness, URI symptoms, chest pain, shortness of breath, GI or GU complaints (see above), denies incontinence. No bleeding, bruising. +radiculopathy and improvement of back pain as per HPI.  No fever, chills, edema  PHYSICAL EXAM: BP 120/90 mmHg  Pulse 60  Ht _0  (1.676 m)  Wt 127 lb (57.607 kg)  BMI 20.51 kg/m2 Pt's monitor read 145/100 around the same time nurse checked it. Checked it again while MD in room--140/105 with her monitor when holding her arm out. When resting her arm against her chest (heart height) BP was 134/105. 130/90 by me--heartbeat is very irregular (irregularly irregular) and likely her monitor has a hard time registering it correctly  Well appearing female, thin, appears comfortable, and in no distress HEENT: PERRL EOMI, conjunctiva clear. OP clear Neck: No lymphadenopathy, thyromegaly or mass Heart: Irregularly irregular, normal rate. No murmur Lungs: clear bilaterally Abdomen: soft, nontender, no mass Back: no spinal tenderness or SI tenderness. No muscle spasm. nontender at sciatic notch External genitalia:  Normal--no lesions, erythema, rash Neuro: normal strength, sensation, DTR, negative SLR. Cranial nerves intact. Normal gait Psych: normal mood, affect, hygiene and grooming Extremities: no edema, 2+ pulses  ASSESSMENT/PLAN:  Neuropathic pain - radiculopathy into RLE, and recurrence of recto-vaginal pain felt to be neuropathic. Short steroid course (can't use NSAIDs, on coumadin) - Plan: predniSONE (DELTASONE) 20 MG tablet  Hypothyroidism, unspecified hypothyroidism type - euthyroid by hx. Records  from Dr. Chalmers Cater - Plan: TSH  Monitoring for anticoagulant use - Plan: CBC with Differential/Platelet  Atrial fibrillation, unspecified type ([place]) - rate controlled; anticoagulated  Essential hypertension, benign - borderline diastolics. Her monitor reads high for this. no change in meds.   She has future orders in system for Dr. Mare Ferrari for December for b-met, lft's and lipids Last TSH (in system) 11/2014 (may have had it checked by Dr Chalmers Cater since then. Normal CBC in March  CBC and TSH today. Pt to sign release form for records from Dr. Chalmers Cater.  If/when refills are needed, have the pharmacy contact us, so we can be sure to have the correct dosage of medication.  Take prednisone twice daily for 5 days. This should help decrease any inflammation in the nerves and help with your pain in your leg/back as well as the vaginal and rectal pain.  You may continue to use tylenol along with your other medications, if needed for pain.  If you have any ongoing pain (into the right leg or continued vaginal/rectal pain) then let's increase the gabapentin dose.  Try taking 3 capsules together in the evening (  rather than splitting the 6pm and bedtime).  Also try taking 1 capsule in the morning, and 1 mid-day.  If you are too sleepy from the daytime doses, then stop them.  They are such a low dose, you might do fine using these during the day.  Eventually, if you have ongoing pain, and this medication doesn't make you sleepy or groggy during the day, we may be able to gradually increase the dose. For now--135m (1 capsule) in the morning, 1 mid-day and 3 at bedtime (but cut back if you have a lot of side effects--sedation, grogginess, etc).  If you have persistent/ongoing pain, follow up with Dr. YLorin Mercy  Risks/side effects of prednisone were reviewed.

## 2015-11-15 DIAGNOSIS — I10 Essential (primary) hypertension: Secondary | ICD-10-CM | POA: Insufficient documentation

## 2015-11-21 ENCOUNTER — Ambulatory Visit (INDEPENDENT_AMBULATORY_CARE_PROVIDER_SITE_OTHER): Payer: Medicare Other | Admitting: Pharmacist

## 2015-11-21 DIAGNOSIS — Z5181 Encounter for therapeutic drug level monitoring: Secondary | ICD-10-CM

## 2015-11-21 DIAGNOSIS — I4891 Unspecified atrial fibrillation: Secondary | ICD-10-CM | POA: Diagnosis not present

## 2015-11-21 LAB — POCT INR: INR: 3.5

## 2015-11-22 ENCOUNTER — Other Ambulatory Visit: Payer: Self-pay | Admitting: Family Medicine

## 2015-11-23 ENCOUNTER — Other Ambulatory Visit: Payer: Self-pay | Admitting: Family Medicine

## 2015-12-03 ENCOUNTER — Other Ambulatory Visit: Payer: Self-pay | Admitting: Family Medicine

## 2015-12-06 ENCOUNTER — Ambulatory Visit (INDEPENDENT_AMBULATORY_CARE_PROVIDER_SITE_OTHER): Payer: Medicare Other | Admitting: Cardiology

## 2015-12-06 ENCOUNTER — Encounter: Payer: Self-pay | Admitting: *Deleted

## 2015-12-06 ENCOUNTER — Encounter: Payer: Self-pay | Admitting: Cardiology

## 2015-12-06 VITALS — BP 142/80 | HR 68 | Ht 66.0 in | Wt 125.0 lb

## 2015-12-06 DIAGNOSIS — I10 Essential (primary) hypertension: Secondary | ICD-10-CM | POA: Diagnosis not present

## 2015-12-06 DIAGNOSIS — I480 Paroxysmal atrial fibrillation: Secondary | ICD-10-CM

## 2015-12-06 DIAGNOSIS — E785 Hyperlipidemia, unspecified: Secondary | ICD-10-CM

## 2015-12-06 DIAGNOSIS — I4891 Unspecified atrial fibrillation: Secondary | ICD-10-CM

## 2015-12-06 DIAGNOSIS — Z01812 Encounter for preprocedural laboratory examination: Secondary | ICD-10-CM

## 2015-12-06 LAB — BASIC METABOLIC PANEL
BUN: 22 mg/dL (ref 7–25)
CHLORIDE: 95 mmol/L — AB (ref 98–110)
CO2: 28 mmol/L (ref 20–31)
Calcium: 9.7 mg/dL (ref 8.6–10.4)
Creat: 1.13 mg/dL — ABNORMAL HIGH (ref 0.60–0.93)
Glucose, Bld: 89 mg/dL (ref 65–99)
POTASSIUM: 4.3 mmol/L (ref 3.5–5.3)
Sodium: 134 mmol/L — ABNORMAL LOW (ref 135–146)

## 2015-12-06 LAB — CBC WITH DIFFERENTIAL/PLATELET
Basophils Absolute: 0.1 10*3/uL (ref 0.0–0.1)
Basophils Relative: 1 % (ref 0–1)
Eosinophils Absolute: 0.2 10*3/uL (ref 0.0–0.7)
Eosinophils Relative: 3 % (ref 0–5)
HCT: 42.2 % (ref 36.0–46.0)
Hemoglobin: 13.9 g/dL (ref 12.0–15.0)
Lymphocytes Relative: 21 % (ref 12–46)
Lymphs Abs: 1.2 10*3/uL (ref 0.7–4.0)
MCH: 30.2 pg (ref 26.0–34.0)
MCHC: 32.9 g/dL (ref 30.0–36.0)
MCV: 91.5 fL (ref 78.0–100.0)
MPV: 9.3 fL (ref 8.6–12.4)
Monocytes Absolute: 0.4 10*3/uL (ref 0.1–1.0)
Monocytes Relative: 7 % (ref 3–12)
Neutro Abs: 3.9 10*3/uL (ref 1.7–7.7)
Neutrophils Relative %: 68 % (ref 43–77)
Platelets: 223 10*3/uL (ref 150–400)
RBC: 4.61 MIL/uL (ref 3.87–5.11)
RDW: 15.4 % (ref 11.5–15.5)
WBC: 5.8 10*3/uL (ref 4.0–10.5)

## 2015-12-06 MED ORDER — DILTIAZEM HCL ER COATED BEADS 120 MG PO CP24: 120.0000 mg | ORAL_CAPSULE | Freq: Every day | ORAL | Status: DC

## 2015-12-06 NOTE — Patient Instructions (Signed)
Medication Instructions:  Your physician has recommended you make the following change in your medication:  ADD DILTIAZEM  120 MG   EVERY EVENING  Labwork: TODAY  BMET   CBC  INR   Testing/Procedures: Your physician has requested that you have a TEE/Cardioversion. During a TEE, sound waves are used to create images of your heart. It provides your doctor with information about the size and shape of your heart and how well your heart's chambers and valves are working. In this test, a transducer is attached to the end of a flexible tube that is guided down you throat and into your esophagus (the tube leading from your mouth to your stomach) to get a more detailed image of your heart. Once the TEE has determined that a blood clot is not present, the cardioversion begins. Electrical Cardioversion uses a jolt of electricity to your heart either through paddles or wired patches attached to your chest. This is a controlled, usually prescheduled, procedure. This procedure is done at the hospital and you are not awake during the procedure. You usually go home the day of the procedure. Please see the instruction sheet given to you today for more information.   Follow-Up: Your physician recommends that you schedule a follow-up appointment in: 2 MONTHS  WITH  DR   Mare Ferrari  Any Other Special Instructions Will Be Listed Below (If Applicable).     If you need a refill on your cardiac medications before your next appointment, please call your pharmacy.

## 2015-12-06 NOTE — Progress Notes (Signed)
Cardiology Office Note   Date:  12/06/2015   ID:  Lisa Lucero, DOB 20-Jun-1935, MRN [id number]  PCP:  Vikki Ports, MD  Cardiologist:  Dr. Mare Ferrari    Chief Complaint  Patient presents with  . Atrial Fibrillation      History of Present Illness: Lisa Lucero is a 80 y.o. female who presents for an early appt for increased dizziness with walking or turning.  Has been going on for 2 weeks and she is feeling irregular HR at times.  No chest pain and no SOB.  She denies any bleeding in stool or urine.    She has a past history of paroxysmal atrial fibrillation. She was last hospitalized for atrial fibrillation in March of 2010. She is on long-term Coumadin. After she had gone off Coumadin and was maintaining normal sinus rhythm she was hospitalized with an embolic stroke and was placed back on long-term Coumadin. The Coumadin had to be held in October 2011 when she was admitted to [facility] with a GI bleed secondary to erosive duodenitis. She's had no further problems with her stomach since being placed on long-term proton pump inhibitor. She underwent back surgery by Dr. Lorin Mercy on 09/29/2014 And underwent surgery for her patella fracture on 10/25/2014 also by Dr. Rodell Perna.  Her last INR was 3.5 will recheck today in Sept INR was 1.3.     Past Medical History  Diagnosis Date  . Paroxysmal atrial fibrillation (HCC)   . History of embolic stroke [id number]  . Erosive gastritis     with GI bleed thought due to elevated INR and duodenitis  . Hyperthyroidism   . Peripheral edema   . Long-term (current) use of anticoagulants   . HTN (hypertension)   . Stroke ([place])   . GERD (gastroesophageal reflux disease)   . Back pain   . COPD (chronic obstructive pulmonary disease) ([place])     pt denies on 09/27/14  . H/O hypercholesterolemia     PMH: only  . Arthritis   . Anemia     Iron deficiency anemia  . Hypothyroidism   . Insomnia     prev tried Trazadone (2014); ambien CR caused hangover; on  ambien since 11/2013  . Osteoporosis   . Hypothyroidism   . H/O: GI bleed   . Osteoarthritis of both knees   . Primary osteoarthritis of both hips   . Osteoarthritis of lumbar spine   . H/O transfusion of packed red blood cells   . External hemorrhoids without complication   . Pyuria   . Postmenopausal   . Arthropathy   . Blood in stool   . Iron deficiency anemia due to chronic blood loss   . Hemorrhage of rectum and anus   . Chronic kidney disease   . CVA (cerebral vascular accident) [facility])     Past Surgical History  Procedure Laterality Date  . Hemorrhoid surgery    . Operative hysteroscopy    . Abdominal hysterectomy    . Cholecystectomy  03/11/2012    Procedure: LAPAROSCOPIC CHOLECYSTECTOMY WITH INTRAOPERATIVE CHOLANGIOGRAM;  Surgeon: Joyice Faster. Cornett, MD;  Location: [place];  Service: General;  Laterality: N/A;  . Breast enhancement surgery    . Colonoscopy    . Wisdom tooth extraction    . Lumbar laminectomy N/A 09/29/2014    Procedure: L4-5 Decompression, Hemi-Laminectomy,  Removal Free Fragment;  Surgeon: Marybelle Killings, MD;  Location: [place];  Service: Orthopedics;  Laterality: N/A;  . Orif patella Left  10/25/2014    Procedure: OPEN REDUCTION INTERNAL (ORIF) FIXATION LEFT PATELLA;  Surgeon: Marybelle Killings, MD;  Location: [place];  Service: Orthopedics;  Laterality: Left;  . Cholecystectomy    . Cyst excision  11/2014    left chest wall--nodular hidroadenoma     Current Outpatient Prescriptions  Medication Sig Dispense Refill  . calcium carbonate (TUMS - DOSED IN MG ELEMENTAL CALCIUM) 500 MG chewable tablet Chew 1 tablet by mouth 2 (two) times daily.    Marland Kitchen CARTIA XT 240 MG 24 hr capsule TAKE 1 CAPSULE DAILY 90 capsule 0  . dibucaine (NUPERCAINAL) 1 % ointment Apply 1 application topically 3 (three) times daily as needed for pain (for vaginal discomfort).    . FERREX 150 150 MG capsule TAKE ONE CAPSULE BY MOUTH EVERY DAY 30 capsule 2  . gabapentin (NEURONTIN) 100 MG capsule  Take two (2) capsules (200 mg total) by mouth daily at bedtime. May increase one (1) extra capsule (100 mg total) by mouth at bedtime as needed for pain.  2  . hydrochlorothiazide (HYDRODIURIL) 25 MG tablet Take 12.5 mg by mouth every other day.    Marland Kitchen KLOR-CON M10 10 MEQ tablet TAKE 1 TABLET TWICE A DAY 180 tablet 0  . levothyroxine (SYNTHROID, LEVOTHROID) 25 MCG tablet Take 25 mcg by mouth daily before breakfast.    . lisinopril (PRINIVIL,ZESTRIL) 20 MG tablet TAKE 1 TABLET TWICE A DAY 180 tablet 1  . Magnesium 500 MG TABS Take 1 tablet by mouth daily.    . metoprolol (LOPRESSOR) 50 MG tablet Take 1 tablet (50 mg total) by mouth 2 (two) times daily. 180 tablet 1  . Multiple Vitamin (MULITIVITAMIN WITH MINERALS) TABS Take 1 tablet by mouth daily.    . pantoprazole (PROTONIX) 40 MG tablet TAKE 1 TABLET DAILY 90 tablet 1  . pravastatin (PRAVACHOL) 20 MG tablet TAKE 1 TABLET (20 MG TOTAL) BY MOUTH EVERY EVENING. 30 tablet 5  . warfarin (COUMADIN) 5 MG tablet Take as directed by coumadin clinic 100 tablet 0  . zolpidem (AMBIEN CR) 6.25 MG CR tablet TAKE 1 TABLET BY MOUTH AT BEDTIME 30 tablet 2  . [DISCONTINUED] diphenhydrAMINE (BENADRYL) 25 MG tablet Take 25 mg by mouth every 6 (six) hours as needed.      . [DISCONTINUED] IRON PO Take 1 tablet by mouth daily.     . [DISCONTINUED] potassium chloride (K-DUR) 10 MEQ tablet Take 1 tablet (10 mEq total) by mouth 2 (two) times daily. 90 tablet 0   No current facility-administered medications for this visit.    Allergies:   Celebrex and Lipitor    Social History:  The patient  reports that she quit smoking about 5 years ago. Her smoking use included Cigarettes. She has never used smokeless tobacco. She reports that she does not drink alcohol or use illicit drugs.   Family History:  The patient's family history includes Arthritis in an other family member; Cancer in her brother; Dementia in her father; Hypertension in an other family member; Stroke in her  mother.    ROS:  General:no colds or fevers, weight down 4 lbs.  Skin:no rashes or ulcers HEENT:no blurred vision, no congestion CV:see HPI PUL:see HPI GI:no diarrhea constipation or melena, no indigestion GU:no hematuria, no dysuria MS:no joint pain, no claudication Neuro:no syncope, + lightheadedness Endo:no diabetes, no thyroid disease  Wt Readings from Last 3 Encounters:  12/06/15 125 lb (56.7 kg)  11/14/15 127 lb (57.607 kg)  10/05/15 129 lb 12.8 oz (  58.877 kg)     PHYSICAL EXAM: VS:  BP 142/80 mmHg  Pulse 68  Ht 5\' 6"  (1.676 m)  Wt 125 lb (56.7 kg)  BMI 20.19 kg/m2  SpO2 97% , BMI Body mass index is 20.19 kg/(m^2). General:Pleasant affect, NAD Skin:Warm and dry, brisk capillary refill HEENT:normocephalic, sclera clear, mucus membranes moist Neck:supple, no JVD, no bruits  Heart:S1S2 irreg irreg without murmur, gallup, rub or click Lungs:clear without rales, rhonchi, or wheezes [id number], non tender, + BS, do not palpate liver spleen or masses Ext:tr  lower ext edema, 2+ pedal pulses, 2+ radial pulses Neuro:alert and oriented X 3, MAE, follows commands, + facial symmetry    EKG:  EKG is ordered today. The ekg ordered today demonstrates A fib with RVR at 105 no acute changes otherwise.    Recent Labs: 06/09/2015: ALT 12; BUN 17; Creatinine, Ser 1.23*; Potassium 4.3; Sodium 129* 11/14/2015: Hemoglobin 14.6; Platelets 241; TSH 0.616    Lipid Panel    Component Value Date/Time   CHOL 175 06/09/2015 0911   TRIG 103.0 06/09/2015 0911   HDL 59.90 06/09/2015 0911   CHOLHDL 3 06/09/2015 0911   VLDL 20.6 06/09/2015 0911   LDLCALC 95 06/09/2015 0911       Other studies Reviewed: Additional studies/ records that were reviewed today include:   Echo in 2011 with no Lt atrial enlargement, EF 60-65%, G1DD.   ASSESSMENT AND PLAN:   1. Paroxysmal atrial fibrillation, now back in a fib with RVR at 105.  First known episode since 2010.  Discussed with Dr. Irish Lack  and Roderic Palau in A fib clinic, will increase her dilt by adding 120 mg in evening --will arrange for TEE and DCCV this week.  Her INR recently was 3.5, her dose was decreased, last low level was 09/2015 with INR 1.3.   She will then follow up in a fib clinic post procedure and with Dr. Mare Ferrari in 2 months.  If a fib returns post procedure or DCCV not successful would add antiarrythmic as discussed with Maximino Greenland, NP .  2. Essential hypertension- elevated today but with increase of dilt will be better controlled  3. Peripheral neuropathy. 4. Hypercholesterolemia on statin 5. Decreased pulses in lower extremities      [facility] has been requested to perform a transesophageal echocardiogram on 12/09/15 for symptomatic a fib.   After careful review of history and examination, the risks and benefits of transesophageal echocardiogram have been explained including risks of esophageal damage, perforation (1:10,000 risk), bleeding, pharyngeal hematoma as well as other potential complications associated with conscious sedation including aspiration, arrhythmia, respiratory failure and death. Alternatives to treatment were discussed, questions were answered. Patient is willing to proceed.  Also discussed cardioversion.    Isaiah Serge, PA 12/06/2015 12:25 PM   Current medicines are reviewed with the patient today.  The patient Has no concerns regarding medicines.  The following changes have been made:  See above Labs/ tests ordered today include:see above  Disposition:   FU:  see above  Signed, Isaiah Serge, NP  12/06/2015 11:36 AM    [facility] [address] [place], [place], [place] Phone: [phone]; Fax: [phone]

## 2015-12-07 ENCOUNTER — Ambulatory Visit (INDEPENDENT_AMBULATORY_CARE_PROVIDER_SITE_OTHER): Payer: Medicare Other | Admitting: Pharmacist

## 2015-12-07 ENCOUNTER — Telehealth: Payer: Self-pay | Admitting: Cardiology

## 2015-12-07 DIAGNOSIS — Z5181 Encounter for therapeutic drug level monitoring: Secondary | ICD-10-CM

## 2015-12-07 LAB — PROTIME-INR
INR: 1.96 — AB (ref <1.50)
Prothrombin Time: 22.6 seconds — ABNORMAL HIGH (ref 11.6–15.2)

## 2015-12-07 NOTE — Telephone Encounter (Signed)
Spoke with pt.  INR addressed by Coumadin clinic.  See anti-coag note for details.

## 2015-12-07 NOTE — Telephone Encounter (Signed)
°  New Prob   Pt is calling regarding her most recent INR and plans for 1 night stay at the hospital. Please call.

## 2015-12-09 ENCOUNTER — Encounter (HOSPITAL_COMMUNITY): Payer: Self-pay | Admitting: *Deleted

## 2015-12-09 ENCOUNTER — Ambulatory Visit (HOSPITAL_COMMUNITY)
Admission: RE | Admit: 2015-12-09 | Discharge: 2015-12-09 | Disposition: A | Discharge status: Home / Self Care | Payer: Medicare Other | Source: Ambulatory Visit | Attending: Cardiovascular Disease | Admitting: Cardiovascular Disease

## 2015-12-09 ENCOUNTER — Ambulatory Visit (HOSPITAL_COMMUNITY): Payer: Medicare Other | Admitting: Certified Registered Nurse Anesthetist

## 2015-12-09 ENCOUNTER — Ambulatory Visit (HOSPITAL_BASED_OUTPATIENT_CLINIC_OR_DEPARTMENT_OTHER): Payer: Medicare Other

## 2015-12-09 ENCOUNTER — Encounter (HOSPITAL_COMMUNITY)
Admission: RE | Disposition: A | Discharge status: Home / Self Care | Payer: Self-pay | Source: Ambulatory Visit | Attending: Cardiovascular Disease

## 2015-12-09 DIAGNOSIS — Z79899 Other long term (current) drug therapy: Secondary | ICD-10-CM | POA: Diagnosis not present

## 2015-12-09 DIAGNOSIS — Z7901 Long term (current) use of anticoagulants: Secondary | ICD-10-CM | POA: Insufficient documentation

## 2015-12-09 DIAGNOSIS — I340 Nonrheumatic mitral (valve) insufficiency: Secondary | ICD-10-CM | POA: Diagnosis not present

## 2015-12-09 DIAGNOSIS — M160 Bilateral primary osteoarthritis of hip: Secondary | ICD-10-CM | POA: Diagnosis not present

## 2015-12-09 DIAGNOSIS — M47816 Spondylosis without myelopathy or radiculopathy, lumbar region: Secondary | ICD-10-CM | POA: Insufficient documentation

## 2015-12-09 DIAGNOSIS — J449 Chronic obstructive pulmonary disease, unspecified: Secondary | ICD-10-CM | POA: Diagnosis not present

## 2015-12-09 DIAGNOSIS — Z87891 Personal history of nicotine dependence: Secondary | ICD-10-CM | POA: Diagnosis not present

## 2015-12-09 DIAGNOSIS — E7800 Pure hypercholesterolemia, unspecified: Secondary | ICD-10-CM | POA: Diagnosis not present

## 2015-12-09 DIAGNOSIS — N189 Chronic kidney disease, unspecified: Secondary | ICD-10-CM | POA: Diagnosis not present

## 2015-12-09 DIAGNOSIS — Z8673 Personal history of transient ischemic attack (TIA), and cerebral infarction without residual deficits: Secondary | ICD-10-CM | POA: Diagnosis not present

## 2015-12-09 DIAGNOSIS — K219 Gastro-esophageal reflux disease without esophagitis: Secondary | ICD-10-CM | POA: Diagnosis not present

## 2015-12-09 DIAGNOSIS — I129 Hypertensive chronic kidney disease with stage 1 through stage 4 chronic kidney disease, or unspecified chronic kidney disease: Secondary | ICD-10-CM | POA: Insufficient documentation

## 2015-12-09 DIAGNOSIS — I10 Essential (primary) hypertension: Secondary | ICD-10-CM | POA: Diagnosis not present

## 2015-12-09 DIAGNOSIS — I4891 Unspecified atrial fibrillation: Secondary | ICD-10-CM | POA: Diagnosis not present

## 2015-12-09 DIAGNOSIS — I480 Paroxysmal atrial fibrillation: Secondary | ICD-10-CM | POA: Insufficient documentation

## 2015-12-09 DIAGNOSIS — E039 Hypothyroidism, unspecified: Secondary | ICD-10-CM | POA: Diagnosis not present

## 2015-12-09 DIAGNOSIS — G4700 Insomnia, unspecified: Secondary | ICD-10-CM | POA: Insufficient documentation

## 2015-12-09 DIAGNOSIS — G629 Polyneuropathy, unspecified: Secondary | ICD-10-CM | POA: Diagnosis not present

## 2015-12-09 DIAGNOSIS — M170 Bilateral primary osteoarthritis of knee: Secondary | ICD-10-CM | POA: Insufficient documentation

## 2015-12-09 HISTORY — PX: CARDIOVERSION: SHX1299

## 2015-12-09 HISTORY — PX: TEE WITHOUT CARDIOVERSION: SHX5443

## 2015-12-09 LAB — PROTIME-INR
INR: 3.35 — ABNORMAL HIGH (ref 0.00–1.49)
Prothrombin Time: 33.3 seconds — ABNORMAL HIGH (ref 11.6–15.2)

## 2015-12-09 SURGERY — ECHOCARDIOGRAM, TRANSESOPHAGEAL
Anesthesia: Monitor Anesthesia Care

## 2015-12-09 MED ORDER — LACTATED RINGERS IV SOLN: INTRAVENOUS | Status: DC

## 2015-12-09 MED ORDER — BUTAMBEN-TETRACAINE-BENZOCAINE 2-2-14 % EX AERO
INHALATION_SPRAY | CUTANEOUS | Status: DC | PRN
  Administered 2015-12-09: 2 via TOPICAL

## 2015-12-09 MED ORDER — PROPOFOL 10 MG/ML IV BOLUS
INTRAVENOUS | Status: DC | PRN
  Administered 2015-12-09: 10 mg via INTRAVENOUS
  Administered 2015-12-09 (×3): 20 mg via INTRAVENOUS
  Administered 2015-12-09: 10 mg via INTRAVENOUS
  Administered 2015-12-09: 20 mg via INTRAVENOUS

## 2015-12-09 MED ORDER — SODIUM CHLORIDE 0.9 % IV SOLN
INTRAVENOUS | Status: DC
  Administered 2015-12-09: 1000 mL via INTRAVENOUS
  Administered 2015-12-09: 11:00:00 via INTRAVENOUS

## 2015-12-09 MED ORDER — LIDOCAINE HCL (CARDIAC) 20 MG/ML IV SOLN
INTRAVENOUS | Status: DC | PRN
  Administered 2015-12-09: 30 mg via INTRAVENOUS

## 2015-12-09 NOTE — Discharge Instructions (Signed)
Electrical Cardioversion, Care After °Refer to this sheet in the next few weeks. These instructions provide you with information on caring for yourself after your procedure. Your health care provider may also give you more specific instructions. Your treatment has been planned according to current medical practices, but problems sometimes occur. Call your health care provider if you have any problems or questions after your procedure. °WHAT TO EXPECT AFTER THE PROCEDURE °After your procedure, it is typical to have the following sensations: °· Some redness on the skin where the shocks were delivered. If this is tender, a sunburn lotion or hydrocortisone cream may help. °· Possible return of an abnormal heart rhythm within hours or days after the procedure. °HOME CARE INSTRUCTIONS °· Take medicines only as directed by your health care provider. Be sure you understand how and when to take your medicine. °· Learn how to feel your pulse and check it often. °· Limit your activity for 48 hours after the procedure or as directed by your health care provider. °· Avoid or minimize caffeine and other stimulants as directed by your health care provider. °SEEK MEDICAL CARE IF: °· You feel like your heart is beating too fast or your pulse is not regular. °· You have any questions about your medicines. °· You have bleeding that will not stop. °SEEK IMMEDIATE MEDICAL CARE IF: °· You are dizzy or feel faint. °· It is hard to breathe or you feel short of breath. °· There is a change in discomfort in your chest. °· Your speech is slurred or you have trouble moving an arm or leg on one side of your body. °· You get a serious muscle cramp that does not go away. °· Your fingers or toes turn cold or blue. °  °This information is not intended to replace advice given to you by your health care provider. Make sure you discuss any questions you have with your health care provider. °  °Document Released: 10/07/2013 Document Revised: 01/07/2015  Document Reviewed: 10/07/2013 °Elsevier Interactive Patient Education ©2016 [employer] °Transesophageal Echocardiogram °Transesophageal echocardiography (TEE) is a picture test of your heart using sound waves. The pictures taken can give very detailed pictures of your heart. This can help your doctor see if there are problems with your heart. TEE can check: °· If your heart has blood clots in it. °· How well your heart valves are working. °· If you have an infection on the inside of your heart. °· Some of the major arteries of your heart. °· If your heart valve is working after a repair. °· Your heart before a procedure that uses a shock to your heart to get the rhythm back to normal. °BEFORE THE PROCEDURE °· Do not eat or drink for 6 hours before the procedure or as told by your doctor. °· Make plans to have someone drive you home after the procedure. Do not drive yourself home. °· An IV tube will be put in your arm. °PROCEDURE °· You will be given a medicine to help you relax (sedative). It will be given through the IV tube. °· A numbing medicine will be sprayed or gargled in the back of your throat to help numb it. °· The tip of the probe is placed into the back of your mouth. You will be asked to swallow. This helps to pass the probe into your esophagus. °· Once the tip of the probe is in the right place, your doctor can take pictures of your heart. °· You   may feel pressure at the back of your throat. °AFTER THE PROCEDURE °· You will be taken to a recovery area so the sedative can wear off. °· Your throat may be sore and scratchy. This will go away slowly over time. °· You will go home when you are fully awake and able to swallow liquids. °· You should have someone stay with you for the next 24 hours. °· Do not drive or operate machinery for the next 24 hours. °  °This information is not intended to replace advice given to you by your health care provider. Make sure you discuss any questions you have with  your health care provider. °  °Document Released: 10/14/2009 Document Revised: 12/22/2013 Document Reviewed: 06/18/2013 °Elsevier Interactive Patient Education ©2016 [employer] ° °

## 2015-12-09 NOTE — Progress Notes (Signed)
*  PRELIMINARY RESULTS* Echocardiogram Echocardiogram Transesophageal has been performed.  Lisa Lucero 12/09/2015, 11:40 AM

## 2015-12-09 NOTE — CV Procedure (Signed)
    Electrical Cardioversion Procedure Note Lisa Lucero [id number] April 05, 1935  Procedure: Electrical Cardioversion Indications:  Atrial Fibrillation  Time Out: Verified patient identification, verified procedure,medications/allergies/relevent history reviewed, required imaging and test results available.  Performed  Procedure Details  The patient was NPO after midnight. Anesthesia was administered at the beside  by anesthesia.  Cardioversion was performed with synchronized biphasic defibrillation via AP pads with 120 joules.  1 attempt(s) were performed.  The patient converted to normal sinus rhythm. The patient tolerated the procedure well   IMPRESSION:  Successful cardioversion of atrial fibrillation INR 3.3    Lisa Lucero 12/09/2015, 11:38 AM

## 2015-12-09 NOTE — Anesthesia Preprocedure Evaluation (Signed)
Anesthesia Evaluation  Patient identified by MRN, date of birth, ID band Patient awake    Reviewed: Allergy & Precautions, H&P , NPO status , Patient's Chart, lab work & pertinent test results, reviewed documented beta blocker date and time   Airway Mallampati: III  TM Distance: >3 FB Neck ROM: Full    Dental no notable dental hx. (+) Teeth Intact, Dental Advisory Given   Pulmonary COPD, former smoker,    Pulmonary exam normal breath sounds clear to auscultation       Cardiovascular hypertension, Pt. on medications and Pt. on home beta blockers + Peripheral Vascular Disease  + dysrhythmias Atrial Fibrillation  Rhythm:Irregular Rate:Normal     Neuro/Psych CVA negative psych ROS   GI/Hepatic Neg liver ROS, PUD, GERD  Medicated and Controlled,  Endo/Other  Hypothyroidism   Renal/GU negative Renal ROS  negative genitourinary   Musculoskeletal  (+) Arthritis , Osteoarthritis,    Abdominal   Peds  Hematology negative hematology ROS (+)   Anesthesia Other Findings   Reproductive/Obstetrics negative OB ROS                             Anesthesia Physical Anesthesia Plan  ASA: III  Anesthesia Plan: MAC   Post-op Pain Management:    Induction: Intravenous  Airway Management Planned: Nasal Cannula  Additional Equipment:   Intra-op Plan:   Post-operative Plan:   Informed Consent: I have reviewed the patients History and Physical, chart, labs and discussed the procedure including the risks, benefits and alternatives for the proposed anesthesia with the patient or authorized representative who has indicated his/her understanding and acceptance.   Dental advisory given  Plan Discussed with: CRNA  Anesthesia Plan Comments:         Anesthesia Quick Evaluation

## 2015-12-09 NOTE — H&P (View-Only) (Signed)
Cardiology Office Note   Date:  12/06/2015   ID:  PALLIE BANEZ, DOB 10-30-1935, MRN [id number]  PCP:  Vikki Ports, MD  Cardiologist:  Dr. Mare Ferrari    Chief Complaint  Patient presents with  . Atrial Fibrillation      History of Present Illness: Lisa Lucero is a 80 y.o. female who presents for an early appt for increased dizziness with walking or turning.  Has been going on for 2 weeks and she is feeling irregular HR at times.  No chest pain and no SOB.  She denies any bleeding in stool or urine.    She has a past history of paroxysmal atrial fibrillation. She was last hospitalized for atrial fibrillation in March of 2010. She is on long-term Coumadin. After she had gone off Coumadin and was maintaining normal sinus rhythm she was hospitalized with an embolic stroke and was placed back on long-term Coumadin. The Coumadin had to be held in October 2011 when she was admitted to [facility] with a GI bleed secondary to erosive duodenitis. She's had no further problems with her stomach since being placed on long-term proton pump inhibitor. She underwent back surgery by Dr. Lorin Mercy on 09/29/2014 And underwent surgery for her patella fracture on 10/25/2014 also by Dr. Rodell Perna.  Her last INR was 3.5 will recheck today in Sept INR was 1.3.     Past Medical History  Diagnosis Date  . Paroxysmal atrial fibrillation (HCC)   . History of embolic stroke [id number]  . Erosive gastritis     with GI bleed thought due to elevated INR and duodenitis  . Hyperthyroidism   . Peripheral edema   . Long-term (current) use of anticoagulants   . HTN (hypertension)   . Stroke ([place])   . GERD (gastroesophageal reflux disease)   . Back pain   . COPD (chronic obstructive pulmonary disease) ([place])     pt denies on 09/27/14  . H/O hypercholesterolemia     PMH: only  . Arthritis   . Anemia     Iron deficiency anemia  . Hypothyroidism   . Insomnia     prev tried Trazadone (2014); ambien CR caused hangover; on  ambien since 11/2013  . Osteoporosis   . Hypothyroidism   . H/O: GI bleed   . Osteoarthritis of both knees   . Primary osteoarthritis of both hips   . Osteoarthritis of lumbar spine   . H/O transfusion of packed red blood cells   . External hemorrhoids without complication   . Pyuria   . Postmenopausal   . Arthropathy   . Blood in stool   . Iron deficiency anemia due to chronic blood loss   . Hemorrhage of rectum and anus   . Chronic kidney disease   . CVA (cerebral vascular accident) [facility])     Past Surgical History  Procedure Laterality Date  . Hemorrhoid surgery    . Operative hysteroscopy    . Abdominal hysterectomy    . Cholecystectomy  03/11/2012    Procedure: LAPAROSCOPIC CHOLECYSTECTOMY WITH INTRAOPERATIVE CHOLANGIOGRAM;  Surgeon: Joyice Faster. Cornett, MD;  Location: [facility];  Service: General;  Laterality: N/A;  . Breast enhancement surgery    . Colonoscopy    . Wisdom tooth extraction    . Lumbar laminectomy N/A 09/29/2014    Procedure: L4-5 Decompression, Hemi-Laminectomy,  Removal Free Fragment;  Surgeon: Marybelle Killings, MD;  Location: [facility];  Service: Orthopedics;  Laterality: N/A;  . Orif patella Left  10/25/2014    Procedure: OPEN REDUCTION INTERNAL (ORIF) FIXATION LEFT PATELLA;  Surgeon: Marybelle Killings, MD;  Location: [facility];  Service: Orthopedics;  Laterality: Left;  . Cholecystectomy    . Cyst excision  11/2014    left chest wall--nodular hidroadenoma     Current Outpatient Prescriptions  Medication Sig Dispense Refill  . calcium carbonate (TUMS - DOSED IN MG ELEMENTAL CALCIUM) 500 MG chewable tablet Chew 1 tablet by mouth 2 (two) times daily.    Marland Kitchen CARTIA XT 240 MG 24 hr capsule TAKE 1 CAPSULE DAILY 90 capsule 0  . dibucaine (NUPERCAINAL) 1 % ointment Apply 1 application topically 3 (three) times daily as needed for pain (for vaginal discomfort).    . FERREX 150 150 MG capsule TAKE ONE CAPSULE BY MOUTH EVERY DAY 30 capsule 2  . gabapentin (NEURONTIN) 100 MG capsule  Take two (2) capsules (200 mg total) by mouth daily at bedtime. May increase one (1) extra capsule (100 mg total) by mouth at bedtime as needed for pain.  2  . hydrochlorothiazide (HYDRODIURIL) 25 MG tablet Take 12.5 mg by mouth every other day.    Marland Kitchen KLOR-CON M10 10 MEQ tablet TAKE 1 TABLET TWICE A DAY 180 tablet 0  . levothyroxine (SYNTHROID, LEVOTHROID) 25 MCG tablet Take 25 mcg by mouth daily before breakfast.    . lisinopril (PRINIVIL,ZESTRIL) 20 MG tablet TAKE 1 TABLET TWICE A DAY 180 tablet 1  . Magnesium 500 MG TABS Take 1 tablet by mouth daily.    . metoprolol (LOPRESSOR) 50 MG tablet Take 1 tablet (50 mg total) by mouth 2 (two) times daily. 180 tablet 1  . Multiple Vitamin (MULITIVITAMIN WITH MINERALS) TABS Take 1 tablet by mouth daily.    . pantoprazole (PROTONIX) 40 MG tablet TAKE 1 TABLET DAILY 90 tablet 1  . pravastatin (PRAVACHOL) 20 MG tablet TAKE 1 TABLET (20 MG TOTAL) BY MOUTH EVERY EVENING. 30 tablet 5  . warfarin (COUMADIN) 5 MG tablet Take as directed by coumadin clinic 100 tablet 0  . zolpidem (AMBIEN CR) 6.25 MG CR tablet TAKE 1 TABLET BY MOUTH AT BEDTIME 30 tablet 2  . [DISCONTINUED] diphenhydrAMINE (BENADRYL) 25 MG tablet Take 25 mg by mouth every 6 (six) hours as needed.      . [DISCONTINUED] IRON PO Take 1 tablet by mouth daily.     . [DISCONTINUED] potassium chloride (K-DUR) 10 MEQ tablet Take 1 tablet (10 mEq total) by mouth 2 (two) times daily. 90 tablet 0   No current facility-administered medications for this visit.    Allergies:   Celebrex and Lipitor    Social History:  The patient  reports that she quit smoking about 5 years ago. Her smoking use included Cigarettes. She has never used smokeless tobacco. She reports that she does not drink alcohol or use illicit drugs.   Family History:  The patient's family history includes Arthritis in an other family member; Cancer in her brother; Dementia in her father; Hypertension in an other family member; Stroke in her  mother.    ROS:  General:no colds or fevers, weight down 4 lbs.  Skin:no rashes or ulcers HEENT:no blurred vision, no congestion CV:see HPI PUL:see HPI GI:no diarrhea constipation or melena, no indigestion GU:no hematuria, no dysuria MS:no joint pain, no claudication Neuro:no syncope, + lightheadedness Endo:no diabetes, no thyroid disease  Wt Readings from Last 3 Encounters:  12/06/15 125 lb (56.7 kg)  11/14/15 127 lb (57.607 kg)  10/05/15 129 lb 12.8 oz (  58.877 kg)     PHYSICAL EXAM: VS:  BP 142/80 mmHg  Pulse 68  Ht 5\' 6"  (1.676 m)  Wt 125 lb (56.7 kg)  BMI 20.19 kg/m2  SpO2 97% , BMI Body mass index is 20.19 kg/(m^2). General:Pleasant affect, NAD Skin:Warm and dry, brisk capillary refill HEENT:normocephalic, sclera clear, mucus membranes moist Neck:supple, no JVD, no bruits  Heart:S1S2 irreg irreg without murmur, gallup, rub or click Lungs:clear without rales, rhonchi, or wheezes [id number], non tender, + BS, do not palpate liver spleen or masses Ext:tr  lower ext edema, 2+ pedal pulses, 2+ radial pulses Neuro:alert and oriented X 3, MAE, follows commands, + facial symmetry    EKG:  EKG is ordered today. The ekg ordered today demonstrates A fib with RVR at 105 no acute changes otherwise.    Recent Labs: 06/09/2015: ALT 12; BUN 17; Creatinine, Ser 1.23*; Potassium 4.3; Sodium 129* 11/14/2015: Hemoglobin 14.6; Platelets 241; TSH 0.616    Lipid Panel    Component Value Date/Time   CHOL 175 06/09/2015 0911   TRIG 103.0 06/09/2015 0911   HDL 59.90 06/09/2015 0911   CHOLHDL 3 06/09/2015 0911   VLDL 20.6 06/09/2015 0911   LDLCALC 95 06/09/2015 0911       Other studies Reviewed: Additional studies/ records that were reviewed today include:   Echo in 2011 with no Lt atrial enlargement, EF 60-65%, G1DD.   ASSESSMENT AND PLAN:   1. Paroxysmal atrial fibrillation, now back in a fib with RVR at 105.  First known episode since 2010.  Discussed with Dr. Irish Lack  and Roderic Palau in A fib clinic, will increase her dilt by adding 120 mg in evening --will arrange for TEE and DCCV this week.  Her INR recently was 3.5, her dose was decreased, last low level was 09/2015 with INR 1.3.   She will then follow up in a fib clinic post procedure and with Dr. Mare Ferrari in 2 months.  If a fib returns post procedure or DCCV not successful would add antiarrythmic as discussed with Maximino Greenland, NP .  2. Essential hypertension- elevated today but with increase of dilt will be better controlled  3. Peripheral neuropathy. 4. Hypercholesterolemia on statin 5. Decreased pulses in lower extremities      [facility] has been requested to perform a transesophageal echocardiogram on 12/09/15 for symptomatic a fib.   After careful review of history and examination, the risks and benefits of transesophageal echocardiogram have been explained including risks of esophageal damage, perforation (1:10,000 risk), bleeding, pharyngeal hematoma as well as other potential complications associated with conscious sedation including aspiration, arrhythmia, respiratory failure and death. Alternatives to treatment were discussed, questions were answered. Patient is willing to proceed.  Also discussed cardioversion.    Isaiah Serge, PA 12/06/2015 12:25 PM   Current medicines are reviewed with the patient today.  The patient Has no concerns regarding medicines.  The following changes have been made:  See above Labs/ tests ordered today include:see above  Disposition:   FU:  see above  Signed, Isaiah Serge, NP  12/06/2015 11:36 AM    [facility] [place], [place], [place], [place] Phone: [phone]; Fax: [phone]

## 2015-12-09 NOTE — Interval H&P Note (Signed)
History and Physical Interval Note:  12/09/2015 10:17 AM  Lisa Lucero  has presented today for surgery, with the diagnosis of AFIB  The various methods of treatment have been discussed with the patient and family. After consideration of risks, benefits and other options for treatment, the patient has consented to  Procedure(s): TRANSESOPHAGEAL ECHOCARDIOGRAM (TEE) (N/A) CARDIOVERSION (N/A) as a surgical intervention .  The patient's history has been reviewed, patient examined, no change in status, stable for surgery.  I have reviewed the patient's chart and labs.  Questions were answered to the patient's satisfaction.     Norm Wray

## 2015-12-09 NOTE — Anesthesia Postprocedure Evaluation (Signed)
Anesthesia Post Note  Patient: Lisa Lucero  Procedure(s) Performed: Procedure(s) (LRB): TRANSESOPHAGEAL ECHOCARDIOGRAM (TEE) (N/A) CARDIOVERSION (N/A)  Patient location during evaluation: PACU Anesthesia Type: MAC Level of consciousness: awake and alert Pain management: pain level controlled Vital Signs Assessment: post-procedure vital signs reviewed and stable Respiratory status: spontaneous breathing, nonlabored ventilation and respiratory function stable Cardiovascular status: stable and blood pressure returned to baseline Anesthetic complications: no    Last Vitals:  Filed Vitals:   12/09/15 1140 12/09/15 1150  BP: 115/92 128/86  Pulse: 54 52  Temp:    Resp: 11 12    Last Pain: There were no vitals filed for this visit.               Jarvis Knodel,W. EDMOND

## 2015-12-09 NOTE — Transfer of Care (Signed)
Immediate Anesthesia Transfer of Care Note  Patient: Lisa Lucero  Procedure(s) Performed: Procedure(s): TRANSESOPHAGEAL ECHOCARDIOGRAM (TEE) (N/A) CARDIOVERSION (N/A)  Patient Location: PACU  Anesthesia Type:MAC  Level of Consciousness: patient cooperative and responds to stimulation  Airway & Oxygen Therapy: Patient Spontanous Breathing and Patient connected to nasal cannula oxygen  Post-op Assessment: Report given to RN and Post -op Vital signs reviewed and stable  Post vital signs: Reviewed and stable  Last Vitals:  Filed Vitals:   12/09/15 1125 12/09/15 1127  BP: 135/71 134/77  Temp:    Resp:      Complications: No apparent anesthesia complications

## 2015-12-12 ENCOUNTER — Telehealth: Payer: Self-pay | Admitting: Cardiology

## 2015-12-12 ENCOUNTER — Encounter (HOSPITAL_COMMUNITY): Payer: Self-pay | Admitting: Cardiology

## 2015-12-12 NOTE — Telephone Encounter (Signed)
Discussed with  Dr. Mare Ferrari and will have patient increase her HCTZ to 12.5 mg every day Avoid salt in diet  Advised patient, verbalized understanding

## 2015-12-12 NOTE — Telephone Encounter (Signed)
Spoke with patient and she has been having bilateral ankle/feet swelling for last couple of weeks.  Swelling increasing over the last week, right worse than left Patient denies any shortness of breath  Blood pressure running in the 140's-150's, she just started her added Diltiazem 120 mg 2 days ago Denies any shortness of breath  Does not add salt but does not add  Swelling worse when she is up on her feet  Never heard anything about her TEE from the hospital  Will forward to  Dr. Mare Ferrari for review

## 2015-12-12 NOTE — Telephone Encounter (Signed)
New message      Pt c/o swelling: STAT is pt has developed SOB within 24 hours  1. How long have you been experiencing swelling? About a week 2. Where is the swelling located? Ankles, feet and lower leg up to knee 3.  Are you currently taking a "fluid pill"?  yes 4.  Are you currently SOB? no  5.  Have you traveled recently? no Pt had a TEE and cardioversion on 12-09-15

## 2015-12-14 ENCOUNTER — Telehealth: Payer: Self-pay | Admitting: Family Medicine

## 2015-12-14 DIAGNOSIS — I10 Essential (primary) hypertension: Secondary | ICD-10-CM

## 2015-12-14 DIAGNOSIS — E039 Hypothyroidism, unspecified: Secondary | ICD-10-CM

## 2015-12-14 DIAGNOSIS — R600 Localized edema: Secondary | ICD-10-CM

## 2015-12-14 MED ORDER — LEVOTHYROXINE SODIUM 25 MCG PO TABS: 25.0000 ug | ORAL_TABLET | Freq: Every day | ORAL | Status: DC

## 2015-12-14 MED ORDER — HYDROCHLOROTHIAZIDE 25 MG PO TABS: 12.5000 mg | ORAL_TABLET | Freq: Every day | ORAL | Status: DC

## 2015-12-14 NOTE — Telephone Encounter (Signed)
Is this okay?

## 2015-12-14 NOTE — Telephone Encounter (Signed)
Pt called and stated that Dr. Mare Ferrari had changed the does on one of her meds. He has changed HCTZ to 1/2 tablet everyday from 1/2 a tablet every other day. She will need refills of HCTZ (with new dosing) and [facility] She also needs [facility]entin sent to [facility] [address].

## 2015-12-14 NOTE — Telephone Encounter (Signed)
[facility]. Other rx's sent to mail order

## 2015-12-15 ENCOUNTER — Ambulatory Visit (INDEPENDENT_AMBULATORY_CARE_PROVIDER_SITE_OTHER): Payer: Medicare Other | Admitting: *Deleted

## 2015-12-15 DIAGNOSIS — I480 Paroxysmal atrial fibrillation: Secondary | ICD-10-CM

## 2015-12-15 DIAGNOSIS — Z5181 Encounter for therapeutic drug level monitoring: Secondary | ICD-10-CM | POA: Diagnosis not present

## 2015-12-15 LAB — POCT INR: INR: 4.6

## 2015-12-18 ENCOUNTER — Encounter: Payer: Self-pay | Admitting: Family Medicine

## 2015-12-18 DIAGNOSIS — E039 Hypothyroidism, unspecified: Secondary | ICD-10-CM | POA: Insufficient documentation

## 2015-12-18 DIAGNOSIS — E042 Nontoxic multinodular goiter: Secondary | ICD-10-CM | POA: Insufficient documentation

## 2015-12-19 ENCOUNTER — Telehealth: Payer: Self-pay | Admitting: Family Medicine

## 2015-12-19 IMAGING — US US SOFT TISSUE HEAD/NECK
1 series · 14 of 25 positions shown · non-contrast
Comparison: 07/14/2013 and earlier studies

CLINICAL DATA: THYROID NODULE

EXAM:
THYROID ULTRASOUND
TECHNIQUE: Ultrasound examination of the thyroid gland and adjacent soft
tissues was performed.

[Series 1: us soft tissue head/neck · 0.06mm/px · 14 of 53 slices shown]
[im 1/53]
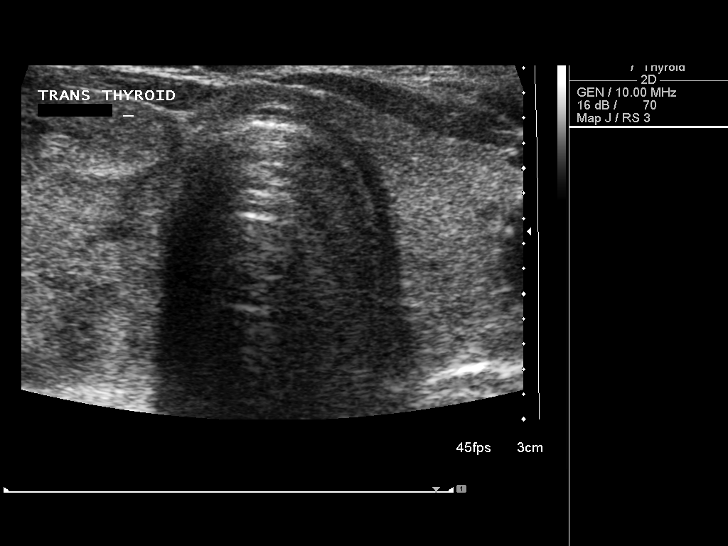
[im 5/53]
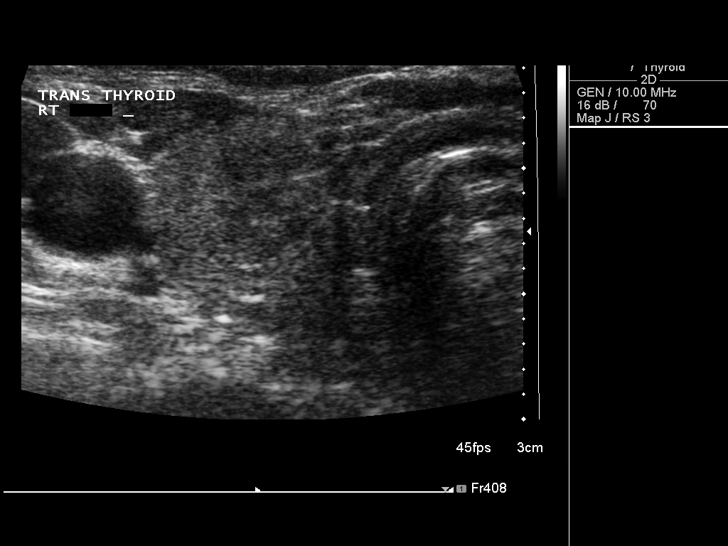
[im 9/53]
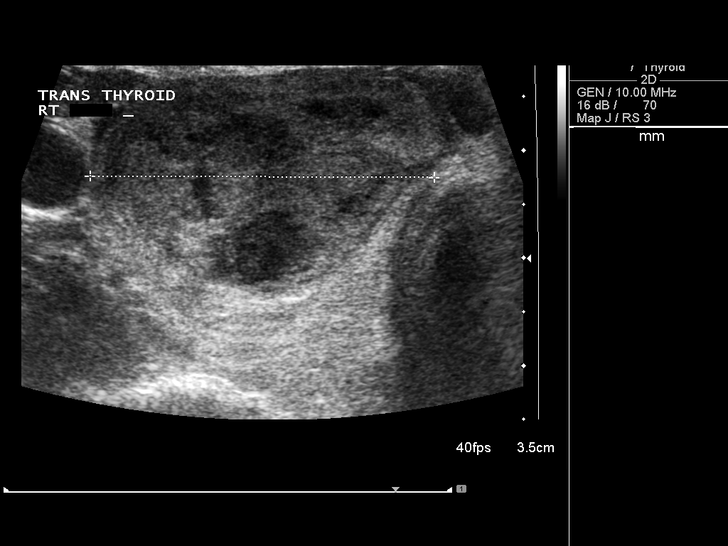
[im 14/53]
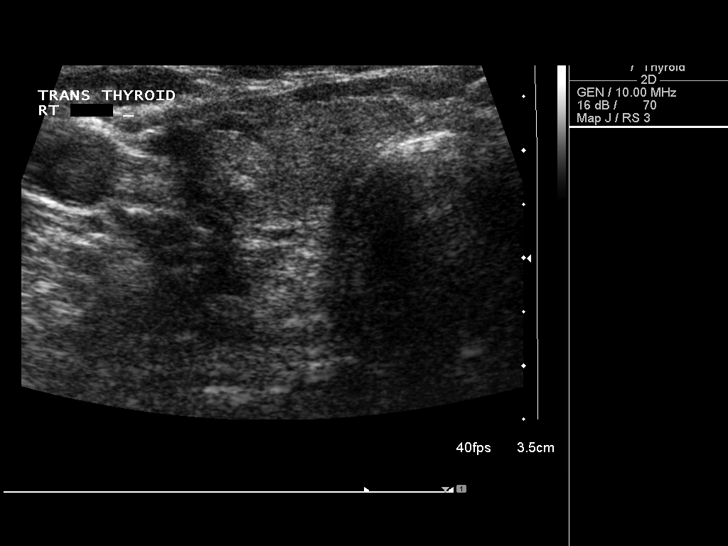
[im 18/53]
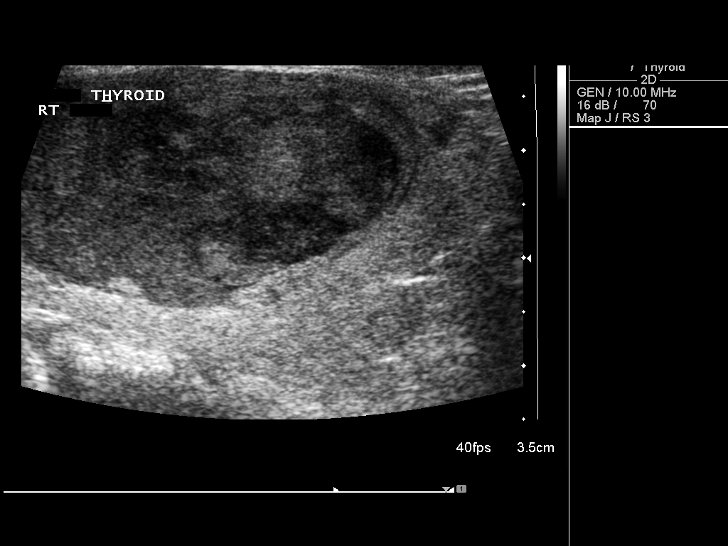
[im 20/53]
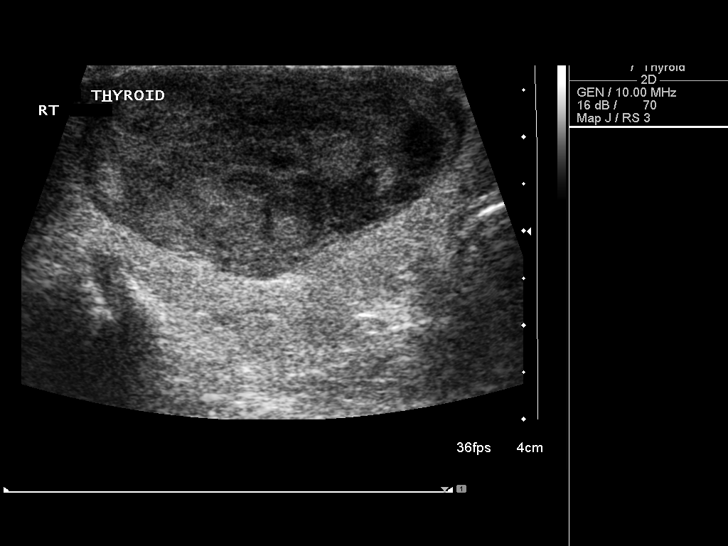
[im 24/53]
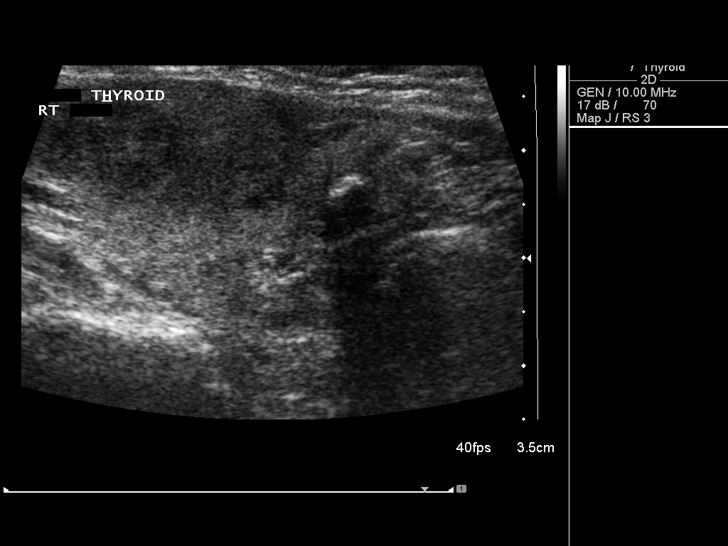
[im 29/53]
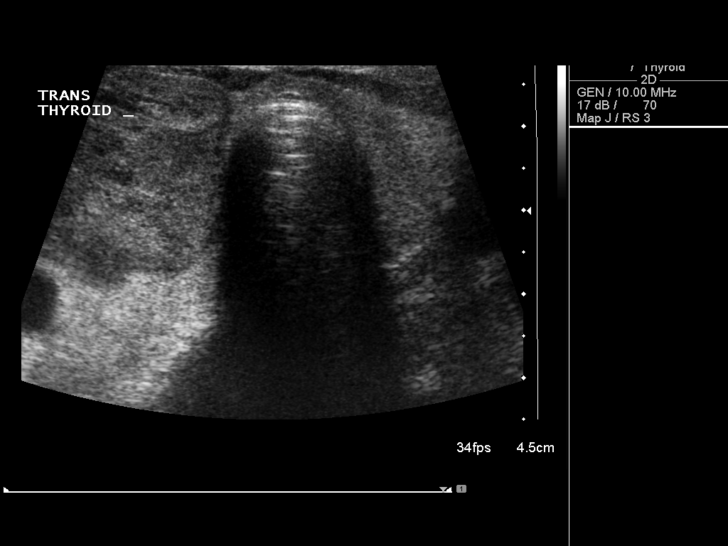
[im 33/53]
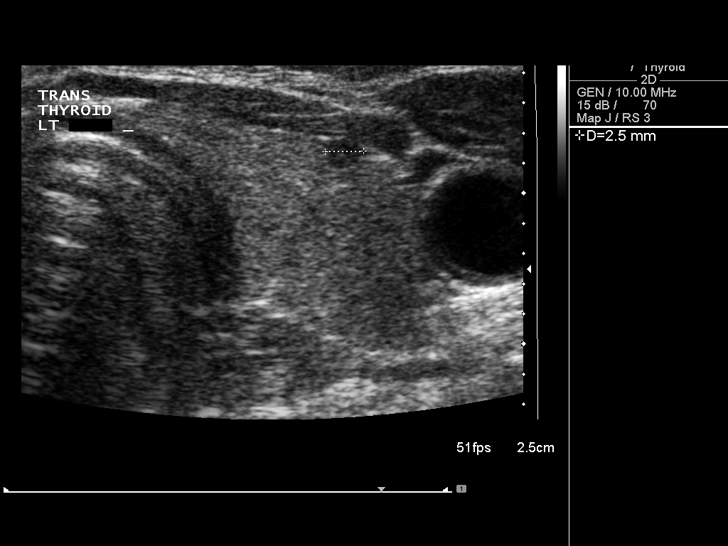
[im 35/53]
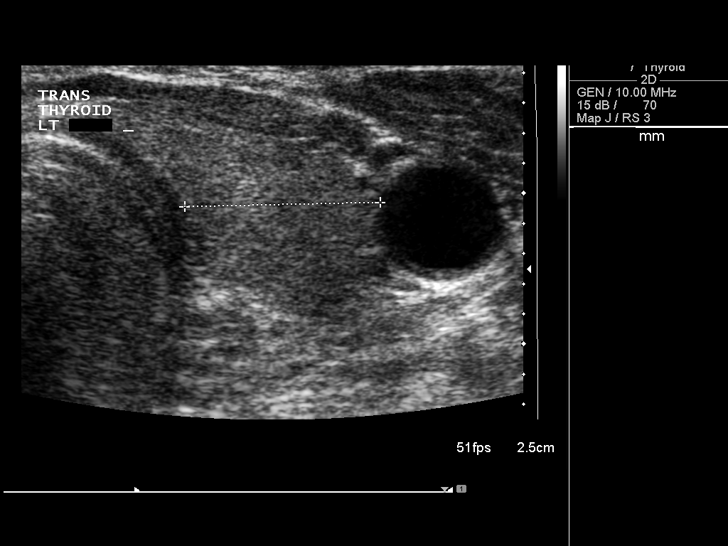
[im 40/53]
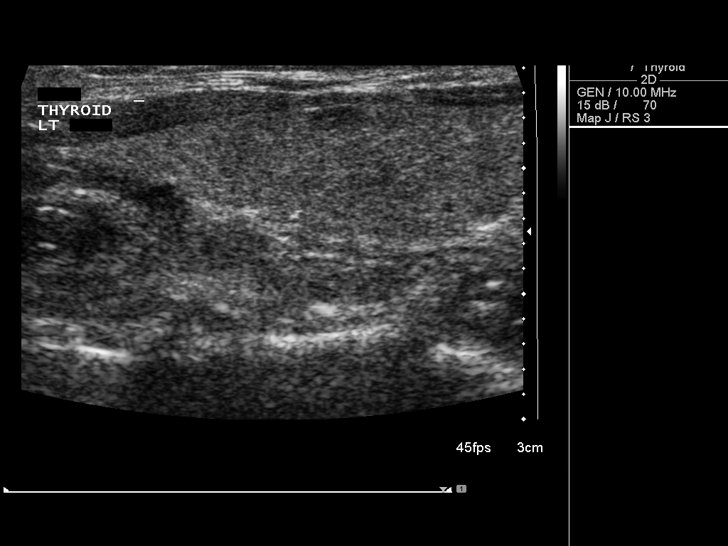
[im 44/53]
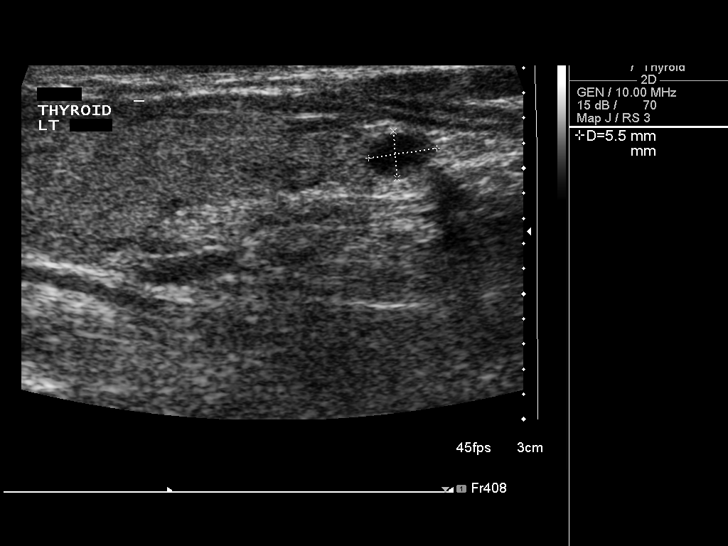
[im 48/53]
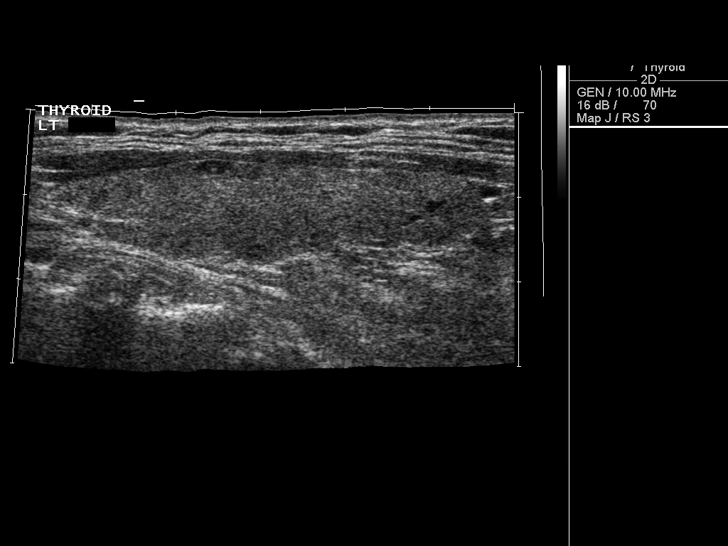
[im 53/53]
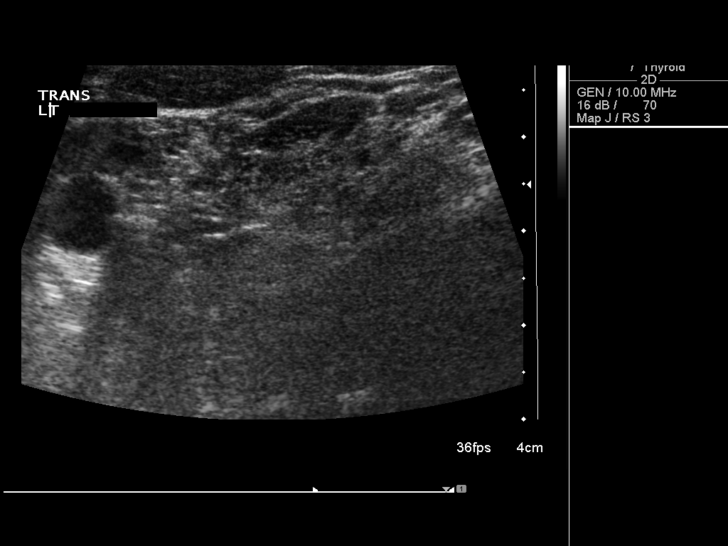

[14 of 25 positions shown; findings below may reference images not displayed]

FINDINGS: Right thyroid lobe

Measurements: 53 x 26 x 32 mm. Homogeneous background echotexture.
Dominant 41 x 23 x 30 mm solid nodule, mid lobe (previously 36 x 27
x 19). There is a smaller hypoechoic 7 mm nodule at the inferior
pole of dominant lesion. There is a 3 mm calcification in the lower
pole.

Left thyroid lobe

Measurements: 53 x 12 x 13 mm. 6 x 4 x 3 mm cystic lesion, inferior
pole.

Isthmus

Thickness: 2.4 mm.  No nodules visualized.

Lymphadenopathy

None visualized.
IMPRESSION: 1. Thyromegaly with bilateral nodules. Slight interval enlargement
of dominant right lesion. Findings meet consensus criteria for
biopsy. Ultrasound-guided fine needle aspiration should be
considered, as per the consensus statement: Management of Thyroid
Nodules Detected at US: Society of Radiologists in Ultrasound

## 2015-12-19 MED ORDER — GABAPENTIN 100 MG PO CAPS
200.0000 mg | ORAL_CAPSULE | Freq: Two times a day (BID) | ORAL | Status: DC
Start: 2015-12-19 — End: 2016-02-09

## 2015-12-19 NOTE — Telephone Encounter (Signed)
New rx sent to [facility].

## 2015-12-19 NOTE — Telephone Encounter (Signed)
Directions from her last visit had been: For now--100mg  (1 capsule) in the morning, 1 mid-day and 3 at bedtime (but cut back if you have a lot of side effects--sedation, grogginess, etc).  Okay to change rx to 2 BID and change quantity to 120, so it lasts for the month, plus 2 refills

## 2015-12-19 NOTE — Telephone Encounter (Signed)
Pt called and stated that she is running out of Gabapentin early. She states she is currently taking 2 am and 2 pm and she has run out early. Pt uses [facility] and can be reached at [phone].

## 2015-12-21 ENCOUNTER — Ambulatory Visit: Payer: Medicare Other | Admitting: Family Medicine

## 2015-12-26 ENCOUNTER — Other Ambulatory Visit: Payer: Self-pay | Admitting: Family Medicine

## 2015-12-27 ENCOUNTER — Telehealth: Payer: Self-pay

## 2015-12-27 ENCOUNTER — Ambulatory Visit (INDEPENDENT_AMBULATORY_CARE_PROVIDER_SITE_OTHER): Payer: Medicare Other | Admitting: Pharmacist

## 2015-12-27 DIAGNOSIS — I4891 Unspecified atrial fibrillation: Secondary | ICD-10-CM

## 2015-12-27 DIAGNOSIS — Z5181 Encounter for therapeutic drug level monitoring: Secondary | ICD-10-CM

## 2015-12-27 LAB — POCT INR: INR: 2.9

## 2015-12-27 NOTE — Telephone Encounter (Signed)
Pt called needing a refill on her Zolpidem 6.25 mg [facility] [address]. She has one left

## 2015-12-27 NOTE — Telephone Encounter (Signed)
Amber called in

## 2015-12-27 NOTE — Telephone Encounter (Signed)
Is this ok to refill?  

## 2015-12-27 NOTE — Telephone Encounter (Signed)
Ok for #30 with 2 refills 

## 2015-12-27 NOTE — Telephone Encounter (Signed)
There was a phone message about this refill.  See phone message

## 2015-12-27 NOTE — Telephone Encounter (Signed)
Per phone message, this was supposed to have 2 additional refills on it.  I can't tell if Amber documented in epic as phone in (with the refills, I don't see it), but they one you put in was for #30 with no refills.  We need to make sure that what is documented is what was phoned in--?2 refills? You might have to ask Amber.  We have to keep good track of these controlled substances.

## 2015-12-27 NOTE — Telephone Encounter (Signed)
Called in.

## 2015-12-28 ENCOUNTER — Other Ambulatory Visit: Payer: Self-pay

## 2015-12-28 MED ORDER — ZOLPIDEM TARTRATE ER 6.25 MG PO TBCR: 6.2500 mg | EXTENDED_RELEASE_TABLET | Freq: Every day | ORAL | Status: DC

## 2015-12-28 NOTE — Telephone Encounter (Signed)
Amber did you give the pt 2 refills when you phoned this in yesterday?

## 2015-12-29 NOTE — Telephone Encounter (Signed)
Yes it has the refills and it has been fixed in the system

## 2015-12-30 ENCOUNTER — Other Ambulatory Visit: Payer: Self-pay | Admitting: Family Medicine

## 2016-01-09 ENCOUNTER — Other Ambulatory Visit: Payer: Self-pay | Admitting: Family Medicine

## 2016-01-09 NOTE — Telephone Encounter (Signed)
She can't just keep switching back and forth at her own discretion. She shouldn't be taking BOTH of these meds together (side effects are greater), needs to decide to take one or the other. If preferring to go back to the Lyrica, she should titrate down off the Gabapentin, and restart the lyrica slower--  Her original instructions were 50mg  BID, but was changed to 50mg  qHS due to side effects.  I would rather her start it at 50mg  qHS, and titrate up to BID in a week if tolerating. Okay to refill for #60 (needs to be called in), with instructions to take qHS, and increase to BID in a week if needed. Refill x 1 only.

## 2016-01-09 NOTE — Telephone Encounter (Signed)
Is this okay?

## 2016-01-09 NOTE — Telephone Encounter (Signed)
Spoke with patient and she states that the gabapentin has become somewhat ineffective. She had Lyrica left over from some time ago and she restarted it 2 days ago (taking 50mg  BID) and states that it is helping her neuropathy. It does make her somewhat dizzy (as you had said earlier) but she states that she will put up with the dizziness since it helps with the pain.

## 2016-01-09 NOTE — Telephone Encounter (Signed)
Called in med to pharmacy. Lisa Lucero spoke to patient about this

## 2016-01-09 NOTE — Telephone Encounter (Signed)
Patient advised and rx sent for Lyrica.

## 2016-01-09 NOTE — Telephone Encounter (Signed)
Looks like per prev visits she hasn't been taking this since October or November (had just been on the gabapentin).  Has she been taking it? Or just restarting it?

## 2016-01-17 ENCOUNTER — Ambulatory Visit (INDEPENDENT_AMBULATORY_CARE_PROVIDER_SITE_OTHER): Payer: Medicare Other

## 2016-01-17 DIAGNOSIS — I4891 Unspecified atrial fibrillation: Secondary | ICD-10-CM | POA: Diagnosis not present

## 2016-01-17 DIAGNOSIS — Z5181 Encounter for therapeutic drug level monitoring: Secondary | ICD-10-CM | POA: Diagnosis not present

## 2016-01-17 LAB — POCT INR: INR: 3.7

## 2016-02-01 ENCOUNTER — Ambulatory Visit (INDEPENDENT_AMBULATORY_CARE_PROVIDER_SITE_OTHER): Payer: Medicare Other | Admitting: Pharmacist

## 2016-02-01 DIAGNOSIS — Z5181 Encounter for therapeutic drug level monitoring: Secondary | ICD-10-CM | POA: Diagnosis not present

## 2016-02-01 DIAGNOSIS — I4891 Unspecified atrial fibrillation: Secondary | ICD-10-CM

## 2016-02-01 LAB — POCT INR: INR: 3.5

## 2016-02-09 ENCOUNTER — Encounter: Payer: Self-pay | Admitting: Cardiology

## 2016-02-09 ENCOUNTER — Ambulatory Visit (INDEPENDENT_AMBULATORY_CARE_PROVIDER_SITE_OTHER): Payer: Medicare Other | Admitting: *Deleted

## 2016-02-09 ENCOUNTER — Ambulatory Visit (INDEPENDENT_AMBULATORY_CARE_PROVIDER_SITE_OTHER): Payer: Medicare Other | Admitting: Cardiology

## 2016-02-09 VITALS — BP 128/60 | HR 60 | Ht 66.0 in | Wt 127.8 lb

## 2016-02-09 DIAGNOSIS — M79606 Pain in leg, unspecified: Secondary | ICD-10-CM | POA: Diagnosis not present

## 2016-02-09 DIAGNOSIS — I480 Paroxysmal atrial fibrillation: Secondary | ICD-10-CM

## 2016-02-09 DIAGNOSIS — G8929 Other chronic pain: Secondary | ICD-10-CM

## 2016-02-09 DIAGNOSIS — Z5181 Encounter for therapeutic drug level monitoring: Secondary | ICD-10-CM | POA: Diagnosis not present

## 2016-02-09 DIAGNOSIS — I4891 Unspecified atrial fibrillation: Secondary | ICD-10-CM

## 2016-02-09 LAB — POCT INR: INR: 3

## 2016-02-09 NOTE — Patient Instructions (Signed)
Medication Instructions:  Your physician recommends that you continue on your current medications as directed. Please refer to the Current Medication list given to you today.  Labwork: none  Testing/Procedures: none  Follow-Up: Your physician wants you to follow-up in: 6 month ov with Dr Skains  You will receive a reminder letter in the mail two months in advance. If you don't receive a letter, please call our office to schedule the follow-up appointment.   If you need a refill on your cardiac medications before your next appointment, please call your pharmacy.  

## 2016-02-09 NOTE — Progress Notes (Signed)
Cardiology Office Note   Date:  02/09/2016   ID:  Lisa Lucero, DOB [birth year], MRN [id number]  PCP:  Vikki Ports, MD  Cardiologist: Darlin Coco MD  No chief complaint on file.     History of Present Illness: Lisa Lucero is a 81 y.o. female who presents for Scheduled 6 month follow-up visit  This pleasant 81 year old woman is seen back for a scheduled followup office visit. She has a past history of paroxysmal atrial fibrillation. She was last hospitalized for atrial fibrillation in March of 2010. She is on long-term Coumadin. After she had gone off Coumadin and was maintaining normal sinus rhythm she was hospitalized with an embolic stroke and was placed back on long-term Coumadin. The Coumadin had to be held in October 2011 when [facility] was [facility] with a GI bleed secondary to erosive duodenitis. She's had no further problems with her stomach since being placed on long-term proton pump inhibitor. She underwent back surgery by Dr. Lorin Mercy on 09/29/2014 And underwent surgery for her patella fracture on 10/25/2014 also by Dr. Rodell Perna. She went back into atrial fibrillationIn early December 2016.  She underwent a TEE cardioversion by Dr. Luther Parody on 12/09/15 and was successfully converted with one shock back to normal sinus rhythm.  Since restoration of normal sinus rhythm she has felt better with increased exercise capacity and decreased exertional dyspnea.  She has not been aware of any recurrent atrial fibrillation since her cardioversion.  Past Medical History  Diagnosis Date  . Paroxysmal atrial fibrillation (HCC)   . History of embolic stroke [id number]  . Erosive gastritis     with GI bleed thought due to elevated INR and duodenitis  . Hyperthyroidism   . Peripheral edema   . Long-term (current) use of anticoagulants   . HTN (hypertension)   . Stroke ([place])   . GERD (gastroesophageal reflux disease)   . Back pain   . COPD (chronic obstructive pulmonary disease) ([place])      pt denies on 09/27/14  . H/O hypercholesterolemia     PMH: only  . Arthritis   . Anemia     Iron deficiency anemia  . Hypothyroidism   . Insomnia     prev tried Trazadone (2014); ambien CR caused hangover; on ambien since 11/2013  . Osteoporosis   . Hypothyroidism   . H/O: GI bleed   . Osteoarthritis of both knees   . Primary osteoarthritis of both hips   . Osteoarthritis of lumbar spine   . H/O transfusion of packed red blood cells   . External hemorrhoids without complication   . Pyuria   . Postmenopausal   . Arthropathy   . Blood in stool   . Iron deficiency anemia due to chronic blood loss   . Hemorrhage of rectum and anus   . Chronic kidney disease   . CVA (cerebral vascular accident) [facility])     Past Surgical History  Procedure Laterality Date  . Hemorrhoid surgery    . Operative hysteroscopy    . Abdominal hysterectomy    . Cholecystectomy  03/11/2012    Procedure: LAPAROSCOPIC CHOLECYSTECTOMY WITH INTRAOPERATIVE CHOLANGIOGRAM;  Surgeon: Joyice Faster. Cornett, MD;  Location: [place];  Service: General;  Laterality: N/A;  . Breast enhancement surgery    . Colonoscopy    . Wisdom tooth extraction    . Lumbar laminectomy N/A 09/29/2014    Procedure: L4-5 Decompression, Hemi-Laminectomy,  Removal Free Fragment;  Surgeon: Marybelle Killings, MD;  Location: [place];  Service: Orthopedics;  Laterality: N/A;  . Orif patella Left 10/25/2014    Procedure: OPEN REDUCTION INTERNAL (ORIF) FIXATION LEFT PATELLA;  Surgeon: Marybelle Killings, MD;  Location: [place];  Service: Orthopedics;  Laterality: Left;  . Cholecystectomy    . Cyst excision  11/2014    left chest wall--nodular hidroadenoma  . Tee without cardioversion N/A 12/09/2015    Procedure: TRANSESOPHAGEAL ECHOCARDIOGRAM (TEE);  Surgeon: Jerline Pain, MD;  Location: [place];  Service: Cardiovascular;  Laterality: N/A;  . Cardioversion N/A 12/09/2015    Procedure: CARDIOVERSION;  Surgeon: Jerline Pain, MD;  Location: [place];   Service: Cardiovascular;  Laterality: N/A;     Current Outpatient Prescriptions  Medication Sig Dispense Refill  . calcium carbonate (TUMS - DOSED IN MG ELEMENTAL CALCIUM) 500 MG chewable tablet Chew 1 tablet by mouth 2 (two) times daily.    Marland Kitchen diltiazem (CARDIZEM CD) 120 MG 24 hr capsule Take 120 mg by mouth every evening.    . diltiazem (CARDIZEM CD) 240 MG 24 hr capsule Take 240 mg by mouth every morning.     Marland Kitchen FERREX 150 150 MG capsule TAKE ONE CAPSULE BY MOUTH EVERY DAY 30 capsule 2  . gabapentin (NEURONTIN) 100 MG capsule Take 100 mg by mouth 3 (three) times daily as needed (nerve pain).    . hydrochlorothiazide (HYDRODIURIL) 25 MG tablet Take 0.5 tablets (12.5 mg total) by mouth daily. 45 tablet 1  . levothyroxine (SYNTHROID, LEVOTHROID) 25 MCG tablet Take 1 tablet (25 mcg total) by mouth daily before breakfast. 90 tablet 1  . lisinopril (PRINIVIL,ZESTRIL) 20 MG tablet Take 20 mg by mouth daily.    . Magnesium 500 MG TABS Take 1 tablet by mouth daily.    . metoprolol (LOPRESSOR) 50 MG tablet Take 1 tablet (50 mg total) by mouth 2 (two) times daily. 180 tablet 1  . Multiple Vitamin (MULITIVITAMIN WITH MINERALS) TABS Take 1 tablet by mouth daily.    . pantoprazole (PROTONIX) 40 MG tablet Take 40 mg by mouth daily.    . potassium chloride (K-DUR,KLOR-CON) 10 MEQ tablet Take 10 mEq by mouth 2 (two) times daily.    . pravastatin (PRAVACHOL) 20 MG tablet TAKE 1 TABLET (20 MG TOTAL) BY MOUTH EVERY EVENING. 30 tablet 5  . pregabalin (LYRICA) 50 MG capsule Take 1 capsule (50 mg total) by mouth 2 (two) times daily. 60 capsule 1  . warfarin (COUMADIN) 5 MG tablet Take as directed by coumadin clinic 100 tablet 0  . zolpidem (AMBIEN CR) 6.25 MG CR tablet Take 1 tablet (6.25 mg total) by mouth at bedtime. 30 tablet 2  . [DISCONTINUED] diphenhydrAMINE (BENADRYL) 25 MG tablet Take 25 mg by mouth every 6 (six) hours as needed.      . [DISCONTINUED] IRON PO Take 1 tablet by mouth daily.     .  [DISCONTINUED] potassium chloride (K-DUR) 10 MEQ tablet Take 1 tablet (10 mEq total) by mouth 2 (two) times daily. 90 tablet 0   No current facility-administered medications for this visit.    Allergies:   Celebrex and Lipitor    Social History:  The patient  reports that she quit smoking about 5 years ago. Her smoking use included Cigarettes. She has never used smokeless tobacco. She reports that she does not drink alcohol or use illicit drugs.   Family History:  The patient's family history includes Cancer in her brother; Dementia in her father; Stroke in her mother.  ROS:  Please see the history of present illness.   Otherwise, review of systems are positive for none.   All other systems are reviewed and negative.    PHYSICAL EXAM: VS:  BP 128/60 mmHg  Pulse 60  Ht 5\' 6"  (1.676 m)  Wt 127 lb 12.8 oz (57.97 kg)  BMI 20.64 kg/m2 , BMI Body mass index is 20.64 kg/(m^2). GEN: Well nourished, well developed, in no acute distress HEENT: normal Neck: no JVD, carotid bruits, or masses Cardiac: RRR; no murmurs, rubs, or gallops,no edema  Respiratory:  clear to auscultation bilaterally, normal work of breathing GI: soft, nontender, nondistended, + BS MS: no deformity or atrophy Skin: warm and dry, no rash Neuro:  Strength and sensation are intact Psych: euthymic mood, full affect   EKG:  EKG is not ordered today.    Recent Labs: 06/09/2015: ALT 12 11/14/2015: TSH 0.616 12/06/2015: BUN 22; Creat 1.13*; Hemoglobin 13.9; Platelets 223; Potassium 4.3; Sodium 134*    Lipid Panel    Component Value Date/Time   CHOL 175 06/09/2015 0911   TRIG 103.0 06/09/2015 0911   HDL 59.90 06/09/2015 0911   CHOLHDL 3 06/09/2015 0911   VLDL 20.6 06/09/2015 0911   LDLCALC 95 06/09/2015 0911      Wt Readings from Last 3 Encounters:  02/09/16 127 lb 12.8 oz (57.97 kg)  12/09/15 125 lb (56.7 kg)  12/06/15 125 lb (56.7 kg)         ASSESSMENT AND PLAN:  1. Paroxysmal atrial  fibrillation, maintaining normal sinus rhythmFollowing recent TEE cardioversion of 12/09/15 2. Essential hypertension 3. Recent surgery on left patella by Dr. Rodell Perna, doing well 4. Peripheral neuropathy. 5. Hypercholesterolemia on statin 6. Decreased pulses in lower extremities.However she had arterial Dopplers of her lower extremities on 06/10/15 which showed normal arterial circulation to the lower extremities bilaterally.  She thinks that in retrospect her leg pain may have been coming from her back.     Current medicines are reviewed at length with the patient today.  The patient does not have concerns regarding medicines.  The following changes have been made:  no change  Labs/ tests ordered today include:  No orders of the defined types were placed in this encounter.     Disposition:  Continue current medication.  Following my retirement she will follow-up for cardiology with Dr. Marlou Porch in 6 months.   Berna Spare MD 02/09/2016 2:44 PM    [address], [place], [place]  [address] Phone: [phone]; Fax: [phone]

## 2016-02-16 DIAGNOSIS — M5416 Radiculopathy, lumbar region: Secondary | ICD-10-CM | POA: Diagnosis not present

## 2016-02-27 DIAGNOSIS — C44329 Squamous cell carcinoma of skin of other parts of face: Secondary | ICD-10-CM | POA: Diagnosis not present

## 2016-02-27 DIAGNOSIS — D485 Neoplasm of uncertain behavior of skin: Secondary | ICD-10-CM | POA: Diagnosis not present

## 2016-02-27 DIAGNOSIS — L718 Other rosacea: Secondary | ICD-10-CM | POA: Diagnosis not present

## 2016-03-01 ENCOUNTER — Other Ambulatory Visit: Payer: Self-pay | Admitting: Cardiology

## 2016-03-01 ENCOUNTER — Ambulatory Visit (INDEPENDENT_AMBULATORY_CARE_PROVIDER_SITE_OTHER): Payer: Medicare Other | Admitting: *Deleted

## 2016-03-01 DIAGNOSIS — I4891 Unspecified atrial fibrillation: Secondary | ICD-10-CM | POA: Diagnosis not present

## 2016-03-01 DIAGNOSIS — Z5181 Encounter for therapeutic drug level monitoring: Secondary | ICD-10-CM

## 2016-03-01 LAB — POCT INR: INR: 2.8

## 2016-03-02 ENCOUNTER — Other Ambulatory Visit: Payer: Self-pay | Admitting: Family Medicine

## 2016-03-02 NOTE — Telephone Encounter (Signed)
This was last filled for 90d in December (has diltiazem or Cardizem CD listed in her med list, same generic med). She recently saw cardiologist and meds weren't changed. Refill done.

## 2016-03-02 NOTE — Telephone Encounter (Signed)
Doesn't look like you have filled this. Is this ok to refill?

## 2016-03-12 ENCOUNTER — Telehealth: Payer: Self-pay | Admitting: Family Medicine

## 2016-03-12 NOTE — Telephone Encounter (Signed)
Rcvd refill request for Zolpidem 6.25mg  & 90 day supply of Gabapentin 100mg  to NEW PHARMACY at [facility]

## 2016-03-12 NOTE — Telephone Encounter (Signed)
Left message for patient to return my call.

## 2016-03-12 NOTE — Telephone Encounter (Signed)
[facility] for #90 of zolpidem (not to be taken by patient prior to 3/28, last filled 12/28) no refill.  Very confusing as to what she is taking, lyrica vs gabapentin.  These have both been refilled in January. She can't just keep switching back and forth at her own discretion (as we previously told her when she called for Lyrica refill in January, after we had changed her gabapentin dose and refilled). She shouldn't be taking BOTH of these meds together (side effects are greater), needs to decide to take one or the other. See what she is actually taking

## 2016-03-14 MED ORDER — ZOLPIDEM TARTRATE ER 6.25 MG PO TBCR: 6.2500 mg | EXTENDED_RELEASE_TABLET | Freq: Every day | ORAL | Status: DC

## 2016-03-14 MED ORDER — GABAPENTIN 100 MG PO CAPS: 200.0000 mg | ORAL_CAPSULE | Freq: Two times a day (BID) | ORAL | Status: DC

## 2016-03-14 NOTE — Telephone Encounter (Signed)
Patient advised and rx sent for gabapentin and faxed one for ambien.

## 2016-03-14 NOTE — Telephone Encounter (Signed)
Spoke with patient and she is taking ONLY gabapentin, not taking Lyrica at all. She takes 2 gabapentin usually around 6pm each night. Some nights she will take another around 11-12 if needed. She asked if it was okay to take 2 more around 11-12 if needed? Please advise.

## 2016-03-14 NOTE — Telephone Encounter (Signed)
Yes, that is fine.  Directions can state 2 po BID

## 2016-03-21 ENCOUNTER — Other Ambulatory Visit: Payer: Self-pay | Admitting: *Deleted

## 2016-03-21 NOTE — Telephone Encounter (Signed)
pravastatin (PRAVACHOL) 20 MG tablet 30 tablet 6 03/01/2016      Sig: TAKE 1 TABLET (20 MG TOTAL) BY MOUTH EVERY EVENING.    E-Prescribing Status: Receipt confirmed by pharmacy (03/01/2016 11:00 AM EST)     Pharmacy    [id number] - [place], [place] - [address]     [facility]formed [facility] of [facility] refills & informed them to call & have them moved

## 2016-03-27 ENCOUNTER — Other Ambulatory Visit: Payer: Self-pay | Admitting: Cardiology

## 2016-03-27 NOTE — Telephone Encounter (Signed)
REFILL 

## 2016-03-28 ENCOUNTER — Other Ambulatory Visit: Payer: Self-pay | Admitting: *Deleted

## 2016-03-29 ENCOUNTER — Other Ambulatory Visit: Payer: Self-pay | Admitting: Family Medicine

## 2016-03-29 ENCOUNTER — Ambulatory Visit (INDEPENDENT_AMBULATORY_CARE_PROVIDER_SITE_OTHER): Payer: Medicare Other | Admitting: *Deleted

## 2016-03-29 ENCOUNTER — Other Ambulatory Visit: Payer: Self-pay | Admitting: Cardiology

## 2016-03-29 DIAGNOSIS — Z5181 Encounter for therapeutic drug level monitoring: Secondary | ICD-10-CM | POA: Diagnosis not present

## 2016-03-29 DIAGNOSIS — I4891 Unspecified atrial fibrillation: Secondary | ICD-10-CM | POA: Diagnosis not present

## 2016-03-29 LAB — POCT INR: INR: 1.7

## 2016-03-30 IMAGING — RF DG KNEE COMPLETE 4+V*L*
1 series · 4 of 4 positions shown · non-contrast
Comparison: None.

CLINICAL DATA: ORIF of left patellar fracture

EXAM:
LEFT KNEE - COMPLETE 4+ VIEW; DG C-ARM 61-120 MIN

[Series 1: run · 4 of 4 slices shown]
[im 1/4]
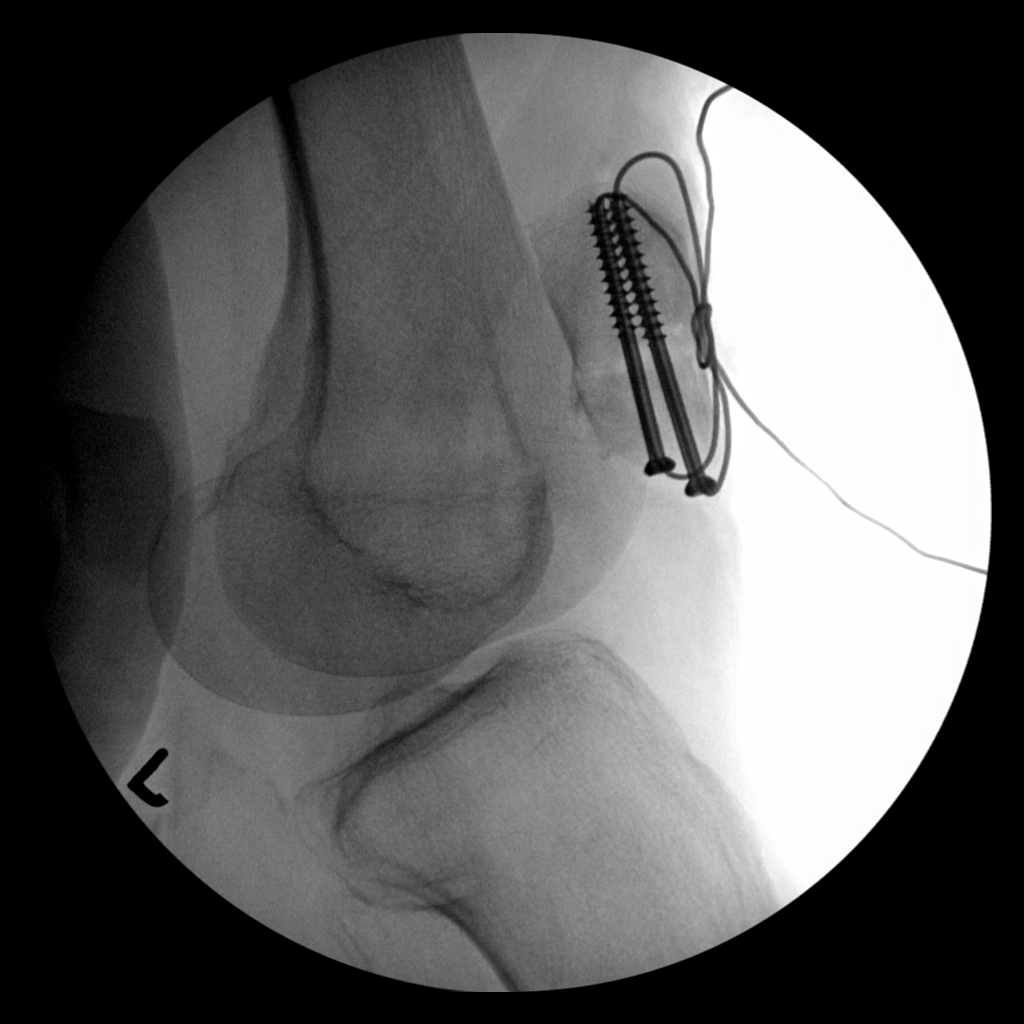
[im 2/4]
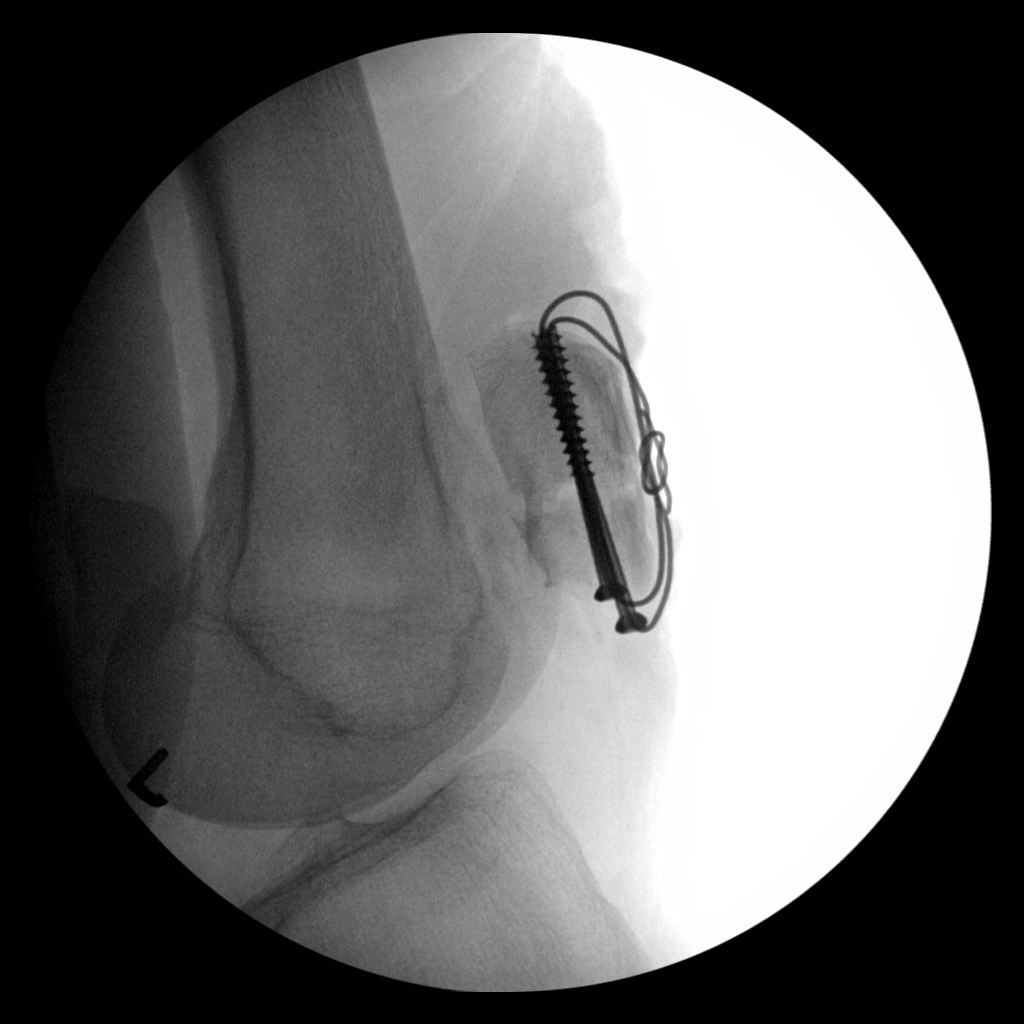
[im 3/4]
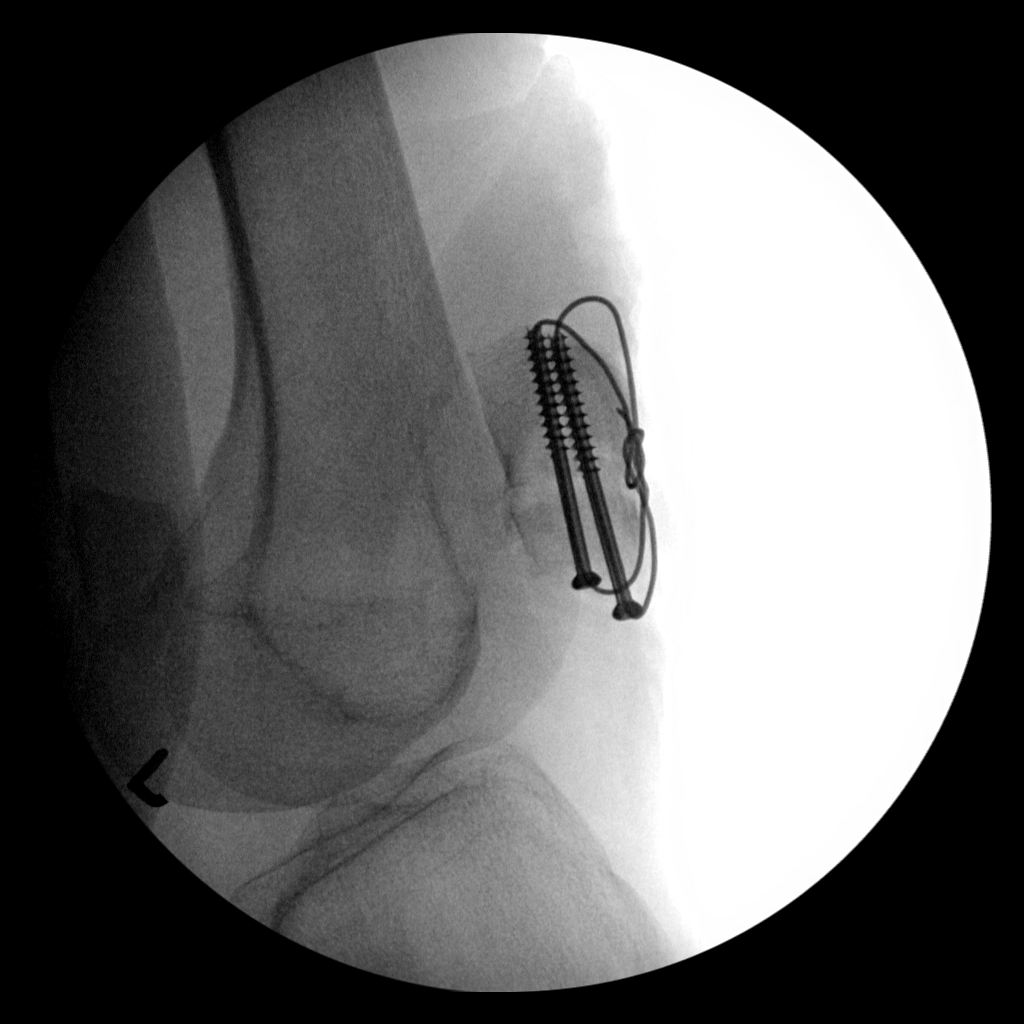
[im 4/4]
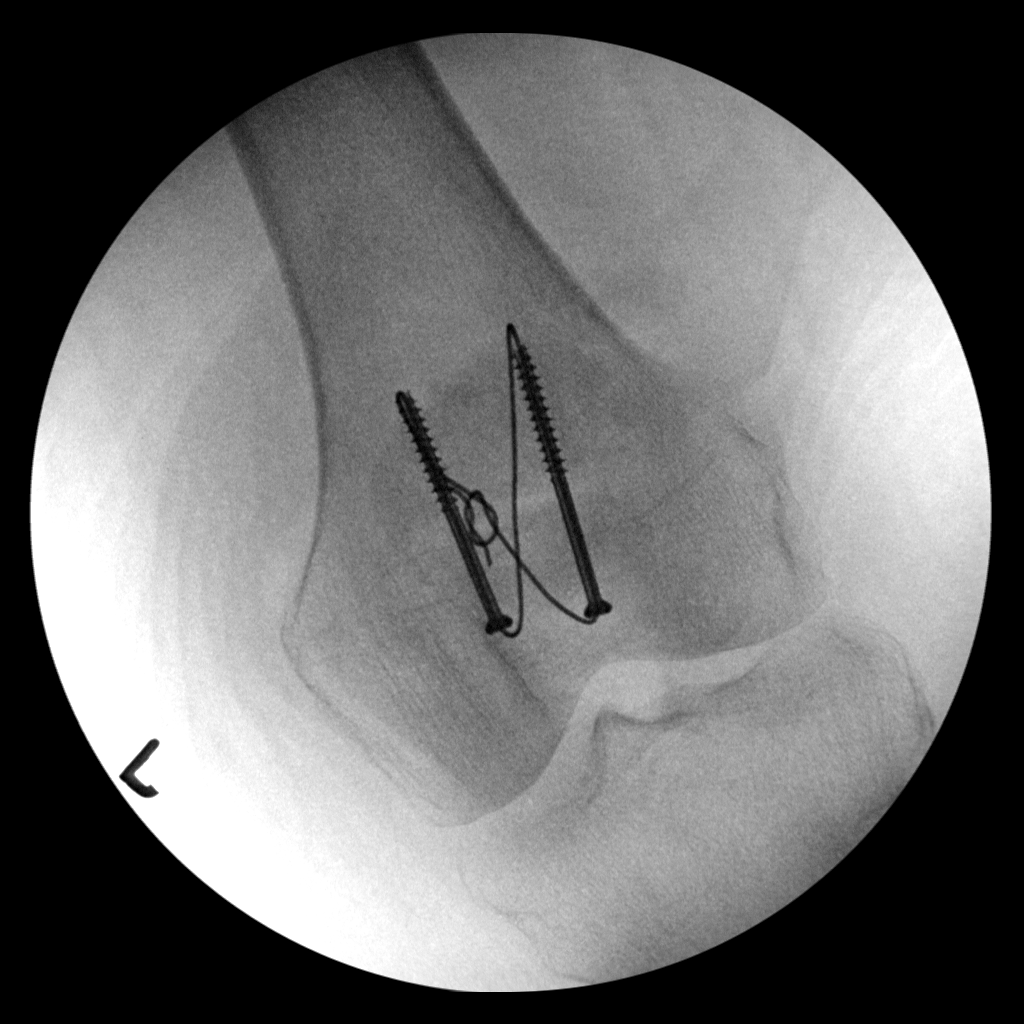

[4 of 4 positions shown; findings below may reference images not displayed]

FINDINGS: Four spot films were obtained intraoperatively. 8.4 seconds of
fluoroscopy was utilized. There are 2 fixation screws identified
with a fixation wire traversing the course of the 2 screws
approximating the patellar fragments. There are in near anatomic
alignment.
IMPRESSION: ORIF left patellar fracture

## 2016-04-04 DIAGNOSIS — C44329 Squamous cell carcinoma of skin of other parts of face: Secondary | ICD-10-CM | POA: Diagnosis not present

## 2016-04-04 DIAGNOSIS — Z85828 Personal history of other malignant neoplasm of skin: Secondary | ICD-10-CM | POA: Diagnosis not present

## 2016-04-13 ENCOUNTER — Other Ambulatory Visit: Payer: Self-pay | Admitting: Family Medicine

## 2016-04-16 ENCOUNTER — Encounter: Payer: Self-pay | Admitting: Family Medicine

## 2016-04-16 ENCOUNTER — Ambulatory Visit (INDEPENDENT_AMBULATORY_CARE_PROVIDER_SITE_OTHER): Payer: Medicare Other | Admitting: Family Medicine

## 2016-04-16 VITALS — BP 124/80 | HR 64 | Temp 97.6°F | Wt 124.8 lb

## 2016-04-16 DIAGNOSIS — R21 Rash and other nonspecific skin eruption: Secondary | ICD-10-CM | POA: Diagnosis not present

## 2016-04-16 MED ORDER — TRIAMCINOLONE ACETONIDE 0.1 % EX CREA: 1.0000 "application " | TOPICAL_CREAM | Freq: Two times a day (BID) | CUTANEOUS | Status: DC

## 2016-04-16 NOTE — Patient Instructions (Signed)
Try the steroid cream and see if you notice improvement. Use cool compresses.

## 2016-04-16 NOTE — Progress Notes (Signed)
   Subjective:    Patient ID: Lisa Lucero, female    DOB: 12-11-1935, 81 y.o.   MRN: [id number]  HPI Chief Complaint  Patient presents with  . rash    rash since friday. rash on both breasts and back   She is here with complaints of pruritic rash to right side of her back for about a week and to right and left upper breasts for 3-4 days. She has been using caladryl lotion without much relief. Denies fever, chills or rash to any other parts of her body. No other complaints or concerns today.    Review of Systems Pertinent positives and negatives in the history of present illness.     Objective:   Physical Exam BP 124/80 mmHg  Pulse 64  Temp(Src) 97.6 F (36.4 C) (Oral)  Wt 124 lb 12.8 oz (56.609 kg)  Discreet pinpoint papules that are scabbed and in different stages of healing,  in a cluster to right mid back area, left and right upper breast, no drainage, no surrounding erythema, induration or fluctuance.       Assessment & Plan:  Rash and nonspecific skin eruption  Discussed that the rash appears to be healing, possible folliculitis vs dermatitis,  will prescribe triamcinolone cream to apply for the next 3-4 days and see if she notices improvement in symptoms. Discussed this is not for long term use. Dr. Redmond School also examined patient and is in agreement with plan of care. She will follow up if not improving. Also discussed using cool compresses to area for to control itching.

## 2016-04-19 ENCOUNTER — Ambulatory Visit (INDEPENDENT_AMBULATORY_CARE_PROVIDER_SITE_OTHER): Payer: Medicare Other | Admitting: *Deleted

## 2016-04-19 DIAGNOSIS — I4891 Unspecified atrial fibrillation: Secondary | ICD-10-CM

## 2016-04-19 DIAGNOSIS — Z5181 Encounter for therapeutic drug level monitoring: Secondary | ICD-10-CM

## 2016-04-19 LAB — POCT INR: INR: 1.6

## 2016-04-23 ENCOUNTER — Other Ambulatory Visit: Payer: Self-pay | Admitting: Cardiology

## 2016-05-03 ENCOUNTER — Ambulatory Visit (INDEPENDENT_AMBULATORY_CARE_PROVIDER_SITE_OTHER): Payer: Medicare Other

## 2016-05-03 DIAGNOSIS — I4891 Unspecified atrial fibrillation: Secondary | ICD-10-CM

## 2016-05-03 DIAGNOSIS — Z5181 Encounter for therapeutic drug level monitoring: Secondary | ICD-10-CM | POA: Diagnosis not present

## 2016-05-03 LAB — POCT INR: INR: 2.3

## 2016-05-07 ENCOUNTER — Other Ambulatory Visit: Payer: Self-pay | Admitting: Family Medicine

## 2016-05-07 NOTE — Telephone Encounter (Signed)
Left message for pt to call me back 

## 2016-05-07 NOTE — Telephone Encounter (Signed)
Patient advised.

## 2016-05-07 NOTE — Telephone Encounter (Signed)
Is this okay to refill? 

## 2016-05-07 NOTE — Telephone Encounter (Signed)
Pt is coming in to see Dr. Tomi Bamberger next week. Denying med

## 2016-05-07 NOTE — Telephone Encounter (Signed)
According to the chart, this hasn't been rx'd since 2013? This is for suppository, which would treat internal hemorrhoids.  I believe she has most recently been using OTC hydrocortisone creams (ie rectocare, and others) for external irritation.   She has OV scheduled for 5/15, would prefer to wait for visit (and continue to use OTC's if needed, until then.  They make OTC anusol HC suppositories, if she truly feels she needs suppository, get the OTC).

## 2016-05-07 NOTE — Telephone Encounter (Signed)
She should not need this long term. If she is still having issues with the rash I saw her for last month then I recommend she be seen again. She will need to follow up with PCP, Dr. Tomi Bamberger since I will be out of office.

## 2016-05-09 ENCOUNTER — Encounter: Payer: Self-pay | Admitting: Family Medicine

## 2016-05-09 ENCOUNTER — Ambulatory Visit (INDEPENDENT_AMBULATORY_CARE_PROVIDER_SITE_OTHER): Payer: Medicare Other | Admitting: Family Medicine

## 2016-05-09 VITALS — BP 124/72 | HR 60 | Temp 96.7°F | Ht 66.0 in | Wt 124.6 lb

## 2016-05-09 DIAGNOSIS — R102 Pelvic and perineal pain: Secondary | ICD-10-CM

## 2016-05-09 DIAGNOSIS — R3915 Urgency of urination: Secondary | ICD-10-CM | POA: Diagnosis not present

## 2016-05-09 DIAGNOSIS — I4891 Unspecified atrial fibrillation: Secondary | ICD-10-CM | POA: Diagnosis not present

## 2016-05-09 DIAGNOSIS — R32 Unspecified urinary incontinence: Secondary | ICD-10-CM | POA: Diagnosis not present

## 2016-05-09 DIAGNOSIS — R300 Dysuria: Secondary | ICD-10-CM | POA: Diagnosis not present

## 2016-05-09 DIAGNOSIS — K6289 Other specified diseases of anus and rectum: Secondary | ICD-10-CM | POA: Diagnosis not present

## 2016-05-09 LAB — POCT URINALYSIS DIPSTICK
Bilirubin, UA: NEGATIVE
Glucose, UA: NEGATIVE
KETONES UA: NEGATIVE
Nitrite, UA: NEGATIVE
PH UA: 7
PROTEIN UA: NEGATIVE
RBC UA: NEGATIVE
SPEC GRAV UA: 1.02
UROBILINOGEN UA: NEGATIVE

## 2016-05-09 NOTE — Patient Instructions (Signed)
  Please continue to drink plenty of water.  We are sending your urine for culture and will contact you if antibiotics are needed.  Please bring in ALL of your medications to your med check visit next week. Keep the ones that you take regularly in one bag, and the ones that you take as needed in another. If you have a bunch of medications that you don't take at all, bring these in, in a bag labeled "not taking".  Please bring in any records you have regarding colonoscopy or who your gastroenterologist is. I'm concerned about your ongoing rectal complaints, and want to know what type of evaluation you have had so far. Also bring in any records or names from Gynecologists that you have seen.  Please DO NOT TAKE ALEVE--use Tylenol (acetaminophen product) if needed for pain.  This is due to you being on blood thinners, as well as your history of stomach problems.

## 2016-05-09 NOTE — Progress Notes (Signed)
Chief Complaint  Patient presents with  . Urinary Frequency    and burning as well as urgency. Does state that feels a little better today but was at it's worst yesterday.   . RX    needs prescription to have DEXA through [facility].   She had vaginal burning and urinary frequency yesterday. She had urinary urgency, as well as some urge incontinence and increased urinary frequency.  She drank a lot of water yetserday. She is "so much better today"--felt normal when she gave sample today. Denies any lower abdominal pain or kidney pain.  She did have some lower back pain yesterday. She took Aleve yesterday, which helped.  Currently denies back pain.  Denies vaginal discharge, odor, itch, bleeding.  Just burning--very slightly now, in vaginal and rectal area.  She reports not having a GYN, but couldn't tell me who she has seen in the past.  She reports she has a file of records at home that she will bring.  She brings in label for hydrocortisone AC 25mg  suppositories, #30, prescribed on 03/16/16 by Diona Fanti (per label)--pt has no idea who this is; she thinks she called someone, doesn't think she went for a visit anywhere.  She has no idea.  She found this to be helpful, more helpful than the external cream. She denies any rectal bleeding or itching, just burning. She states her last colonoscopy was "two years ago", can't recall the gastroenterologist's name. She currently complains of just mild burning in the rectum.  Review of epic chart: 11/2010 EGD during hospitalization. 09/2010 EGD during hospitalization showing erosive esophagitis  PMH, PSH, SH reviewed  Outpatient Encounter Prescriptions as of 05/09/2016  Medication Sig Note  . calcium carbonate (TUMS - DOSED IN MG ELEMENTAL CALCIUM) 500 MG chewable tablet Chew 1 tablet by mouth 2 (two) times daily.   Marland Kitchen diltiazem (CARDIZEM CD) 120 MG 24 hr capsule Take 1 capsule (120 mg total) by mouth daily. Please call and schedule a six month  follow up appointment with Dr Marlou Porch   . FERREX 150 150 MG capsule TAKE ONE CAPSULE BY MOUTH EVERY DAY   . gabapentin (NEURONTIN) 100 MG capsule Take 2 capsules (200 mg total) by mouth 2 (two) times daily.   . hydrochlorothiazide (HYDRODIURIL) 25 MG tablet Take 0.5 tablets (12.5 mg total) by mouth daily.   Marland Kitchen levothyroxine (SYNTHROID, LEVOTHROID) 25 MCG tablet Take 1 tablet (25 mcg total) by mouth daily before breakfast.   . lisinopril (PRINIVIL,ZESTRIL) 20 MG tablet Take 20 mg by mouth daily.   . Magnesium 500 MG TABS Take 1 tablet by mouth daily.   . metoprolol (LOPRESSOR) 50 MG tablet TAKE 1 TABLET TWICE A DAY   . Multiple Vitamin (MULITIVITAMIN WITH MINERALS) TABS Take 1 tablet by mouth daily.   . pantoprazole (PROTONIX) 40 MG tablet Take 40 mg by mouth daily.   . potassium chloride (K-DUR,KLOR-CON) 10 MEQ tablet Take 10 mEq by mouth 2 (two) times daily.   . pravastatin (PRAVACHOL) 20 MG tablet TAKE 1 TABLET (20 MG TOTAL) BY MOUTH EVERY EVENING.   Marland Kitchen warfarin (COUMADIN) 5 MG tablet TAKE AS DIRECTED BY COUMADIN CLINIC   . zolpidem (AMBIEN CR) 6.25 MG CR tablet Take 1 tablet (6.25 mg total) by mouth at bedtime.   . hydrocortisone (ANUSOL-HC) 25 MG suppository Reported on 05/09/2016 05/09/2016: Received from: External Pharmacy  . metroNIDAZOLE (METROGEL) 0.75 % gel Reported on 05/09/2016 05/09/2016: Received from: External Pharmacy  . triamcinolone cream (KENALOG) 0.1 % Apply  1 application topically 2 (two) times daily. (Patient not taking: Reported on 05/09/2016)   . [DISCONTINUED] clindamycin (CLEOCIN) 300 MG capsule TAKE 1 CAPSULE 3 TIMES DAILY 05/09/2016: Received from: External Pharmacy   No facility-administered encounter medications on file as of 05/09/2016.   She truly isn't aware of her medications or who prescribed them for her. Didn't bring any with her today.  I don't fully trust the med rec above.  Allergies  Allergen Reactions  . Celebrex [Celecoxib] Diarrhea  . Lipitor [Atorvastatin  Calcium] Other (See Comments)    myalgias   ROS:  No fever, chills, headaches, dizziness, chest pain, palpitations, shortness of breath, URI symptoms, cough.  No nausea, vomiting, diarrhea, abdominal pain.  +urinary complaints and rectal and vaginal burning pain as per HPI.  She had a rash a few weeks on breasts (see visit with Vickie), resolved.  Yesterday she noticed some itchy rash recurring on the right back. She admits to scratching at it.  PHYSICAL EXAM: BP 124/72 mmHg  Pulse 60  Temp(Src) 96.7 F (35.9 C) (Tympanic)  Ht 5\' 6"  (1.676 m)  Wt 124 lb 9.6 oz (56.518 kg)  BMI 20.12 kg/m2  Very pleasant, alert, talkative female in no distress She is pleasantly demented--really unsure of her doctors, medications, dates, just of her current symptoms. Skin: A few mild scattered excoriated papules on her right back. No CVA tenderness or spinal tenderness Abdomen: Very mild/minimal suprapubic tenderness. No mass or organomegaly  Heart: regular rhythm,  Borderline bradycardic Lungs: clear bilaterally Neck: no lymphadenopathy or mass Extremities: no edema Neuro: alert, pleasant. Difficulty remembering names, dates, medications, details. Normal cranial nerves, gait, strength.  Urine dip: trace leuks, SG 1.020  ASSESSMENT/PLAN:  Burning with urination - symptoms resolved today. send uring for culture and treat if + - Plan: POCT Urinalysis Dipstick  Urinary incontinence, unspecified incontinence type - Plan: Urine culture  Urinary urgency - Plan: Urine culture  Atrial fibrillation, unspecified type ([place]) - rhythm sounded fairly regular today. Advised to avoid NSAIDs due to her anticoagulation  Vaginal pain - mild burning.  ?taking metrogel? pt to bring in records and meds to next visit. may need to see GYN  Rectal pain - improved some with anusol suppository. review records. f/u with GI due to chronic pain (await records)   Please continue to drink plenty of water.  We are sending  your urine for culture and will contact you if antibiotics are needed.  Please bring in ALL of your medications to your med check visit next week. Keep the ones that you take regularly in one bag, and the ones that you take as needed in another. If you have a bunch of medications that you don't take at all, bring these in, in a bag labeled "not taking".  Please bring in any records you have regarding colonoscopy or who your gastroenterologist is. I'm concerned about your ongoing rectal complaints, and want to know what type of evaluation you have had so far. Also bring in any records or names from Gynecologists that you have seen.  Please DO NOT TAKE ALEVE--use Tylenol (acetaminophen product) if needed for pain.  This is due to you being on blood thinners, as well as your history of stomach problems.    F/u Monday as scheduled for med check, with records and all meds.

## 2016-05-11 LAB — URINE CULTURE
Colony Count: NO GROWTH
ORGANISM ID, BACTERIA: NO GROWTH

## 2016-05-14 ENCOUNTER — Other Ambulatory Visit: Payer: Self-pay | Admitting: *Deleted

## 2016-05-14 ENCOUNTER — Ambulatory Visit (INDEPENDENT_AMBULATORY_CARE_PROVIDER_SITE_OTHER): Payer: Medicare Other | Admitting: Family Medicine

## 2016-05-14 ENCOUNTER — Encounter: Payer: Self-pay | Admitting: Family Medicine

## 2016-05-14 VITALS — BP 110/68 | HR 56 | Ht 66.0 in | Wt 125.2 lb

## 2016-05-14 DIAGNOSIS — G8929 Other chronic pain: Secondary | ICD-10-CM

## 2016-05-14 DIAGNOSIS — K6289 Other specified diseases of anus and rectum: Secondary | ICD-10-CM | POA: Diagnosis not present

## 2016-05-14 DIAGNOSIS — I10 Essential (primary) hypertension: Secondary | ICD-10-CM | POA: Diagnosis not present

## 2016-05-14 DIAGNOSIS — E041 Nontoxic single thyroid nodule: Secondary | ICD-10-CM

## 2016-05-14 DIAGNOSIS — E042 Nontoxic multinodular goiter: Secondary | ICD-10-CM | POA: Diagnosis not present

## 2016-05-14 DIAGNOSIS — E785 Hyperlipidemia, unspecified: Secondary | ICD-10-CM | POA: Diagnosis not present

## 2016-05-14 DIAGNOSIS — I700 Atherosclerosis of aorta: Secondary | ICD-10-CM

## 2016-05-14 DIAGNOSIS — E039 Hypothyroidism, unspecified: Secondary | ICD-10-CM

## 2016-05-14 DIAGNOSIS — Z5181 Encounter for therapeutic drug level monitoring: Secondary | ICD-10-CM | POA: Diagnosis not present

## 2016-05-14 DIAGNOSIS — I4891 Unspecified atrial fibrillation: Secondary | ICD-10-CM

## 2016-05-14 DIAGNOSIS — R102 Pelvic and perineal pain: Secondary | ICD-10-CM | POA: Diagnosis not present

## 2016-05-14 DIAGNOSIS — Z7901 Long term (current) use of anticoagulants: Secondary | ICD-10-CM

## 2016-05-14 NOTE — Progress Notes (Signed)
Chief Complaint  Patient presents with  . Hypertension    6 month follow up, nonfasting. Rash is better.    Left ear feels plugged for about a week.  She has some ringing, decreased hearing. Denies popping. Denies allergy symptoms, no sniffles, sneeze, runny nose. She also reports having some dizziness, feeling like she staggers. No nausea/vomiting, no other neurologic symptoms, headaches, or lightheadedness. No ear pain.  Hypertension follow-up:  Blood pressures are checked at home sometimes, running 110/68 earlier today.   Denies dizziness, headaches, chest pain.  Denies side effects of medications. She saw Dr. Mare Ferrari in 12/2015 for her afib. She was found to be back in afib, and had TEE and DCCV by Dr. Marlou Porch.  It appears that diltiazem '120mg'$  was added to the evenings at that time, and continues on the '240mg'$  morning dose. She had f/u with cardiology in 02/2016, and due to see Dr. Marlou Porch in 07/2016 (Dr. Mare Ferrari retired). She was last seen in anti-coag clinic 5/4, INR was 2.3, due again in 3wks, to continue current dose of 1/2 tablet daily except 1 full tablet on Sun, Tues, Thurs. She denies any bleeding, bruising  Hyperlipidemia follow-up:  Patient is reportedly following a low-fat, low cholesterol diet.  Compliant with medications and denies medication side effects.  Hypothyroidism and thyroid nodules--she no longer sees Dr. Chalmers Cater.  Compliant with taking her thyroid medication daily--takes with her blood pressure medications. Takes the calcium at night.  Thyroid ultrasound is due again 06/2016. Last u/s 06/2014:  1. Thyromegaly with bilateral nodules. Slight interval enlargement of dominant right lesion. Findings meet consensus criteria for biopsy. Ultrasound-guided fine needle aspiration should be considered, as per the consensus statement: Management of Thyroid Nodules Detected at [place]: Society of Radiologists in [place]. Radiology 2005; [id number]. She  did not undergo biopsy; due for recheck in July.  Denies fatigue, weight changes, no hair/skin/bowel changes. Denies any changes to the size of the nodule, or any difficulty swallowing.  Chronic pelvic and rectal pain--improved today, but remains somewhat tender at the vagina. vagisil helps with the discomfort. Doesn't have a gynecologist.  Brings in GI records from home-- Documents /directions from small bowel capsule endoscopy from 11/2010 No mention of GI doctor, or colonoscopy in this file, just a few papers only.  On PPI longterm--h/o GI bleed and iron deficiency anemia (2011).  Denies any heartburn, dysphagia.  Looking at other notes and doctors from her prescription bottles: 2013--Dr. Kozlow 2013 Dr. Brantley Stage (general surgeon, for gall bladder) 2017--Dr. Jarome Matin (derm)  PMH, PSH, SH reviewed.  Outpatient Encounter Prescriptions as of 05/14/2016  Medication Sig Note  . calcium carbonate (TUMS - DOSED IN MG ELEMENTAL CALCIUM) 500 MG chewable tablet Chew 1 tablet by mouth 2 (two) times daily.   Marland Kitchen diltiazem (CARDIZEM CD) 120 MG 24 hr capsule Take 1 capsule (120 mg total) by mouth daily. Please call and schedule a six month follow up appointment with Dr Marlou Porch 05/14/2016: Taking '120mg'$  in the evening  . diltiazem (DILACOR XR) 240 MG 24 hr capsule Take 240 mg by mouth daily. 05/14/2016: Taking '240mg'$  in the morning  . FERREX 150 150 MG capsule TAKE ONE CAPSULE BY MOUTH EVERY DAY   . gabapentin (NEURONTIN) 100 MG capsule Take 2 capsules (200 mg total) by mouth 2 (two) times daily.   . hydrochlorothiazide (HYDRODIURIL) 25 MG tablet Take 0.5 tablets (12.5 mg total) by mouth daily.   . hydrocortisone (ANUSOL-HC) 25 MG suppository Reported on 05/09/2016 05/09/2016: Received from:  External Pharmacy  . levothyroxine (SYNTHROID, LEVOTHROID) 25 MCG tablet Take 1 tablet (25 mcg total) by mouth daily before breakfast.   . lisinopril (PRINIVIL,ZESTRIL) 20 MG tablet Take 20 mg by mouth daily. 05/14/2016:  Twice daily (she changed, and thinks she does it as bottle states)  . Magnesium 400 MG CAPS Take 1 tablet by mouth daily.   . metoprolol (LOPRESSOR) 50 MG tablet TAKE 1 TABLET TWICE A DAY   . Multiple Vitamin (MULITIVITAMIN WITH MINERALS) TABS Take 1 tablet by mouth daily.   . pantoprazole (PROTONIX) 40 MG tablet Take 40 mg by mouth daily.   . potassium chloride (K-DUR,KLOR-CON) 10 MEQ tablet Take 10 mEq by mouth 2 (two) times daily.   . pravastatin (PRAVACHOL) 20 MG tablet TAKE 1 TABLET (20 MG TOTAL) BY MOUTH EVERY EVENING.   Marland Kitchen warfarin (COUMADIN) 5 MG tablet TAKE AS DIRECTED BY COUMADIN CLINIC   . zolpidem (AMBIEN CR) 6.25 MG CR tablet Take 1 tablet (6.25 mg total) by mouth at bedtime.   . metroNIDAZOLE (METROGEL) 0.75 % gel Reported on 05/14/2016 05/14/2016: Uses prn to skin, not using recently (rx'd by Dr. Jarome Matin in 02/2016)  . triamcinolone cream (KENALOG) 0.1 % Apply 1 application topically 2 (two) times daily. (Patient not taking: Reported on 05/09/2016)   . [DISCONTINUED] Magnesium 500 MG TABS Take 1 tablet by mouth daily.    No facility-administered encounter medications on file as of 05/14/2016.    Bag of meds NOT taking include: Ondansetron, trazadone 50-, lyrica, L-thyroxine 50 (bc changed to 25), fenofibrate '134mg'$  (from 06/2014).  Prn meds: Creams: benadryl, vagicaine, hemorrhoid cream, azelastine nasal spray 0.1%, metrogel 0.75% (for itchy rash on back)--02/2016 from Dr. Jarome Matin   ROS: no fever, chills, weight changes, normal appetite. No bleeding, bruising, rash. No dysphagia, heartburn, melena, hematochezia, urinary complaints, vaginal discharge. Slight vaginal and rectal burning currently. No numbness, tingling. +mild vertigo and left ear plugging. No chest pain, palpitations, headaches, dizziness, shortness of breath, edema.  Rash resolved, slight intermittent itching on her back. Moods are good.  PHYSICAL EXAM: BP 110/68 mmHg  Pulse 56  Ht '5\' 6"'$  (1.676 m)  Wt 125 lb  3.2 oz (56.79 kg)  BMI 20.22 kg/m2 Well developed, pleasant, elderly female in no distress. She is in good spirits, laughing. HEENT: PERRL, EOMI, conjunctiva and sclera are clear.   L TM and EAC normal. Nasal mucosa is moderately edematous, slightly red, clear mucus. Sinuses nontender, OP is clear Neck: no lymphadenopathy. Enlarged right thyroid, multiple large nodules palpable, nontender Heart: Regular rate and rhythm, no murmur Lungs: clear bilaterally Back: no spinal or CVA tenderness Abdomen: soft, nontender, no organomegaly or mass  Extremities: no edema, 2+ pulses Psych: normal mood, affect, hygiene and grooming Neuro: alert and oriented (see below for modified MMSE--not all questions asked.) normal strength, gait.   05/14/16 Tuesday (wrong) [place] [employer] Milinda Pointer, [place] Normal recall and late recall Sentence normal Normal naming objects Concentration--spelled WORLD backwards correctly and very quickly, so then tried serial 7's.  Struggled a little more with that, but didn't lose concentration much. 93/86/79/68/61 (error noted) Normal sentence writing, and reading/doing (closing eyes). Normal diagram drawing.   ASSESSMENT/PLAN:   Return for fasting labs Lipids, c-met, TSH, CBC   Will ensure that thyroid u/s is scheduled for July (to f/u on thyroid nodules)  Refer to GYN and GI for consults regarding chronic discomfort in vaginal and rectal areas. Unsure if there could be component of neuropathy (which we are treating with  gabapentin) Vs other etiology   Ear plugging and equilibrium might be related to inner ear, which is likely related to allergies given the appearance on nasal exam. Start taking claritin once daily. Consider doing sinus rinses. Follow up with an ENT if you have persistent or worsening symptoms.  Please try and find out the date of your shingles vaccine and last tetanus shot. You only need the shingles shot once (but I need to know the date). The  tetanus is every 10 years.  If it has been more than 10 years, you need to get another. Medicare doesn't pay for it, but it is covered (usually) by part D, so you can get it from the pharmacy with a prescription.  Let me know if you need a prescription  (if it has been more than 10 years).

## 2016-05-14 NOTE — Patient Instructions (Signed)
Ear plugging and equilibrium might be related to inner ear, which is likely related to allergies given the appearance on nasal exam. Start taking claritin once daily. Consider doing sinus rinses. Follow up with an ENT if you have persistent or worsening symptoms.  Your blood pressure is excellent. Continue your current medications. Return fasting for bloodwork. You are due for follow-up thyroid ultrasound in July.  We will be referring you to gynecologist and gastroenterologist for evaluation, given your chronic problems with vaginal and rectal pain. It appears that your last GI evaluation was back in 2011, related to a GI bleed at that time.   Please try and find out the date of your shingles vaccine and last tetanus shot. You only need the shingles shot once (but I need to know the date). The tetanus is every 10 years.  If it has been more than 10 years, you need to get another. Medicare doesn't pay for it, but it is covered (usually) by part D, so you can get it from the pharmacy with a prescription.  Let me know if you need a prescription  (if it has been more than 10 years).

## 2016-05-15 ENCOUNTER — Telehealth: Payer: Self-pay

## 2016-05-15 NOTE — Telephone Encounter (Signed)
She had negative urine culture on 5/10, and was NOT complaining of symptoms when seen yesterday.  I wonder if she used OTC azo or other med to treat her symptoms, to cause her urine to be orange.  If not, it may be very concentrated, and be sure to have her drink plenty of water. Please be sure GYN appt is scheduled.  Thanks

## 2016-05-15 NOTE — Telephone Encounter (Signed)
Spoke with pt- she reports that she is having burning pain with urination and having pelvic pain with walking around. Her urine is dark orange. Pt questions if there is anything else that can be done in the meantime until she is referred to GYN. Pt said she has already heard from GI but not GYN. Please advise.  Victorino December

## 2016-05-16 ENCOUNTER — Encounter: Payer: Self-pay | Admitting: Gastroenterology

## 2016-05-16 NOTE — Telephone Encounter (Signed)
Left message for patient to return my call.

## 2016-05-17 ENCOUNTER — Other Ambulatory Visit: Payer: Medicare Other

## 2016-05-17 DIAGNOSIS — E039 Hypothyroidism, unspecified: Secondary | ICD-10-CM | POA: Diagnosis not present

## 2016-05-17 DIAGNOSIS — I10 Essential (primary) hypertension: Secondary | ICD-10-CM

## 2016-05-17 DIAGNOSIS — Z7901 Long term (current) use of anticoagulants: Secondary | ICD-10-CM | POA: Diagnosis not present

## 2016-05-17 DIAGNOSIS — Z5181 Encounter for therapeutic drug level monitoring: Secondary | ICD-10-CM | POA: Diagnosis not present

## 2016-05-17 DIAGNOSIS — E785 Hyperlipidemia, unspecified: Secondary | ICD-10-CM

## 2016-05-17 LAB — CBC WITH DIFFERENTIAL/PLATELET
Basophils Absolute: 50 cells/uL (ref 0–200)
Basophils Relative: 1 %
EOS PCT: 7 %
Eosinophils Absolute: 350 cells/uL (ref 15–500)
HCT: 37.3 % (ref 35.0–45.0)
Hemoglobin: 12.7 g/dL (ref 11.7–15.5)
LYMPHS ABS: 1150 {cells}/uL (ref 850–3900)
Lymphocytes Relative: 23 %
MCH: 31.9 pg (ref 27.0–33.0)
MCHC: 34 g/dL (ref 32.0–36.0)
MCV: 93.7 fL (ref 80.0–100.0)
MPV: 9.3 fL (ref 7.5–12.5)
Monocytes Absolute: 600 cells/uL (ref 200–950)
Monocytes Relative: 12 %
NEUTROS ABS: 2850 {cells}/uL (ref 1500–7800)
Neutrophils Relative %: 57 %
Platelets: 193 10*3/uL (ref 140–400)
RBC: 3.98 MIL/uL (ref 3.80–5.10)
RDW: 13.3 % (ref 11.0–15.0)
WBC: 5 10*3/uL (ref 4.0–10.5)

## 2016-05-17 LAB — LIPID PANEL
Cholesterol: 188 mg/dL (ref 125–200)
HDL: 56 mg/dL (ref >=46)
LDL CALC: 110 mg/dL (ref <130)
TRIGLYCERIDES: 110 mg/dL (ref <150)
Total CHOL/HDL Ratio: 3.4 Ratio (ref <=5.0)
VLDL: 22 mg/dL (ref <30)

## 2016-05-17 LAB — COMPREHENSIVE METABOLIC PANEL
ALT: 12 U/L (ref 6–29)
AST: 22 U/L (ref 10–35)
Albumin: 4.4 g/dL (ref 3.6–5.1)
Alkaline Phosphatase: 59 U/L (ref 33–130)
BILIRUBIN TOTAL: 0.5 mg/dL (ref 0.2–1.2)
BUN: 25 mg/dL (ref 7–25)
CHLORIDE: 93 mmol/L — AB (ref 98–110)
CO2: 28 mmol/L (ref 20–31)
CREATININE: 1.31 mg/dL — AB (ref 0.60–0.93)
Calcium: 9.7 mg/dL (ref 8.6–10.4)
GLUCOSE: 91 mg/dL (ref 65–99)
Potassium: 4.1 mmol/L (ref 3.5–5.3)
SODIUM: 130 mmol/L — AB (ref 135–146)
Total Protein: 6.7 g/dL (ref 6.1–8.1)

## 2016-05-17 LAB — TSH: TSH: 1.14 mIU/L

## 2016-05-17 NOTE — Telephone Encounter (Signed)
Pt returned your call. Lisa Lucero

## 2016-05-21 ENCOUNTER — Telehealth: Payer: Self-pay | Admitting: *Deleted

## 2016-05-21 NOTE — Telephone Encounter (Signed)
Patient advised.

## 2016-05-21 NOTE — Telephone Encounter (Signed)
Patient has an appt with GYN tomorrow morning, Dr.Holland-she noticed that she has a hemorrhoid today and wondered if that was something that a GYN could treat.

## 2016-05-21 NOTE — Telephone Encounter (Signed)
He will be doing a pelvic exam, so can look there, and likely can treat a hemorrhoid.  Looks like her gastro appt isn't until 7/20

## 2016-05-22 DIAGNOSIS — Z124 Encounter for screening for malignant neoplasm of cervix: Secondary | ICD-10-CM | POA: Diagnosis not present

## 2016-05-22 DIAGNOSIS — Z681 Body mass index (BMI) 19 or less, adult: Secondary | ICD-10-CM | POA: Diagnosis not present

## 2016-05-22 DIAGNOSIS — N760 Acute vaginitis: Secondary | ICD-10-CM | POA: Diagnosis not present

## 2016-05-23 ENCOUNTER — Other Ambulatory Visit: Payer: Self-pay | Admitting: Family Medicine

## 2016-05-24 ENCOUNTER — Telehealth: Payer: Self-pay

## 2016-05-24 ENCOUNTER — Telehealth: Payer: Self-pay | Admitting: *Deleted

## 2016-05-24 ENCOUNTER — Ambulatory Visit (INDEPENDENT_AMBULATORY_CARE_PROVIDER_SITE_OTHER): Payer: Medicare Other | Admitting: *Deleted

## 2016-05-24 DIAGNOSIS — Z5181 Encounter for therapeutic drug level monitoring: Secondary | ICD-10-CM

## 2016-05-24 DIAGNOSIS — I4891 Unspecified atrial fibrillation: Secondary | ICD-10-CM

## 2016-05-24 LAB — POCT INR: INR: 1.6

## 2016-05-24 MED ORDER — HYDROCORTISONE ACETATE 25 MG RE SUPP: RECTAL | Status: DC

## 2016-05-24 NOTE — Telephone Encounter (Signed)
Yes scheduled for July.

## 2016-05-24 NOTE — Telephone Encounter (Signed)
[facility], called needing sig for hydrocortisone suppository.   Thanks, RLB

## 2016-05-24 NOTE — Telephone Encounter (Signed)
Patient called and said that she really needs some help from you with the suppositories that have the hydrocortisone in them. The OTC ones do not help at all. Saw Dr.Holland and he gave her some triamcinolone cream-this did not help either. She really needs some comfort and these are the only things that seem to help her.

## 2016-05-24 NOTE — Telephone Encounter (Signed)
Are these meds that you usually refill for patient-hard to figure out in chart med history.

## 2016-05-24 NOTE — Telephone Encounter (Signed)
Has she scheduled with GI yet?? Needs to schedule this. Okay to refill #10 of the hydrocortisone suppositories until she can be seen

## 2016-05-27 ENCOUNTER — Other Ambulatory Visit: Payer: Self-pay | Admitting: Family Medicine

## 2016-05-28 ENCOUNTER — Other Ambulatory Visit: Payer: Self-pay | Admitting: Family Medicine

## 2016-05-29 NOTE — Telephone Encounter (Signed)
Is this ok to refill?  

## 2016-05-30 ENCOUNTER — Ambulatory Visit: Payer: Medicare Other | Admitting: Family Medicine

## 2016-06-08 ENCOUNTER — Telehealth: Payer: Self-pay

## 2016-06-08 ENCOUNTER — Other Ambulatory Visit: Payer: Self-pay | Admitting: Family Medicine

## 2016-06-08 MED ORDER — ZOLPIDEM TARTRATE ER 6.25 MG PO TBCR: 6.2500 mg | EXTENDED_RELEASE_TABLET | Freq: Every day | ORAL | Status: DC

## 2016-06-08 NOTE — Telephone Encounter (Signed)
[place] for #90, no refill

## 2016-06-08 NOTE — Telephone Encounter (Signed)
Phoned in.

## 2016-06-08 NOTE — Telephone Encounter (Signed)
[facility] faxed requesting 90 day supply of zolpidem 6.25mg  tablets. Lisa Lucero

## 2016-06-11 ENCOUNTER — Ambulatory Visit (INDEPENDENT_AMBULATORY_CARE_PROVIDER_SITE_OTHER): Payer: Medicare Other

## 2016-06-11 DIAGNOSIS — I4891 Unspecified atrial fibrillation: Secondary | ICD-10-CM | POA: Diagnosis not present

## 2016-06-11 DIAGNOSIS — Z5181 Encounter for therapeutic drug level monitoring: Secondary | ICD-10-CM

## 2016-06-11 LAB — POCT INR: INR: 2.5

## 2016-06-20 DIAGNOSIS — N760 Acute vaginitis: Secondary | ICD-10-CM | POA: Diagnosis not present

## 2016-06-25 DIAGNOSIS — Z85828 Personal history of other malignant neoplasm of skin: Secondary | ICD-10-CM | POA: Diagnosis not present

## 2016-06-25 DIAGNOSIS — L700 Acne vulgaris: Secondary | ICD-10-CM | POA: Diagnosis not present

## 2016-06-25 DIAGNOSIS — L308 Other specified dermatitis: Secondary | ICD-10-CM | POA: Diagnosis not present

## 2016-06-27 ENCOUNTER — Ambulatory Visit (INDEPENDENT_AMBULATORY_CARE_PROVIDER_SITE_OTHER): Payer: Medicare Other | Admitting: Family Medicine

## 2016-06-27 ENCOUNTER — Encounter: Payer: Self-pay | Admitting: Family Medicine

## 2016-06-27 VITALS — BP 130/80 | HR 66 | Temp 97.9°F | Ht 66.0 in | Wt 123.0 lb

## 2016-06-27 DIAGNOSIS — Z5181 Encounter for therapeutic drug level monitoring: Secondary | ICD-10-CM

## 2016-06-27 DIAGNOSIS — H8110 Benign paroxysmal vertigo, unspecified ear: Secondary | ICD-10-CM

## 2016-06-27 DIAGNOSIS — E871 Hypo-osmolality and hyponatremia: Secondary | ICD-10-CM | POA: Diagnosis not present

## 2016-06-27 LAB — BASIC METABOLIC PANEL
BUN: 25 mg/dL (ref 7–25)
CHLORIDE: 99 mmol/L (ref 98–110)
CO2: 25 mmol/L (ref 20–31)
Calcium: 9.8 mg/dL (ref 8.6–10.4)
Creat: 1.11 mg/dL — ABNORMAL HIGH (ref 0.60–0.93)
Glucose, Bld: 89 mg/dL (ref 65–99)
POTASSIUM: 4.1 mmol/L (ref 3.5–5.3)
Sodium: 137 mmol/L (ref 135–146)

## 2016-06-27 MED ORDER — MECLIZINE HCL 12.5 MG PO TABS: 12.5000 mg | ORAL_TABLET | Freq: Three times a day (TID) | ORAL | Status: DC | PRN

## 2016-06-27 NOTE — Progress Notes (Signed)
Chief Complaint  Patient presents with  . Dizziness    onset 1-2 months. reports it has gotten worse lately.    She presents with at least two months of "dizziness". Some days are worse than others. Today she feels dizzy.  Describes it as feeling lightheaded.  She doesn't know if it is "my eyes or my ears".  Denies vision problems, but yesterday her eye seems a little droopy on the right, not today.  She has some ringing in the left ear, starting recently.  Denies hearing loss or ear pain.  She states she walks "staggering", not walking as straight as she used to. Yesterday she didn't have a problem walking straight, today is having more problems with dizziness and balance. She takes a cane with her now when she goes for a walk.  She has not had any falls.   Usually she isn't as dizzy when sitting, but today she is. She doesn't feel faint at all. She feels it is more like an equilibrium problem.  Denies room spinning. When she stands, she feels unsteady, like she can't walk straight.  Symptoms for at least 2 months, likely longer, but more noticeable in the last 2 months.  It seems to be getting more frequent--having more bad days than good days.   She tries to drink a lot of water--at least 5 cups/day.  She denies any cold or allergy symptoms. Denies any headaches, double vision, numbness, tingling, weakness, bowel/bladder troubles.  She denies any bleeding, just the usual bruising. Last INR was 2.5 2 weeks ago.  Last labs were 04/2016--normal CBC, lipids, TSH.  Creatinin was 1.3, Sodium was 130  She had an MRI 03/2015: IMPRESSION: 1. No acute intracranial abnormality. 2. Chronic signal abnormality in the brain most suggestive of advanced chronic small vessel disease, with mild progression since 2011.  PMH, PSH, SH reviewed.  Outpatient Encounter Prescriptions as of 06/27/2016  Medication Sig Note  . calcium carbonate (TUMS - DOSED IN MG ELEMENTAL CALCIUM) 500 MG chewable tablet  Chew 1 tablet by mouth 2 (two) times daily.   Marland Kitchen diltiazem (CARDIZEM CD) 120 MG 24 hr capsule Take 1 capsule (120 mg total) by mouth daily. Please call and schedule a six month follow up appointment with Dr Marlou Porch 05/14/2016: Taking '120mg'$  in the evening  . diltiazem (DILACOR XR) 240 MG 24 hr capsule Take 240 mg by mouth daily. 05/14/2016: Taking '240mg'$  in the morning  . FERREX 150 150 MG capsule TAKE ONE CAPSULE BY MOUTH EVERY DAY   . gabapentin (NEURONTIN) 100 MG capsule TAKE 2 CAPSULES TWICE A DAY   . hydrochlorothiazide (HYDRODIURIL) 25 MG tablet Take 0.5 tablets (12.5 mg total) by mouth daily.   Marland Kitchen KLOR-CON M10 10 MEQ tablet TAKE 1 TABLET TWICE A DAY   . levothyroxine (SYNTHROID, LEVOTHROID) 25 MCG tablet TAKE 1 TABLET DAILY BEFORE BREAKFAST   . lisinopril (PRINIVIL,ZESTRIL) 20 MG tablet TAKE 1 TABLET TWICE A DAY   . Magnesium 400 MG CAPS Take 1 tablet by mouth daily.   . metoprolol (LOPRESSOR) 50 MG tablet TAKE 1 TABLET TWICE A DAY   . Multiple Vitamin (MULITIVITAMIN WITH MINERALS) TABS Take 1 tablet by mouth daily.   . pantoprazole (PROTONIX) 40 MG tablet TAKE 1 TABLET DAILY   . pravastatin (PRAVACHOL) 20 MG tablet TAKE 1 TABLET (20 MG TOTAL) BY MOUTH EVERY EVENING.   Marland Kitchen PREMARIN vaginal cream USE 1 GRAM VAGINALLY AT BEDTIME 2 TIMES A WEEK 06/27/2016: Received from: External Pharmacy  .  triamcinolone cream (KENALOG) 0.1 % Apply 1 application topically 2 (two) times daily.   Marland Kitchen warfarin (COUMADIN) 5 MG tablet TAKE AS DIRECTED BY COUMADIN CLINIC 06/27/2016: 1 tablet 3 days a week, and 1/2 tablet other days.   Marland Kitchen zolpidem (AMBIEN CR) 6.25 MG CR tablet Take 1 tablet (6.25 mg total) by mouth at bedtime.   . hydrocortisone (ANUSOL-HC) 25 MG suppository Reported on 05/09/2016 (Patient not taking: Reported on 06/27/2016)   . [DISCONTINUED] metroNIDAZOLE (METROGEL) 0.75 % gel Reported on 05/14/2016 05/14/2016: Uses prn to skin, not using recently (rx'd by Dr. Jarome Matin in 02/2016)   No facility-administered  encounter medications on file as of 06/27/2016.   ROS: no fever chills, allergy or URI symptoms, ear pain, sore throat. See HPI.  Denies headache, chest pain, palpitations, shortness of breath, nausea, vomiting, diarrhea, bleeding.  Rash on her back--using triamcinolone which helps.  She saw dermatologist. Started on premarin cream by GYN, hasn't noticed huge difference yet.  PHYSICAL EXAM: BP 130/80 mmHg  Pulse 66  Temp(Src) 97.9 F (36.6 C) (Oral)  Ht '5\' 6"'$  (1.676 m)  Wt 123 lb (55.792 kg)  BMI 19.86 kg/m2  Well developed, pleasant female in no distress.  Somewhat of a difficult historian. She is well-appearing.  See epic for orthostatic vital signs.  She felt dizzy when standing and BP dropped some to 130/80, per nursing notes.  Pt also reports feeling somewhat dizzy while sitting. HEENT: PERRL, EOMI, conjunctiva and sclera are clear, fundi benign. TM's and EAC's normal. Nasal mucosa is mild-mod edematous, pale, with clear mucus. Sinuses are nontender. OP is clear, moist mucus membranes Neck: no lymphadenopathy. Right-sided goiter (has U/S scheduled). No carotid bruit Heart: regular rate and rhythm, no murmur Lungs: clear bilaterally Extremities: no edema, normal pulses Neuro: alert, oriented. Normal finger to nose, strength, DTR's symmetric.  A few beats of nystagmus bilaterally when looking right and left (following finer).No symptoms of vertigo/dizziness at the time Gait appeared normal, but slow. With head to the right, after sitting up only, she felt very unsteady, "head doesn't feel right"--not lightheaded, no vertigo. Didn't improve after 2 minutes of keeping head turned to the right. When she put her head straight forward, no change. When she laid back down (head straight forwards) she immediately felt better Sitting up, she felt better when she turned her head to the left. Laying with head to the left, she felt fine, and had no problems when she sat back up, with head turned  to the left.  ASSESSMENT/PLAN:  Benign paroxysmal positional vertigo, unspecified laterality - Plan: meclizine (ANTIVERT) 12.5 MG tablet, Ambulatory Referral to Neuro Rehab  Hyponatremia - Plan: Basic metabolic panel  Medication monitoring encounter - Plan: Basic metabolic panel   BPV May have some component of mild allergies, but not likely contributing to the positional symptoms.  We are referring you to physical therapy to help fix the problem causing your dizziness. In the meantime, you may use meclizine 12.'5mg'$  every 8 hours as needed for this dizziness.   Use with caution--it may make you a little sleepy, so be careful if you are driving.  If your insurance doesn't cover the meclizine, ask the pharmacist--it is available without a prescription (bonine is one of the brands).    b-met today (hyponatremia, elevated Cr).

## 2016-06-27 NOTE — Patient Instructions (Signed)
We are referring you to physical therapy to help fix the problem causing your dizziness. In the meantime, you may use meclizine 12.5mg  every 8 hours as needed for this dizziness.   Use with caution--it may make you a little sleepy, so be careful if you are driving.  If your insurance doesn't cover the meclizine, ask the pharmacist--it is available without a prescription (bonine is one of the brands).

## 2016-07-04 ENCOUNTER — Ambulatory Visit (INDEPENDENT_AMBULATORY_CARE_PROVIDER_SITE_OTHER): Payer: Medicare Other | Admitting: *Deleted

## 2016-07-04 DIAGNOSIS — I4891 Unspecified atrial fibrillation: Secondary | ICD-10-CM | POA: Diagnosis not present

## 2016-07-04 DIAGNOSIS — Z5181 Encounter for therapeutic drug level monitoring: Secondary | ICD-10-CM | POA: Diagnosis not present

## 2016-07-04 LAB — POCT INR: INR: 2.2

## 2016-07-09 ENCOUNTER — Other Ambulatory Visit: Payer: Self-pay | Admitting: Family Medicine

## 2016-07-13 ENCOUNTER — Ambulatory Visit: Payer: Medicare Other | Attending: Family Medicine

## 2016-07-13 DIAGNOSIS — R42 Dizziness and giddiness: Secondary | ICD-10-CM | POA: Diagnosis not present

## 2016-07-13 DIAGNOSIS — R2689 Other abnormalities of gait and mobility: Secondary | ICD-10-CM | POA: Insufficient documentation

## 2016-07-13 DIAGNOSIS — H8113 Benign paroxysmal vertigo, bilateral: Secondary | ICD-10-CM | POA: Diagnosis not present

## 2016-07-13 NOTE — Therapy (Signed)
[facility] [address] [place], [place], [address] Phone: [phone]   Fax:  [phone]  Physical Therapy Evaluation  Patient Details  Name: Lisa Lucero MRN: [id number] Date of Birth: April 19, 1935 Referring Provider: Dr. Tomi Bamberger  Encounter Date: 07/13/2016      PT End of Session - 07/13/16 1123    Visit Number 1   Number of Visits 9   Date for PT Re-Evaluation 08/12/16   Authorization Type G-CODE AND PROGRESS NOTE EVERY 10TH VISIT. Medicare primary and [employer] secondary.   PT Start Time 1017   PT Stop Time 1107   PT Time Calculation (min) 50 min   Equipment Utilized During Treatment Gait belt   Activity Tolerance Patient tolerated treatment well   Behavior During Therapy WFL for tasks assessed/performed      Past Medical History  Diagnosis Date  . Paroxysmal atrial fibrillation (HCC)   . History of embolic stroke [id number]  . Erosive gastritis     with GI bleed thought due to elevated INR and duodenitis  . Hyperthyroidism   . Peripheral edema   . Long-term (current) use of anticoagulants   . HTN (hypertension)   . Stroke ([place])   . GERD (gastroesophageal reflux disease)   . Back pain   . COPD (chronic obstructive pulmonary disease) ([place])     pt denies on 09/27/14  . H/O hypercholesterolemia     PMH: only  . Arthritis   . Anemia     Iron deficiency anemia  . Hypothyroidism   . Insomnia     prev tried Trazadone (2014); ambien CR caused hangover; on ambien since 11/2013  . Osteoporosis   . Hypothyroidism   . H/O: GI bleed   . Osteoarthritis of both knees   . Primary osteoarthritis of both hips   . Osteoarthritis of lumbar spine   . H/O transfusion of packed red blood cells   . External hemorrhoids without complication   . Pyuria   . Postmenopausal   . Arthropathy   . Blood in stool   . Iron deficiency anemia due to chronic blood loss   . Hemorrhage of rectum and anus   . Chronic kidney disease   .  CVA (cerebral vascular accident) [facility])     Past Surgical History  Procedure Laterality Date  . Hemorrhoid surgery    . Operative hysteroscopy    . Abdominal hysterectomy    . Cholecystectomy  03/11/2012    Procedure: LAPAROSCOPIC CHOLECYSTECTOMY WITH INTRAOPERATIVE CHOLANGIOGRAM;  Surgeon: Joyice Faster. Cornett, MD;  Location: [facility];  Service: General;  Laterality: N/A;  . Breast enhancement surgery    . Colonoscopy    . Wisdom tooth extraction    . Lumbar laminectomy N/A 09/29/2014    Procedure: L4-5 Decompression, Hemi-Laminectomy,  Removal Free Fragment;  Surgeon: Marybelle Killings, MD;  Location: [facility];  Service: Orthopedics;  Laterality: N/A;  . Orif patella Left 10/25/2014    Procedure: OPEN REDUCTION INTERNAL (ORIF) FIXATION LEFT PATELLA;  Surgeon: Marybelle Killings, MD;  Location: [facility];  Service: Orthopedics;  Laterality: Left;  . Cholecystectomy    . Cyst excision  11/2014    left chest wall--nodular hidroadenoma  . Tee without cardioversion N/A 12/09/2015    Procedure: TRANSESOPHAGEAL ECHOCARDIOGRAM (TEE);  Surgeon: Jerline Pain, MD;  Location: [facility];  Service: Cardiovascular;  Laterality: N/A;  . Cardioversion N/A 12/09/2015    Procedure: CARDIOVERSION;  Surgeon: Jerline Pain, MD;  Location: [facility];  Service: Cardiovascular;  Laterality: N/A;    There were no vitals filed for this visit.       Subjective Assessment - 07/13/16 1025    Subjective Pt reported dizziness began approx. 3 months ago, pt unaware of injury that would have provoked dizziness. Pt reported she staggers a lot, even when walking through the house and sometimes uses a cane for longer distances and over grass. Pt denied falls but reported she needs something to steady herself for balance. Pt describes the dizziness as "something going on in the head, I feel funny in the head".  Pt denies spinning sensation. Pt reported at end of session that she feels L ear is affected side. Pt reported 4-5/10 dizziness  after FGA but unable to state a rating for dizziness when at worst.   Pertinent History A-fib, HTN, hypo and hyperthyroidism related to thyroid nodules, Hx of CVA, osteoporosis, Hx of Lx laminectomy, Hx of L patella fx, chronic kidney disease, COPD-per chart but pt denied having COPD, aortic atherosclerosis   Patient Stated Goals "I would like [place] to find out what the problem is and correct it". Improve balance when walking.    Currently in Pain? No/denies            [facility] PT Assessment - 07/13/16 1029    Assessment   Medical Diagnosis BPPV   Referring Provider Dr. Tomi Bamberger   Onset Date/Surgical Date 04/13/16   Prior Therapy none for dizziness   Precautions   Precautions Fall   Restrictions   Weight Bearing Restrictions No   Balance Screen   Has the patient fallen in the past 6 months No   Has the patient had a decrease in activity level because of a fear of falling?  Yes   Is the patient reluctant to leave their home because of a fear of falling?  No   Home Social worker Private residence   Living Arrangements Spouse/significant other   Available Help at Discharge Family   Type of [place] to enter   Entrance Stairs-Number of Steps 1   Entrance Stairs-Rails None   Home Layout Two level;Able to live on main level with bedroom/bathroom   Alternate Level Stairs-Number of Steps 12   Alternate Level Stairs-Rails Left   [place] [facility] - single point   Prior Function   Level of Independence Independent   Vocation Retired   Leisure Crossword puzzles, walking, church activities   Cognition   Overall Cognitive Status Impaired/Different from baseline   Memory Appears intact  but pt reports memory issues   Posture/Postural Control   Posture/Postural Control Postural limitations   Postural Limitations Forward head  Trunk shifted to R side, R LE shorter than L LE   Ambulation/Gait   Ambulation/Gait Yes   Ambulation/Gait Assistance 5:  Supervision   Ambulation/Gait Assistance Details Pt amb. in guarded manner and trunk shifted to R side, this could be from RLE shorter than LLE per pt report.   Ambulation Distance (Feet) 75 Feet   Assistive device None   Gait Pattern Step-through pattern;Decreased stride length;Decreased arm swing - right;Decreased arm swing - left  guarded manner   Ambulation Surface Level;Indoor   Gait velocity 2.39ft/sec.   Functional Gait  Assessment   Gait assessed  Yes   Gait Level Surface Walks 20 ft in less than 7 sec but greater than 5.5 sec, uses assistive device, slower speed, mild gait deviations, or deviates 6-10 in outside  of the 12 in walkway width.  6.0sec.   Change in Gait Speed Makes only minor adjustments to walking speed, or accomplishes a change in speed with significant gait deviations, deviates 10-15 in outside the 12 in walkway width, or changes speed but loses balance but is able to recover and continue walking.   Gait with Horizontal Head Turns Performs head turns with moderate changes in gait velocity, slows down, deviates 10-15 in outside 12 in walkway width but recovers, can continue to walk.   Gait with Vertical Head Turns Performs task with moderate change in gait velocity, slows down, deviates 10-15 in outside 12 in walkway width but recovers, can continue to walk.   Gait and Pivot Turn Pivot turns safely within 3 sec and stops quickly with no loss of balance.   Step Over Obstacle Is able to step over 2 stacked shoe boxes taped together (9 in total height) without changing gait speed. No evidence of imbalance.   Gait with Narrow Base of Support Ambulates less than 4 steps heel to toe or cannot perform without assistance.   Gait with Eyes Closed Walks 20 ft, slow speed, abnormal gait pattern, evidence for imbalance, deviates 10-15 in outside 12 in walkway width. Requires more than 9 sec to ambulate 20 ft.   Ambulating Backwards Walks 20 ft, uses assistive device, slower speed, mild  gait deviations, deviates 6-10 in outside 12 in walkway width.   Steps Alternating feet, must use rail.  no rail to ascend but rail to descend   Total Score 16            Vestibular Assessment - 07/13/16 1038    Vestibular Assessment   General Observation Pt amb. in guarded manner and reported that turns can also provoke dizziness/unsteadiness.   Symptom Behavior   Type of Dizziness "Funny feeling in head"   Frequency of Dizziness Every day   Duration of Dizziness Occurs when pt gets up and walk, but unsure how long it lasts   Aggravating Factors Sit to stand;Turning head quickly  standing up and walking   Relieving Factors Rest  sitting down   Occulomotor Exam   Occulomotor Alignment Normal   Spontaneous Absent   Gaze-induced Absent   Smooth Pursuits Saccades   Saccades Intact   Comment Pt denied dizziness during exam, 1 corrective saccade noted during L-sided smooth pursuits. Pt experience difficulty following commands during head thrust test, as she would take eyes off target.   Vestibulo-Occular Reflex   VOR 1 Head Only (x 1 viewing) WNL   Positional Testing   Dix-Hallpike Dix-Hallpike Right;Dix-Hallpike Left   Horizontal Canal Testing Horizontal Canal Right;Horizontal Canal Left   Dix-Hallpike Right   Dix-Hallpike Right Duration none   Dix-Hallpike Right Symptoms No nystagmus   Dix-Hallpike Left   Dix-Hallpike Left Duration none   Dix-Hallpike Left Symptoms No nystagmus   Horizontal Canal Right   Horizontal Canal Right Duration none   Horizontal Canal Right Symptoms Normal   Horizontal Canal Left   Horizontal Canal Left Duration none   Horizontal Canal Left Symptoms Normal                       PT Education - 07/13/16 1122    Education provided Yes   Education Details PT discussed outcome measure results and findings during vestibular exam. PT discussed frequency/duration.   Person(s) Educated Patient   Methods Explanation   Comprehension  Verbalized understanding  PT Short Term Goals - 07/13/16 1133    PT SHORT TERM GOAL #1   Title same as LTGs            PT Long Term Goals - 07/13/16 1133    PT LONG TERM GOAL #1   Title Pt will be IND in HEP to improve balance and dizziness in order to perform functional activities safely. Target date: 08/10/16   Status New   PT LONG TERM GOAL #2   Title Pt will improve FGA score by >/=25/30 to decr. falls risk. Target date: 08/10/16   Status New   PT LONG TERM GOAL #3   Title Pt will amb. 1000' over even/uneven terrain, while performing head turns, IND with reports of dizziness not incr. by >/= 2points to improve functional mobility. Target date; 08/10/16   Status New   PT LONG TERM GOAL #4   Title Pt will report dizziness does not surpass 2/10 during gait to improve safety during mobility. Target date: 08/10/16   Status New   PT LONG TERM GOAL #5   Title Pt will perform 360 degree turns with no incr. in dizziness and no LOB in order to improve functional mobility. Target date: 08/10/16   Status New   Additional Long Term Goals   Additional Long Term Goals Yes   PT LONG TERM GOAL #6   Title Have pt complete DHI and write goal. Target date: 08/10/16   Status New               Plan - 07/13/16 1127    Clinical Impression Statement Pt is a pleasant 81y/o female presenting to OPPT neuro for dizziness. Pt's PMH significant for: A-fib, HTN, hypo and hyperthyroidism related to thyroid nodules, Hx of CVA, osteoporosis, Hx of Lx laminectomy, Hx of L patella fx, chronic kidney disease, COPD-per chart but pt denied having COPD, aortic atherosclerosis. During exam position testing negative for vertigo. Pt's exam revealed the following deficits: gait deviations, impaired balance, dizziness/unsteadiness, and postural dysfunction. Pt's FGA score indicates pt is at high risk for falls. Pt's gait speed WNL. PT will perform SOT next session, in order to determine pt's ability to use  the correct system to maintain balance during different conditions.    Rehab Potential Good   Clinical Impairments Affecting Rehab Potential co-morbidities   PT Frequency 2x / week   PT Duration 4 weeks   PT Treatment/Interventions ADLs/Self Care Home Management;Biofeedback;Canalith Repostioning;Orthotic Fit/Training;Patient/family education;Neuromuscular re-education;Balance training;Therapeutic exercise;Therapeutic activities;Functional mobility training;Manual techniques;Vestibular;Stair training;Gait training;DME Instruction   PT Next Visit Plan Perform SOT and write goal if appropriate, provide pt balance exercises to improve vestibular input (gait with head turns and eyes closed)   Consulted and Agree with Plan of Care Patient      Patient will benefit from skilled therapeutic intervention in order to improve the following deficits and impairments:  Abnormal gait, Decreased knowledge of use of DME, Dizziness, Decreased mobility, Decreased balance, Postural dysfunction  Visit Diagnosis: Dizziness and giddiness - Plan: PT plan of care cert/re-cert  BPPV (benign paroxysmal positional vertigo), bilateral - Plan: PT plan of care cert/re-cert  Other abnormalities of gait and mobility - Plan: PT plan of care cert/re-cert      G-Codes - 0000000 1137    Functional Assessment Tool Used FGA: 16/30   Functional Limitation Mobility: Walking and moving around   Mobility: Walking and Moving Around Current Status [id number]) At least 40 percent but less than 60 percent impaired, limited or restricted  Mobility: Walking and Moving Around Goal Status [phone]) At least 1 percent but less than 20 percent impaired, limited or restricted       Problem List Patient Active Problem List   Diagnosis Date Noted  . Hypothyroidism 12/18/2015  . Multiple thyroid nodules 12/18/2015  . Essential hypertension, benign 11/15/2015  . Chronic leg pain 06/09/2015  . Aortic atherosclerosis ([place]) 01/13/2015  .  Left patella fracture 10/25/2014  . Encounter for therapeutic drug monitoring 02/19/2014  . Gallstone 03/08/2012  . Hemorrhagic cystitis 03/08/2012  . Leukocytosis 03/08/2012  . Hyponatremia 03/08/2012  . Hypochloremia 03/08/2012  . Hypokalemia 03/08/2012  . Benign hypertensive heart disease without heart failure 05/17/2011  . Dyslipidemia 05/17/2011  . History of embolic stroke   . Hyperthyroidism   . Long-term (current) use of anticoagulants   . Atrial fibrillation ([place]) 04/23/2011    Miller,Jennifer L 07/13/2016, 11:39 AM  [facility] [address] [place], [place], [address] Phone: [phone]   Fax:  [phone]  Name: Lisa Lucero MRN: [id number] Date of Birth: 1935/10/17   Geoffry Paradise, PT,DPT 07/13/2016 11:39 AM Phone: [phone] Fax: [phone]

## 2016-07-16 ENCOUNTER — Ambulatory Visit: Payer: Medicare Other | Admitting: Physical Therapy

## 2016-07-16 DIAGNOSIS — Z1231 Encounter for screening mammogram for malignant neoplasm of breast: Secondary | ICD-10-CM | POA: Diagnosis not present

## 2016-07-18 ENCOUNTER — Ambulatory Visit: Payer: Medicare Other | Admitting: Physical Therapy

## 2016-07-18 DIAGNOSIS — R42 Dizziness and giddiness: Secondary | ICD-10-CM

## 2016-07-18 DIAGNOSIS — R2689 Other abnormalities of gait and mobility: Secondary | ICD-10-CM

## 2016-07-18 DIAGNOSIS — H8113 Benign paroxysmal vertigo, bilateral: Secondary | ICD-10-CM | POA: Diagnosis not present

## 2016-07-18 NOTE — Patient Instructions (Signed)
Balance: Eyes Closed - Bilateral (Varied Surfaces)    Stand, feet shoulder width, close eyes. Maintain balance __30-60__ seconds. Repeat __1-2 /day_ times per set. Do __1__ sets per session. Do __5__ sessions per week. Repeat on compliant surface: foam.  Also stand with eyes open - look at targets on either side - left to right- and up/down ;  8-10 times each way  Copyright  VHI. All rights reserved.  Also practice walking in home with head turns side to side and up/down - with cane as needed

## 2016-07-19 ENCOUNTER — Ambulatory Visit
Admission: RE | Admit: 2016-07-19 | Discharge: 2016-07-19 | Disposition: A | Discharge status: Home / Self Care | Payer: Medicare Other | Source: Ambulatory Visit | Attending: Family Medicine | Admitting: Family Medicine

## 2016-07-19 ENCOUNTER — Other Ambulatory Visit: Payer: Medicare Other

## 2016-07-19 ENCOUNTER — Ambulatory Visit: Payer: Medicare Other | Admitting: Gastroenterology

## 2016-07-19 DIAGNOSIS — E041 Nontoxic single thyroid nodule: Secondary | ICD-10-CM | POA: Diagnosis not present

## 2016-07-19 NOTE — Therapy (Signed)
[facility] [address] [place], [place], [address] Phone: [phone]   Fax:  [phone]  Physical Therapy Treatment  Patient Details  Name: Lisa Lucero MRN: [id number] Date of Birth: 1935/05/27 Referring Provider: Dr. Tomi Bamberger  Encounter Date: 07/18/2016      PT End of Session - 07/19/16 [id number]    Visit Number 2   Number of Visits 9   Date for PT Re-Evaluation 08/12/16   Authorization Type G-CODE AND PROGRESS NOTE EVERY 10TH VISIT. Medicare primary and [employer] secondary.   PT Start Time 1105   PT Stop Time 1150   PT Time Calculation (min) 45 min      Past Medical History  Diagnosis Date  . Paroxysmal atrial fibrillation (HCC)   . History of embolic stroke Q000111Q  . Erosive gastritis     with GI bleed thought due to elevated INR and duodenitis  . Hyperthyroidism   . Peripheral edema   . Long-term (current) use of anticoagulants   . HTN (hypertension)   . Stroke ([place])   . GERD (gastroesophageal reflux disease)   . Back pain   . COPD (chronic obstructive pulmonary disease) ([place])     pt denies on 09/27/14  . H/O hypercholesterolemia     PMH: only  . Arthritis   . Anemia     Iron deficiency anemia  . Hypothyroidism   . Insomnia     prev tried Trazadone (2014); ambien CR caused hangover; on ambien since 11/2013  . Osteoporosis   . Hypothyroidism   . H/O: GI bleed   . Osteoarthritis of both knees   . Primary osteoarthritis of both hips   . Osteoarthritis of lumbar spine   . H/O transfusion of packed red blood cells   . External hemorrhoids without complication   . Pyuria   . Postmenopausal   . Arthropathy   . Blood in stool   . Iron deficiency anemia due to chronic blood loss   . Hemorrhage of rectum and anus   . Chronic kidney disease   . CVA (cerebral vascular accident) [facility])     Past Surgical History  Procedure Laterality Date  . Hemorrhoid surgery    . Operative hysteroscopy    . Abdominal  hysterectomy    . Cholecystectomy  03/11/2012    Procedure: LAPAROSCOPIC CHOLECYSTECTOMY WITH INTRAOPERATIVE CHOLANGIOGRAM;  Surgeon: Joyice Faster. Cornett, MD;  Location: [facility];  Service: General;  Laterality: N/A;  . Breast enhancement surgery    . Colonoscopy    . Wisdom tooth extraction    . Lumbar laminectomy N/A 09/29/2014    Procedure: L4-5 Decompression, Hemi-Laminectomy,  Removal Free Fragment;  Surgeon: Marybelle Killings, MD;  Location: [facility];  Service: Orthopedics;  Laterality: N/A;  . Orif patella Left 10/25/2014    Procedure: OPEN REDUCTION INTERNAL (ORIF) FIXATION LEFT PATELLA;  Surgeon: Marybelle Killings, MD;  Location: [employer];  Service: Orthopedics;  Laterality: Left;  . Cholecystectomy    . Cyst excision  11/2014    left chest wall--nodular hidroadenoma  . Tee without cardioversion N/A 12/09/2015    Procedure: TRANSESOPHAGEAL ECHOCARDIOGRAM (TEE);  Surgeon: Jerline Pain, MD;  Location: [facility];  Service: Cardiovascular;  Laterality: N/A;  . Cardioversion N/A 12/09/2015    Procedure: CARDIOVERSION;  Surgeon: Jerline Pain, MD;  [facility];  Service: Cardiovascular;  Laterality: N/A;    There were no vitals filed for this visit.      Subjective Assessment -  07/19/16 0832    Subjective Pt reports she is very dizzy this morning, more so than at time of eval last week; states she was dizzy when she woke up today; pt using cane for assistance with ambulation due to increased dizziness; pt states she feels that her left ear "needs to pop" - states it feels full/pressure                                                                                                                         Pertinent History A-fib, HTN, hypo and hyperthyroidism related to thyroid nodules, Hx of CVA, osteoporosis, Hx of Lx laminectomy, Hx of L patella fx, chronic kidney disease, COPD-per chart but pt denied having COPD, aortic atherosclerosis   Patient Stated Goals "I would like [place] to find out what the  problem is and correct it". Improve balance when walking.    Currently in Pain? No/denies       R and L Dix-Hallpike tests (-) with no nystagmus noted in either position:  No c/o vertigo in test positions  Sensory Organization Test:  Condition 1;  All 3 trials WNL's                                                Condition 2:  All 3 trials WNL's                                                Condition 3:  Trials 1 and 2 below N:  Trial 3 WNL's                                                Condition 4:  Trial 1 and 3 below N with trial 2 WNL's                                                Condition 5:  Trials 1 and 2 below N with FALL on trial 3                                                Condition 6:  FALL on all 3 trials  Instructed pt in balance on foam exercises - in corner - feet apart -- EYES OPEN and CLOSED ; targets  Side to side and  up/down with EO and head turns with EC (gave these 2 positions for HEP)   Did not give standing with feet together due to unsteadiness on foam - plan to gradually progress to this position    Pt did report slight increase in dizziness at end of session compared to intensity of vertigo at beginning of PT session                  Balance Exercises - 07/19/16 0836    Balance Exercises: Standing   Standing Eyes Opened Wide (BOA);Head turns;Foam/compliant surface;30 secs   Standing Eyes Closed Wide (BOA);Head turns;Foam/compliant surface;30 secs           PT Education - 07/19/16 0837    Education provided Yes   Education Details standing on pillow - Eyes closed/open with targets/head turns; also amb. in home with head turns (with cane prn)   Person(s) Educated Patient   Methods Explanation;Demonstration;Handout   Comprehension Verbalized understanding;Returned demonstration          PT Short Term Goals - 07/13/16 1133    PT SHORT TERM GOAL #1   Title same as LTGs            PT Long Term Goals - 07/13/16 1133    PT  LONG TERM GOAL #1   Title Pt will be IND in HEP to improve balance and dizziness in order to perform functional activities safely. Target date: 08/10/16   Status New   PT LONG TERM GOAL #2   Title Pt will improve FGA score by >/=25/30 to decr. falls risk. Target date: 08/10/16   Status New   PT LONG TERM GOAL #3   Title Pt will amb. 1000' over even/uneven terrain, while performing head turns, IND with reports of dizziness not incr. by >/= 2points to improve functional mobility. Target date; 08/10/16   Status New   PT LONG TERM GOAL #4   Title Pt will report dizziness does not surpass 2/10 during gait to improve safety during mobility. Target date: 08/10/16   Status New   PT LONG TERM GOAL #5   Title Pt will perform 360 degree turns with no incr. in dizziness and no LOB in order to improve functional mobility. Target date: 08/10/16   Status New   Additional Long Term Goals   Additional Long Term Goals Yes   PT LONG TERM GOAL #6   Title Have pt complete DHI and write goal. Target date: 08/10/16   Status New               Plan - 07/19/16 1204    Clinical Impression Statement Pt has decr. vestibular input (approx. 28/100 with N=50/100) and minimally decr. visual input, possbily due to vestibular hypofunction:  no signs or symptoms consistent with BPPV at this time   Rehab Potential Good   Clinical Impairments Affecting Rehab Potential co-morbidities   PT Frequency 2x / week   PT Duration 4 weeks   PT Treatment/Interventions ADLs/Self Care Home Management;Biofeedback;Canalith Repostioning;Orthotic Fit/Training;Patient/family education;Neuromuscular re-education;Balance training;Therapeutic exercise;Therapeutic activities;Functional mobility training;Manual techniques;Vestibular;Stair training;Gait training;DME Instruction   PT Next Visit Plan check balance on foam exercises and amb. with head turns   PT Home Exercise Plan balance on foam and amb. with head turns   Consulted and Agree with  Plan of Care Patient      Patient will benefit from skilled therapeutic intervention in order to improve the following deficits and impairments:  Abnormal gait, Decreased knowledge of use of DME,  Dizziness, Decreased mobility, Decreased balance, Postural dysfunction  Visit Diagnosis: Dizziness and giddiness  Other abnormalities of gait and mobility     Problem List Patient Active Problem List   Diagnosis Date Noted  . Hypothyroidism 12/18/2015  . Multiple thyroid nodules 12/18/2015  . Essential hypertension, benign 11/15/2015  . Chronic leg pain 06/09/2015  . Aortic atherosclerosis ([place]) 01/13/2015  . Left patella fracture 10/25/2014  . Encounter for therapeutic drug monitoring 02/19/2014  . Gallstone 03/08/2012  . Hemorrhagic cystitis 03/08/2012  . Leukocytosis 03/08/2012  . Hyponatremia 03/08/2012  . Hypochloremia 03/08/2012  . Hypokalemia 03/08/2012  . Benign hypertensive heart disease without heart failure 05/17/2011  . Dyslipidemia 05/17/2011  . History of embolic stroke   . Hyperthyroidism   . Long-term (current) use of anticoagulants   . Atrial fibrillation ([place]) 04/23/2011    Romaine Neville, [place], PT 07/19/2016, 12:11 PM  [facility] [address] [place], [place], [address] Phone: [phone]   Fax:  [phone]  Name: MAKAYLYNN HEMRIC MRN: [id number] Date of Birth: [birth year]

## 2016-07-20 ENCOUNTER — Other Ambulatory Visit: Payer: Self-pay

## 2016-07-20 ENCOUNTER — Telehealth: Payer: Self-pay | Admitting: Cardiology

## 2016-07-20 ENCOUNTER — Ambulatory Visit: Payer: Medicare Other

## 2016-07-20 ENCOUNTER — Telehealth: Payer: Self-pay

## 2016-07-20 DIAGNOSIS — E042 Nontoxic multinodular goiter: Secondary | ICD-10-CM

## 2016-07-20 NOTE — Telephone Encounter (Signed)
Ok to hold Coumadin x4 days but pt will need Lovenox bridging. History of stroke, HTN, age > 50, on Coumadin for afib. Has been bridged previously multiple times. Will coordinate in Coumadin clinic. Clearance faxed to [place].

## 2016-07-20 NOTE — Telephone Encounter (Signed)
Message left with Dr. Sherryl Barters office for letter that pt may hold Coumadin for 4 days prior to thyroid biopsy. Lady who answered phone was unsure who would address this as Dr. Smitty Knudsen has retired and pt has not scheduled f/u appt with Dr. Marlou Porch. Will f/u on Mon afternoon if letter not rcvd. Victorino December

## 2016-07-20 NOTE — Telephone Encounter (Signed)
New message      Request for surgical clearance:  What type of surgery is being performed?  Thyroid biopsy 1. When is this surgery scheduled?  Not scheduled  2. Are there any medications that need to be held prior to surgery and how long? Hold coumadin 4 days prior  3. Name of physician performing surgery?  [facility]  4. What is your office phone and fax number?  Fax [phone] 5. Not sure who pt is assigned to since Dr Mare Ferrari retired

## 2016-07-20 NOTE — Telephone Encounter (Signed)
To [facility]

## 2016-07-20 NOTE — Telephone Encounter (Signed)
Received call from [facility] - procedure date set for 8/2 and pt needs INR checked that AM. Will keep that [facility] appt and also bring pt in next week for Lovenox bridge.

## 2016-07-20 NOTE — Telephone Encounter (Signed)
Called to schedule the thyroid biopsy- Since pt is on Coumadin we need to provide [facility] documentation that pt may hold Coumadin for 4 days. This needs to be faxed to [phone] to [facility] attention. They will schedule this on a Wed or Thursday because pt will need to stop by the office the morning of the procedure to have an INR checked so she can take the results with her to procedure.   Can I please write a letter that pt may hold Coumadin for 4 days and fax to [facility]? If so they will then call pt to schedule procedure. Thank you,  Victorino December

## 2016-07-20 NOTE — Telephone Encounter (Signed)
Rcvd documentation from [facility] that pt can hold Coumadin for 4 days, but will need to be bridged with Lovenox. Megan with [facility] notified of pt's bx appt on 08/01/2016 and will arrange other appts needed for Lovenox bridge and INR check on 08/01/2016 before procedure. Jinny Blossom is aware that pt needs to take copy of INR to [facility] with her to appt. Pt also needs to hold Lovenox for 1 day prior to procedure Per Baker Janus at GI. Jinny Blossom states pt will get last Lovenolx inj 24 hours prior to inj.   Pt is aware her appt is 08/01/2016 @ 12:45 arrival at [facility] on [address]. She was given the phone number is was aware Jinny Blossom will be contacting her with Coumadin instructions. Pt verbalized understanding of this, but is reluctant to do Lovenox bridge. Victorino December

## 2016-07-20 NOTE — Telephone Encounter (Signed)
Please contact Dr. Sherryl Barters office.  He is her cardiologist who manages her coumadin.  She takes for atrial fibrillation

## 2016-07-23 ENCOUNTER — Telehealth: Payer: Self-pay | Admitting: Family Medicine

## 2016-07-23 DIAGNOSIS — I10 Essential (primary) hypertension: Secondary | ICD-10-CM

## 2016-07-23 DIAGNOSIS — R600 Localized edema: Secondary | ICD-10-CM

## 2016-07-23 MED ORDER — HYDROCHLOROTHIAZIDE 25 MG PO TABS: 12.5000 mg | ORAL_TABLET | Freq: Every day | ORAL | 1 refills | Status: DC

## 2016-07-23 NOTE — Telephone Encounter (Signed)
[facility] faxed reill request for new Rx for HCTZ 25 #90

## 2016-07-23 NOTE — Telephone Encounter (Signed)
Ok to refill x 6 mos °

## 2016-07-25 ENCOUNTER — Telehealth: Payer: Self-pay | Admitting: Family Medicine

## 2016-07-25 ENCOUNTER — Ambulatory Visit (INDEPENDENT_AMBULATORY_CARE_PROVIDER_SITE_OTHER): Payer: Medicare Other | Admitting: *Deleted

## 2016-07-25 DIAGNOSIS — Z5181 Encounter for therapeutic drug level monitoring: Secondary | ICD-10-CM

## 2016-07-25 DIAGNOSIS — I4891 Unspecified atrial fibrillation: Secondary | ICD-10-CM | POA: Diagnosis not present

## 2016-07-25 LAB — POCT INR: INR: 2

## 2016-07-25 MED ORDER — ENOXAPARIN SODIUM 80 MG/0.8ML ~~LOC~~ SOLN: 80.0000 mg | SUBCUTANEOUS | 1 refills | Status: DC

## 2016-07-25 NOTE — Telephone Encounter (Signed)
It looks like her coumadin is being stopped, and being put on lovenox.  I'm assuming that this is being for the thyroid biopsy (which is the "growth" she is likely referring to, the thyroid nodule that has increased in size).  I don't see the date of the scheduled procedure, but that is my guess as to why the switch is being made, which should be temporary, related to this procedure/biopsy.

## 2016-07-25 NOTE — Telephone Encounter (Signed)
Spoke with patient and went over everything with her. She verbalized understanding.

## 2016-07-25 NOTE — Telephone Encounter (Signed)
Pt wants to discuss the reasoning why it is being requested that she get injections in her belly. She said this was discussed after she went to coumadin clinic today & it was said that the growth that she had imaged has increased. Pt wants to know if Dr Tomi Bamberger is aware of all of this.

## 2016-07-25 NOTE — Patient Instructions (Addendum)
07/27/16- Last dose of Coumadin  07/28/16-  No coumadin or Lovenox   07/29/16- Inject Lovenox 80mg  in the fatty abdominal tissue at least 2 inches from the belly button at 8am,  rotate sites. No Coumadin  07/30/16- Inject Lovenox 80mg  in the fatty abdominal tissue at least 2 inches from the belly button at 8am,  rotate sites. No Coumadin  07/31/16- Inject Lovenox 80mg  in the fatty abdominal tissue at least 2 inches from the belly button at 8am,  rotate sites. No Coumadin .   08/01/16-  Day of Biopsy- No Lovenox injection.  Coumadin Clinic Appt at 12 noon & Biopsy at1240pm. Restart Coumadin this night after Biopsy, take normal dose on 1/2 tablet.    08/02/16- Restart Lovenox injection 80mg  in the fatty abdominal tissue at least 2 inches from the belly button at 8am,  rotate sites.  Take Normal Coumadin dose of 1 tablet.  08/03/16- Inject Lovenox 80mg  in the fatty abdominal tissue at least 2 inches from the belly button at 8am,  rotate sites. Take Normal Coumadin dosage.   08/04/16-  Inject Lovenox 80mg  in the fatty abdominal tissue at least 2 inches from the belly button at 8am,  rotate sites. Take Normal Coumadin dosage.   08/05/16- Inject Lovenox 80mg  in the fatty abdominal tissue at least 2 inches from the belly button at 8am,  rotate sites. Take Normal Coumadin dosage.   08/06/16-  Inject Lovenox 80mg  in the fatty abdominal tissue at least 2 inches from the belly button at 8am,  rotate sites. Coumadin appt at 1115am

## 2016-07-26 ENCOUNTER — Ambulatory Visit: Payer: Medicare Other

## 2016-07-26 DIAGNOSIS — R2689 Other abnormalities of gait and mobility: Secondary | ICD-10-CM | POA: Diagnosis not present

## 2016-07-26 DIAGNOSIS — H8113 Benign paroxysmal vertigo, bilateral: Secondary | ICD-10-CM | POA: Diagnosis not present

## 2016-07-26 DIAGNOSIS — R42 Dizziness and giddiness: Secondary | ICD-10-CM | POA: Diagnosis not present

## 2016-07-26 NOTE — Therapy (Signed)
[facility] [address] [place], [place], [address] Phone: [phone]   Fax:  [phone]  Physical Therapy Treatment  Patient Details  Name: Lisa Lucero MRN: [id number] Date of Birth: February 19, 1935 Referring Provider: Dr. Tomi Bamberger  Encounter Date: 07/26/2016      PT End of Session - 07/26/16 1149    Visit Number 3   Number of Visits 9   Date for PT Re-Evaluation 08/12/16   Authorization Type G-CODE AND PROGRESS NOTE EVERY 10TH VISIT. Medicare primary and [employer] secondary.   PT Start Time 1100   PT Stop Time 1139   PT Time Calculation (min) 39 min   Equipment Utilized During Treatment Gait belt   Activity Tolerance Patient tolerated treatment well   Behavior During Therapy WFL for tasks assessed/performed      Past Medical History:  Diagnosis Date  . Anemia    Iron deficiency anemia  . Arthritis   . Arthropathy   . Back pain   . Blood in stool   . Chronic kidney disease   . COPD (chronic obstructive pulmonary disease) ([place])    pt denies on 09/27/14  . CVA (cerebral vascular accident) ([place])   . Erosive gastritis    with GI bleed thought due to elevated INR and duodenitis  . External hemorrhoids without complication   . GERD (gastroesophageal reflux disease)   . H/O hypercholesterolemia    PMH: only  . H/O transfusion of packed red blood cells   . H/O: GI bleed   . Hemorrhage of rectum and anus   . History of embolic stroke [id number]  . HTN (hypertension)   . Hyperthyroidism   . Hypothyroidism   . Hypothyroidism   . Insomnia    prev tried Trazadone (2014); ambien CR caused hangover; on ambien since 11/2013  . Iron deficiency anemia due to chronic blood loss   . Long-term (current) use of anticoagulants   . Osteoarthritis of both knees   . Osteoarthritis of lumbar spine   . Osteoporosis   . Paroxysmal atrial fibrillation (H[facility])   . Peripheral edema   . Postmenopausal   . Primary osteoarthritis of both  hips   . Pyuria   . Stroke [facility])     Past Surgical History:  Procedure Laterality Date  . ABDOMINAL HYSTERECTOMY    . BREAST ENHANCEMENT SURGERY    . CARDIOVERSION N/A 12/09/2015   Procedure: CARDIOVERSION;  Surgeon: Jerline Pain, MD;  Location: [facility];  Service: Cardiovascular;  Laterality: N/A;  . CHOLECYSTECTOMY  03/11/2012   Procedure: LAPAROSCOPIC CHOLECYSTECTOMY WITH INTRAOPERATIVE CHOLANGIOGRAM;  Surgeon: Joyice Faster. Cornett, MD;  Location: [facility];  Service: General;  Laterality: N/A;  . CHOLECYSTECTOMY    . COLONOSCOPY    . CYST EXCISION  11/2014   left chest wall--nodular hidroadenoma  . HEMORRHOID SURGERY    . LUMBAR LAMINECTOMY N/A 09/29/2014   Procedure: L4-5 Decompression, Hemi-Laminectomy,  Removal Free Fragment;  Surgeon: Marybelle Killings, MD;  Location: [facility];  Service: Orthopedics;  Laterality: N/A;  . OPERATIVE HYSTEROSCOPY    . ORIF PATELLA Left 10/25/2014   Procedure: OPEN REDUCTION INTERNAL (ORIF) FIXATION LEFT PATELLA;  Surgeon: Marybelle Killings, MD;  Location: [facility];  Service: Orthopedics;  Laterality: Left;  . TEE WITHOUT CARDIOVERSION N/A 12/09/2015   Procedure: TRANSESOPHAGEAL ECHOCARDIOGRAM (TEE);  Surgeon: Jerline Pain, MD;  Location: [facility];  Service: Cardiovascular;  Laterality: N/A;  . WISDOM TOOTH EXTRACTION      There were  no vitals filed for this visit.      Subjective Assessment - 07/26/16 1103    Subjective Pt denied falls or changes since last visit. Pt has been performing HEP. Pt reported she has to inject Levonox into abdomen prior to thyroid biopsy (08/01/16). Pt reports she been having memory issues lately.    Pertinent History A-fib, HTN, hypo and hyperthyroidism related to thyroid nodules, Hx of CVA, osteoporosis, Hx of Lx laminectomy, Hx of L patella fx, chronic kidney disease, COPD-per chart but pt denied having COPD, aortic atherosclerosis   Patient Stated Goals "I would like [place] to find out what the problem is and correct it". Improve  balance when walking.    Currently in Pain? No/denies         Neuro re-ed: Pt performed and reviewed previous and new balance HEP with cues for technique. Performed with min guard to S for safety. Please see pt instructions for details.                [facility] Adult PT Treatment/Exercise - 07/26/16 1131      Standardized Balance Assessment   Standardized Balance Assessment Dynamic Gait Index     Dynamic Gait Index   Level Surface Mild Impairment   Change in Gait Speed Mild Impairment   Gait with Horizontal Head Turns Moderate Impairment   Gait with Vertical Head Turns Moderate Impairment   Gait and Pivot Turn Mild Impairment   Step Over Obstacle Mild Impairment   Step Around Obstacles Mild Impairment   Steps Mild Impairment   Total Score 14                PT Education - 07/26/16 1148    Education provided Yes   Education Details Reviewed and progressed HEP. PT also educated pt on systems utilized for balance (somatosensory, vestibular, and vision) and that pt's vestibular system input appears to be decreased.   Person(s) Educated Patient   Methods Explanation;Demonstration;Verbal cues;Handout   Comprehension Returned demonstration;Verbalized understanding          PT Short Term Goals - 07/13/16 1133      PT SHORT TERM GOAL #1   Title same as LTGs            PT Long Term Goals - 07/26/16 1152      PT LONG TERM GOAL #1   Title Pt will be IND in HEP to improve balance and dizziness in order to perform functional activities safely. Target date: 08/10/16   Status On-going     PT LONG TERM GOAL #2   Title Pt will improve FGA score by >/=25/30 to decr. falls risk. Target date: 08/10/16   Status On-going     PT LONG TERM GOAL #3   Title Pt will amb. 1000' over even/uneven terrain, while performing head turns, IND with reports of dizziness not incr. by >/= 2points to improve functional mobility. Target date; 08/10/16   Status On-going     PT LONG  TERM GOAL #4   Title Pt will report dizziness does not surpass 2/10 during gait to improve safety during mobility. Target date: 08/10/16   Status On-going     PT LONG TERM GOAL #5   Title Pt will perform 360 degree turns with no incr. in dizziness and no LOB in order to improve functional mobility. Target date: 08/10/16   Status On-going     PT LONG TERM GOAL #6   Title Pt will improve DGI score to >/=20/24 to  decr. falls risk. Target date: 08/10/16   Baseline 14/24   Status Revised     PT LONG TERM GOAL #7   Title Improve composite score on SOT to >/= WNL's to demonstrate improved balance/vestibular function.  (08-18-16) goal established by Guido Sander, PT on 07-18-16   Baseline composite score 48/100 on 07-18-16   Time 4   Period Weeks   Status On-going               Plan - 07/26/16 1150    Clinical Impression Statement Pt continues to experience incr. postural sway and LOB during activities which challenge vestibular system. Pt reported slight incr. in 5/10 dizziness after exercises but reported it was tolerable. Pt would continue to benefit from skilled PT to improve safety during functional mobility.    Rehab Potential Good   Clinical Impairments Affecting Rehab Potential co-morbidities   PT Frequency 2x / week   PT Duration 4 weeks   PT Treatment/Interventions ADLs/Self Care Home Management;Biofeedback;Canalith Repostioning;Orthotic Fit/Training;Patient/family education;Neuromuscular re-education;Balance training;Therapeutic exercise;Therapeutic activities;Functional mobility training;Manual techniques;Vestibular;Stair training;Gait training;DME Instruction   PT Next Visit Plan Gait on compliant surfaces and high level balance to challenge vestibular system.   PT Home Exercise Plan balance on foam and amb. with head turns   Consulted and Agree with Plan of Care Patient      Patient will benefit from skilled therapeutic intervention in order to improve the following  deficits and impairments:  Abnormal gait, Decreased knowledge of use of DME, Dizziness, Decreased mobility, Decreased balance, Postural dysfunction  Visit Diagnosis: Dizziness and giddiness  Other abnormalities of gait and mobility     Problem List Patient Active Problem List   Diagnosis Date Noted  . Hypothyroidism 12/18/2015  . Multiple thyroid nodules 12/18/2015  . Essential hypertension, benign 11/15/2015  . Chronic leg pain 06/09/2015  . Aortic atherosclerosis ([place]) 01/13/2015  . Left patella fracture 10/25/2014  . Encounter for therapeutic drug monitoring 02/19/2014  . Gallstone 03/08/2012  . Hemorrhagic cystitis 03/08/2012  . Leukocytosis 03/08/2012  . Hyponatremia 03/08/2012  . Hypochloremia 03/08/2012  . Hypokalemia 03/08/2012  . Benign hypertensive heart disease without heart failure 05/17/2011  . Dyslipidemia 05/17/2011  . History of embolic stroke   . Hyperthyroidism   . Long-term (current) use of anticoagulants   . Atrial fibrillation ([place]) 04/23/2011    Miller,Jennifer L 07/26/2016, 11:54 AM  [facility] [address] [place], [place], [address] Phone: [phone]   Fax:  [phone]  Name: KAMERIN VANTIEM MRN: [id number] Date of Birth: [birth year]   Geoffry Paradise, PT,DPT 07/26/16 11:54 AM Phone: [phone] Fax: [phone]

## 2016-07-26 NOTE — Patient Instructions (Addendum)
Perform in a corner with a chair in front of you for safety OR at kitchen sink with chair behind you for safety:  Feet Together (Compliant Surface) Varied Arm Positions - Eyes Closed    Stand on compliant surface: ___pillows_____ with feet together and arms at your side. Close eyes and visualize upright position. Hold__30__ seconds. Repeat __3__ times per session. Do __1__ sessions per day.  Copyright  VHI. All rights reserved.  Feet Apart (Compliant Surface) Head Motion - Eyes Open    With eyes open, standing on compliant surface: __pillow______, feet shoulder width apart, move head slowly: up and down 10 times and side and side 10 times. Repeat __3__ times per session. Do __1__ sessions per day.  Copyright  VHI. All rights reserved.    Up / Down Head Motion    Perform in hallway with cane, or at counter. Walking on solid surface, move head and eyes toward ceiling for __2__ steps. Then, move head and eyes toward floor for __2__ steps. Repeat __4__ times per session. Do __1__ sessions per day.   Copyright  VHI. All rights reserved.  Side to Side Head Motion    Perform with cane in hallway or at counter. Walking on solid surface, turn head and eyes to left for __2__ steps.  Then, turn head and eyes to opposite side for __2__ steps. Repeat sequence __4__ times per session. Do __1__ sessions per day.  Copyright  VHI. All rights reserved.

## 2016-07-27 ENCOUNTER — Telehealth: Payer: Self-pay | Admitting: Cardiology

## 2016-07-27 ENCOUNTER — Ambulatory Visit: Payer: Medicare Other

## 2016-07-27 DIAGNOSIS — H8113 Benign paroxysmal vertigo, bilateral: Secondary | ICD-10-CM | POA: Diagnosis not present

## 2016-07-27 DIAGNOSIS — R2689 Other abnormalities of gait and mobility: Secondary | ICD-10-CM

## 2016-07-27 DIAGNOSIS — R42 Dizziness and giddiness: Secondary | ICD-10-CM

## 2016-07-27 NOTE — Telephone Encounter (Signed)
Attempted to contact pt to review her questions RE: Lovenox and why she is being started on it.  Sounded as if someone answered but didn't say anything.  I attempted to left a message however the call was then disconnected.  According to documentation she is scheduled for a tyroid BX on Aug 3.  She will be holding her coumadin for this.  She needs to be bridged d/t Hx of CVA, At Fib and age.  Will forward to [facility] for further f/u if needed.

## 2016-07-27 NOTE — Therapy (Signed)
[facility] [address] [place], [place], [address] Phone: [phone]   Fax:  [phone]  Physical Therapy Treatment  Patient Details  Name: Lisa Lucero MRN: [id number] Date of Birth: [birth year] Referring Provider: Dr. Tomi Bamberger  Encounter Date: 07/27/2016      PT End of Session - 07/27/16 1237    Visit Number 4   Number of Visits 9   Date for PT Re-Evaluation 08/12/16   Authorization Type G-CODE AND PROGRESS NOTE EVERY 10TH VISIT. Medicare primary and [employer] secondary.   PT Start Time 1149   PT Stop Time 1228   PT Time Calculation (min) 39 min   Equipment Utilized During Treatment Gait belt   Activity Tolerance Patient tolerated treatment well   Behavior During Therapy WFL for tasks assessed/performed      Past Medical History:  Diagnosis Date  . Anemia    Iron deficiency anemia  . Arthritis   . Arthropathy   . Back pain   . Blood in stool   . Chronic kidney disease   . COPD (chronic obstructive pulmonary disease) ([place])    pt denies on 09/27/14  . CVA (cerebral vascular accident) ([place])   . Erosive gastritis    with GI bleed thought due to elevated INR and duodenitis  . External hemorrhoids without complication   . GERD (gastroesophageal reflux disease)   . H/O hypercholesterolemia    PMH: only  . H/O transfusion of packed red blood cells   . H/O: GI bleed   . Hemorrhage of rectum and anus   . History of embolic stroke [id number]  . HTN (hypertension)   . Hyperthyroidism   . Hypothyroidism   . Hypothyroidism   . Insomnia    prev tried Trazadone (2014); ambien CR caused hangover; on ambien since 11/2013  . Iron deficiency anemia due to chronic blood loss   . Long-term (current) use of anticoagulants   . Osteoarthritis of both knees   . Osteoarthritis of lumbar spine   . Osteoporosis   . Paroxysmal atrial fibrillation (HCC)   . Peripheral edema   . Postmenopausal   . Primary osteoarthritis of both  hips   . Pyuria   . Stroke [facility])     Past Surgical History:  Procedure Laterality Date  . ABDOMINAL HYSTERECTOMY    . BREAST ENHANCEMENT SURGERY    . CARDIOVERSION N/A 12/09/2015   Procedure: CARDIOVERSION;  Surgeon: Jerline Pain, MD;  Location: [facility];  Service: Cardiovascular;  Laterality: N/A;  . CHOLECYSTECTOMY  03/11/2012   Procedure: LAPAROSCOPIC CHOLECYSTECTOMY WITH INTRAOPERATIVE CHOLANGIOGRAM;  Surgeon: Joyice Faster. Cornett, MD;  Location: [facility];  Service: General;  Laterality: N/A;  . CHOLECYSTECTOMY    . COLONOSCOPY    . CYST EXCISION  11/2014   left chest wall--nodular hidroadenoma  . HEMORRHOID SURGERY    . LUMBAR LAMINECTOMY N/A 09/29/2014   Procedure: L4-5 Decompression, Hemi-Laminectomy,  Removal Free Fragment;  Surgeon: Marybelle Killings, MD;  Location: [facility];  Service: Orthopedics;  Laterality: N/A;  . OPERATIVE HYSTEROSCOPY    . ORIF PATELLA Left 10/25/2014   Procedure: OPEN REDUCTION INTERNAL (ORIF) FIXATION LEFT PATELLA;  Surgeon: Marybelle Killings, MD;  Location: [facility];  Service: Orthopedics;  Laterality: Left;  . TEE WITHOUT CARDIOVERSION N/A 12/09/2015   Procedure: TRANSESOPHAGEAL ECHOCARDIOGRAM (TEE);  Surgeon: Jerline Pain, MD;  Location: [facility];  Service: Cardiovascular;  Laterality: N/A;  . WISDOM TOOTH EXTRACTION      There were  no vitals filed for this visit.      Subjective Assessment - 07/27/16 1152    Subjective Pt denied falls or changes since last visit. Pt reported she was walking to mailbox and was performing head turns, she reported dizziness increased significantly.    Pertinent History A-fib, HTN, hypo and hyperthyroidism related to thyroid nodules, Hx of CVA, osteoporosis, Hx of Lx laminectomy, Hx of L patella fx, chronic kidney disease, COPD-per chart but pt denied having COPD, aortic atherosclerosis   Patient Stated Goals "I would like [place] to find out what the problem is and correct it". Improve balance when walking.    Currently in Pain?  No/denies                         [facility] Adult PT Treatment/Exercise - 07/27/16 1155      High Level Balance   High Level Balance Activities Backward walking;Head turns;Tandem walking;Marching forwards;Negotitating around obstacles;Negotiating over obstacles   High Level Balance Comments Performed over blue/red mats with min guard to min A for safety, 4x15'/activity. Cues for technique. Pt required seated rest break to allow dizzinss to subside (twice).              Balance Exercises - 07/27/16 1234      Balance Exercises: Standing   Rockerboard Anterior/posterior;Lateral;Head turns;EO;EC;10 seconds;30 seconds;10 reps  no UE support   Other Standing Exercises Performed in // bars with min guard to min A for safety. Cues for technique and to improve ant. hip musculature activation during posterior LOB.  Pt performed wall bumps x5 reps to improve ant. hip musculature activation.           PT Education - 07/27/16 1236    Education provided Yes   Education Details PT asked pt to notify us if she has any restrictions after thyroid biopsy. PT also reiterated the importance of performing balance HEP as prescribed, and not to perform outdoors.   Person(s) Educated Patient   Methods Explanation   Comprehension Verbalized understanding          PT Short Term Goals - 07/13/16 1133      PT SHORT TERM GOAL #1   Title same as LTGs            PT Long Term Goals - 07/27/16 1241      PT LONG TERM GOAL #1   Title Pt will be IND in HEP to improve balance and dizziness in order to perform functional activities safely. Target date: 08/10/16   Status On-going     PT LONG TERM GOAL #2   Title Pt will improve FGA score by >/=25/30 to decr. falls risk. Target date: 08/10/16   Status On-going     PT LONG TERM GOAL #3   Title Pt will amb. 1000' over even/uneven terrain, while performing head turns, IND with reports of dizziness not incr. by >/= 2points to improve  functional mobility. Target date; 08/10/16   Status On-going     PT LONG TERM GOAL #4   Title Pt will report dizziness does not surpass 2/10 during gait to improve safety during mobility. Target date: 08/10/16   Status On-going     PT LONG TERM GOAL #5   Title Pt will perform 360 degree turns with no incr. in dizziness and no LOB in order to improve functional mobility. Target date: 08/10/16   Status On-going     PT LONG TERM GOAL #6   Title  Pt will improve DGI score to >/=20/24 to decr. falls risk. Target date: 08/10/16   Baseline 14/24   Status On-going     PT LONG TERM GOAL #7   Title Improve composite score on SOT to >/= WNL's to demonstrate improved balance/vestibular function.  (08-18-16) goal established by Guido Sander, PT on 07-18-16   Baseline composite score 48/100 on 07-18-16   Time 4   Period Weeks   Status On-going               Plan - 07/27/16 1237    Clinical Impression Statement Pt noted to experience difficulty activating ant. hip musculature during posterior LOB on rockerboard. Pt continues to experience incr. in dizziness and LOB during activities which challenge vestibular system. Pt would continue to benefit from skilled PT to improve safety during functional mobility.    Rehab Potential Good   Clinical Impairments Affecting Rehab Potential co-morbidities   PT Frequency 2x / week   PT Duration 4 weeks   PT Treatment/Interventions ADLs/Self Care Home Management;Biofeedback;Canalith Repostioning;Orthotic Fit/Training;Patient/family education;Neuromuscular re-education;Balance training;Therapeutic exercise;Therapeutic activities;Functional mobility training;Manual techniques;Vestibular;Stair training;Gait training;DME Instruction   PT Next Visit Plan Continue Gait on compliant surfaces and high level balance to challenge vestibular system.   PT Home Exercise Plan balance on foam and amb. with head turns   Consulted and Agree with Plan of Care Patient       Patient will benefit from skilled therapeutic intervention in order to improve the following deficits and impairments:  Abnormal gait, Decreased knowledge of use of DME, Dizziness, Decreased mobility, Decreased balance, Postural dysfunction  Visit Diagnosis: Dizziness and giddiness  Other abnormalities of gait and mobility     Problem List Patient Active Problem List   Diagnosis Date Noted  . Hypothyroidism 12/18/2015  . Multiple thyroid nodules 12/18/2015  . Essential hypertension, benign 11/15/2015  . Chronic leg pain 06/09/2015  . Aortic atherosclerosis ([place]) 01/13/2015  . Left patella fracture 10/25/2014  . Encounter for therapeutic drug monitoring 02/19/2014  . Gallstone 03/08/2012  . Hemorrhagic cystitis 03/08/2012  . Leukocytosis 03/08/2012  . Hyponatremia 03/08/2012  . Hypochloremia 03/08/2012  . Hypokalemia 03/08/2012  . Benign hypertensive heart disease without heart failure 05/17/2011  . Dyslipidemia 05/17/2011  . History of embolic stroke   . Hyperthyroidism   . Long-term (current) use of anticoagulants   . Atrial fibrillation ([place]) 04/23/2011    Jeovanny Cuadros L 07/27/2016, 12:42 PM  [facility] [address] [place], [place], [address] Phone: [phone]   Fax:  [phone]  Name: Lisa Lucero MRN: [id number] Date of Birth: [birth year]   Geoffry Paradise, PT,DPT 07/27/16 12:43 PM Phone: [phone] Fax: [phone]

## 2016-07-27 NOTE — Telephone Encounter (Signed)
New message          The pt is thyroid biopsy on Wednesday August 3 rd and the pt wants to know why she needs to be Lovenox.   The Lovenox will start after the procedure.

## 2016-07-30 NOTE — Telephone Encounter (Signed)
Spoke with pt.  Answered her questions of why we needed to do Lovenox around her procedure.  She states she understands and has the instructions to follow this week.

## 2016-07-31 ENCOUNTER — Ambulatory Visit: Payer: Medicare Other | Attending: Family Medicine | Admitting: Physical Therapy

## 2016-07-31 DIAGNOSIS — R42 Dizziness and giddiness: Secondary | ICD-10-CM

## 2016-07-31 DIAGNOSIS — R2689 Other abnormalities of gait and mobility: Secondary | ICD-10-CM | POA: Diagnosis not present

## 2016-07-31 NOTE — Therapy (Signed)
[facility] [address] [place], [place], [address] Phone: [phone]   Fax:  [phone]  Physical Therapy Treatment  Patient Details  Name: Lisa Lucero MRN: [id number] Date of Birth: 08/16/1935 Referring Provider: Dr. Tomi Bamberger  Encounter Date: 07/31/2016      PT End of Session - 07/31/16 1312    Visit Number 5   Number of Visits 9   Date for PT Re-Evaluation 08/12/16   Authorization Type G-CODE AND PROGRESS NOTE EVERY 10TH VISIT. Medicare primary and [employer] secondary.   PT Start Time 1100   PT Stop Time 1144   PT Time Calculation (min) 44 min   Equipment Utilized During Treatment Gait belt   Activity Tolerance Patient tolerated treatment well   Behavior During Therapy WFL for tasks assessed/performed      Past Medical History:  Diagnosis Date  . Anemia    Iron deficiency anemia  . Arthritis   . Arthropathy   . Back pain   . Blood in stool   . Chronic kidney disease   . COPD (chronic obstructive pulmonary disease) ([facility])    pt denies on 09/27/14  . CVA (cerebral vascular accident) ([facility])   . Erosive gastritis    with GI bleed thought due to elevated INR and duodenitis  . External hemorrhoids without complication   . GERD (gastroesophageal reflux disease)   . H/O hypercholesterolemia    PMH: only  . H/O transfusion of packed red blood cells   . H/O: GI bleed   . Hemorrhage of rectum and anus   . History of embolic stroke Q000111Q  . HTN (hypertension)   . Hyperthyroidism   . Hypothyroidism   . Hypothyroidism   . Insomnia    prev tried Trazadone (2014); ambien CR caused hangover; on ambien since 11/2013  . Iron deficiency anemia due to chronic blood loss   . Long-term (current) use of anticoagulants   . Osteoarthritis of both knees   . Osteoarthritis of lumbar spine   . Osteoporosis   . Paroxysmal atrial fibrillation (HCC)   . Peripheral edema   . Postmenopausal   . Primary osteoarthritis of both  hips   . Pyuria   . Stroke [facility])     Past Surgical History:  Procedure Laterality Date  . ABDOMINAL HYSTERECTOMY    . BREAST ENHANCEMENT SURGERY    . CARDIOVERSION N/A 12/09/2015   Procedure: CARDIOVERSION;  Surgeon: Jerline Pain, MD;  Location: [facility];  Service: Cardiovascular;  Laterality: N/A;  . CHOLECYSTECTOMY  03/11/2012   Procedure: LAPAROSCOPIC CHOLECYSTECTOMY WITH INTRAOPERATIVE CHOLANGIOGRAM;  Surgeon: Joyice Faster. Cornett, MD;  Location: [facility];  Service: General;  Laterality: N/A;  . CHOLECYSTECTOMY    . COLONOSCOPY    . CYST EXCISION  11/2014   left chest wall--nodular hidroadenoma  . HEMORRHOID SURGERY    . LUMBAR LAMINECTOMY N/A 09/29/2014   Procedure: L4-5 Decompression, Hemi-Laminectomy,  Removal Free Fragment;  Surgeon: Marybelle Killings, MD;  Location: [facility];  Service: Orthopedics;  Laterality: N/A;  . OPERATIVE HYSTEROSCOPY    . ORIF PATELLA Left 10/25/2014   Procedure: OPEN REDUCTION INTERNAL (ORIF) FIXATION LEFT PATELLA;  Surgeon: Marybelle Killings, MD;  Location: [facility];  Service: Orthopedics;  Laterality: Left;  . TEE WITHOUT CARDIOVERSION N/A 12/09/2015   Procedure: TRANSESOPHAGEAL ECHOCARDIOGRAM (TEE);  Surgeon: Jerline Pain, MD;  Location: [facility];  Service: Cardiovascular;  Laterality: N/A;  . WISDOM TOOTH EXTRACTION      There were  no vitals filed for this visit.      Subjective Assessment - 07/31/16 1107    Subjective "I actually feel pretty good today.Marland KitchenMarland KitchenI won't say that I feel like I'm twirly, but it doesn't seem like I'm quite right. My blood pressure was quite low this morning..106/64."    Pertinent History A-fib, HTN, hypo and hyperthyroidism related to thyroid nodules, Hx of CVA, osteoporosis, Hx of Lx laminectomy, Hx of L patella fx, chronic kidney disease, COPD-per chart but pt denied having COPD, aortic atherosclerosis   Patient Stated Goals "I would like [place] to find out what the problem is and correct it". Improve balance when walking.    Currently  in Pain? No/denies                Vestibular Assessment - 07/31/16 1115      Visual Acuity   Static 20/25   Dynamic 20/100  5-line difference     Orthostatics   BP supine (x 5 minutes) 135/72   HR supine (x 5 minutes) 53   BP standing (after 1 minute) 114/73  symptomatic    HR standing (after 1 minute) 56                 [facility] Adult PT Treatment/Exercise - 07/31/16 0001      Self-Care   Self-Care Other Self-Care Comments   Other Self-Care Comments  Discussed strategies for preventing, managing orthostatic hypotension with focus on hydration, postural adjustment, compression garments, and performance of LE therex. Emphasized importance of static standing > 10 seconds prior to initiating ambulating to decrease fall risk. Pt verbalized understanding.             Balance Exercises - 07/31/16 1307      Balance Exercises: Standing   Standing Eyes Opened Wide (BOA);Head turns;Foam/compliant surface;Other reps (comment)  1 pillow; horizontal, vertical turns x10 each   Standing Eyes Closed Wide (BOA);Head turns;Foam/compliant surface;Other reps (comment)  1 pillow; horizontal, vertical turns x10 each   Gait with Head Turns Forward;4 reps;Other (comment);Intermittent upper extremity support  horiz, vertical head turns 2 x50' each           PT Education - 07/31/16 1311    Education provided Yes   Education Details Reviewed/modified HEP; see Pt Instructions. Reiterated importance of standing next to wall, being on level surface while performing gait with head turns.  Provided education on prevention/management of postural hypotension.    Person(s) Educated Patient   Methods Explanation;Demonstration;Handout;Verbal cues   Comprehension Verbalized understanding;Returned demonstration          PT Short Term Goals - 07/13/16 1133      PT SHORT TERM GOAL #1   Title same as LTGs            PT Long Term Goals - 07/27/16 1241      PT LONG TERM GOAL  #1   Title Pt will be IND in HEP to improve balance and dizziness in order to perform functional activities safely. Target date: 08/10/16   Status On-going     PT LONG TERM GOAL #2   Title Pt will improve FGA score by >/=25/30 to decr. falls risk. Target date: 08/10/16   Status On-going     PT LONG TERM GOAL #3   Title Pt will amb. 1000' over even/uneven terrain, while performing head turns, IND with reports of dizziness not incr. by >/= 2points to improve functional mobility. Target date; 08/10/16   Status On-going  PT LONG TERM GOAL #4   Title Pt will report dizziness does not surpass 2/10 during gait to improve safety during mobility. Target date: 08/10/16   Status On-going     PT LONG TERM GOAL #5   Title Pt will perform 360 degree turns with no incr. in dizziness and no LOB in order to improve functional mobility. Target date: 08/10/16   Status On-going     PT LONG TERM GOAL #6   Title Pt will improve DGI score to >/=20/24 to decr. falls risk. Target date: 08/10/16   Baseline 14/24   Status On-going     PT LONG TERM GOAL #7   Title Improve composite score on SOT to >/= WNL's to demonstrate improved balance/vestibular function.  (08-18-16) goal established by Guido Sander, PT on 07-18-16   Baseline composite score 48/100 on 07-18-16   Time 4   Period Weeks   Status On-going               Plan - 07/31/16 1313    Clinical Impression Statement Session focused on assessing/addressing origin of lightheadedness, reviewing vestibular HEP. BP decreased from 135/72 to 114/73 with transition from supine > standing; accompanied by lightheadedness. Therefore, provided education on postural hypotension (prevention/management). Will route this treatment note to PCP.    Rehab Potential Good   Clinical Impairments Affecting Rehab Potential co-morbidities   PT Frequency 2x / week   PT Duration 4 weeks   PT Treatment/Interventions ADLs/Self Care Home Management;Biofeedback;Canalith  Repostioning;Orthotic Fit/Training;Patient/family education;Neuromuscular re-education;Balance training;Therapeutic exercise;Therapeutic activities;Functional mobility training;Manual techniques;Vestibular;Stair training;Gait training;DME Instruction   PT Next Visit Plan Continue Gait on compliant surfaces and high level balance to challenge vestibular system.   PT Home Exercise Plan balance on foam and amb. with head turns   Consulted and Agree with Plan of Care Patient      Patient will benefit from skilled therapeutic intervention in order to improve the following deficits and impairments:  Abnormal gait, Decreased knowledge of use of DME, Dizziness, Decreased mobility, Decreased balance, Postural dysfunction  Visit Diagnosis: Other abnormalities of gait and mobility  Dizziness and giddiness     Problem List Patient Active Problem List   Diagnosis Date Noted  . Hypothyroidism 12/18/2015  . Multiple thyroid nodules 12/18/2015  . Essential hypertension, benign 11/15/2015  . Chronic leg pain 06/09/2015  . Aortic atherosclerosis ([place]) 01/13/2015  . Left patella fracture 10/25/2014  . Encounter for therapeutic drug monitoring 02/19/2014  . Gallstone 03/08/2012  . Hemorrhagic cystitis 03/08/2012  . Leukocytosis 03/08/2012  . Hyponatremia 03/08/2012  . Hypochloremia 03/08/2012  . Hypokalemia 03/08/2012  . Benign hypertensive heart disease without heart failure 05/17/2011  . Dyslipidemia 05/17/2011  . History of embolic stroke   . Hyperthyroidism   . Long-term (current) use of anticoagulants   . Atrial fibrillation ([place]) 04/23/2011    Billie Ruddy, PT, DPT [facility] [address] [place], [place], [address] Phone: [phone]   Fax:  [phone] 07/31/16, 1:16 PM  Name: MADELYNE TAYLOE MRN: [id number] Date of Birth: 1935-07-25

## 2016-07-31 NOTE — Patient Instructions (Addendum)
Perform in a corner with a chair in front of you for safety OR at kitchen sink with chair behind you for safety:  Feet Apart (Compliant Surface) Head Motion - Eyes Open    Standing on __1 pillow______, feet shoulder width apart,  1. With eyes open: move head slowly: up and down 10 times and side and side 10 times. 2. With eyes open: move head slowly: up and down 10 times and side and side 10 times.  Repeat __3__ times per session. Do __1__ sessions per day.   Up / Down Head Motion    Perform standing close to the wall in your hallway (not outside), using the wall for balance, as needed. Walking on solid surface, move head and eyes toward ceiling for __2__ steps. Then, move head and eyes toward floor for __2__ steps. Repeat __4__ times per session. Do __1__ sessions per day.   Copyright  VHI. All rights reserved.  Side to Side Head Motion    Perform standing close to the wall in your hallway (not outside), using the wall for balance, as needed. Walking on solid surface, turn head and eyes to left for __2__ steps.  Then, turn head and eyes to opposite side for __2__ steps. Repeat sequence __4__ times per session. Do __1__ sessions per day.  Copyright  VHI. All rights reserved.   Lisa Lucero, your blood pressure dropped today when you transitioned from lying down to standing up. You could have postural hypotension (see description below). I'm going to send your note to your primary provider, Dr. Tomi Bamberger.    Orthostatic Hypotension Orthostatic hypotension is a sudden drop in blood pressure. It happens when you quickly stand up from a seated or lying position. You may feel dizzy or light-headed. This can last for just a few seconds or for up to a few minutes. It is usually not a serious problem. However, if this happens frequently or gets worse, it can be a sign of something more serious. CAUSES  Different things can cause orthostatic hypotension, including:   Loss of body fluids  (dehydration).  Medicines that lower blood pressure.  Sudden changes in posture, such as standing up quickly after you have been sitting or lying down.  Taking too much of your medicine. SIGNS AND SYMPTOMS   Light-headedness or dizziness.   Fainting or near-fainting.   A fast heart rate.   Weakness.   Feeling tired (fatigue).  DIAGNOSIS  Your health care provider may do several things to help diagnose your condition and identify the cause. These may include:   Taking a medical history and doing a physical exam.  Checking your blood pressure. Your health care provider will check your blood pressure when you are:  Lying down.  Sitting.  Standing.  Using tilt table testing. In this test, you lie down on a table that moves from a lying position to a standing position. You will be strapped onto the table. This test monitors your blood pressure and heart rate when you are in different positions. TREATMENT  Treatment will vary depending on the cause. Possible treatments include:   Changing the dosage of your medicines.  Wearing compression stockings on your lower legs.  Standing up slowly after sitting or lying down.  Eating more salt.  Eating frequent, small meals.  In some cases, getting IV fluids.  Taking medicine to enhance fluid retention. HOME CARE INSTRUCTIONS  Only take over-the-counter or prescription medicines as directed by your health care provider.  Follow your health  care provider's instructions for changing the dosage of your current medicines.  Do not stop or adjust your medicine on your own.  Stand up slowly after sitting or lying down. This allows your body to adjust to the different position.  Wear compression stockings as directed.  Eat extra salt as directed.  Do not add extra salt to your diet unless directed to by your health care provider.  Eat frequent, small meals.  Avoid standing suddenly after eating.  Avoid hot showers or  excessive heat as directed by your health care provider.  Keep all follow-up appointments. SEEK MEDICAL CARE IF:  You continue to feel dizzy or light-headed after standing.  You feel groggy or confused.  You feel cold, clammy, or sick to your stomach (nauseous).  You have blurred vision.  You feel short of breath. SEEK IMMEDIATE MEDICAL CARE IF:   You faint after standing.  You have chest pain.  You have difficulty breathing.   You lose feeling or movement in your arms or legs.   You have slurred speech or difficulty talking, or you are unable to talk.  MAKE SURE YOU:   Understand these instructions.  Will watch your condition.  Will get help right away if you are not doing well or get worse.   This information is not intended to replace advice given to you by your health care provider. Make sure you discuss any questions you have with your health care provider.   Document Released: 12/07/2002 Document Revised: 12/22/2013 Document Reviewed: 10/09/2013 Elsevier Interactive Patient Education [employer].

## 2016-08-01 ENCOUNTER — Ambulatory Visit
Admission: RE | Admit: 2016-08-01 | Discharge: 2016-08-01 | Disposition: A | Discharge status: Home / Self Care | Payer: Medicare Other | Source: Ambulatory Visit | Attending: Family Medicine | Admitting: Family Medicine

## 2016-08-01 ENCOUNTER — Other Ambulatory Visit (HOSPITAL_COMMUNITY)
Admission: RE | Admit: 2016-08-01 | Discharge: 2016-08-01 | Disposition: A | Discharge status: Home / Self Care | Payer: Medicare Other | Source: Ambulatory Visit | Attending: Radiology | Admitting: Radiology

## 2016-08-01 ENCOUNTER — Ambulatory Visit (INDEPENDENT_AMBULATORY_CARE_PROVIDER_SITE_OTHER): Payer: Medicare Other | Admitting: *Deleted

## 2016-08-01 DIAGNOSIS — I4891 Unspecified atrial fibrillation: Secondary | ICD-10-CM | POA: Diagnosis not present

## 2016-08-01 DIAGNOSIS — E041 Nontoxic single thyroid nodule: Secondary | ICD-10-CM | POA: Insufficient documentation

## 2016-08-01 DIAGNOSIS — Z5181 Encounter for therapeutic drug level monitoring: Secondary | ICD-10-CM | POA: Diagnosis not present

## 2016-08-01 DIAGNOSIS — E042 Nontoxic multinodular goiter: Secondary | ICD-10-CM

## 2016-08-01 LAB — POCT INR: INR: 1.1

## 2016-08-01 NOTE — Patient Instructions (Signed)
08/01/16-  Day of Biopsy- No Lovenox injection. Biopsy at 1240pm. Restart Coumadin tonight after Biopsy, take normal dose on 1/2 tablet.    08/02/16- Restart Lovenox injection 80mg  in the fatty abdominal tissue at least 2 inches from the belly button at 8am, rotate sites.  Take Normal Coumadin dose of 1 tablet.  08/03/16- Inject Lovenox 80mg  in the fatty abdominal tissue at least 2 inches from the belly button at 8am,  rotate sites. Take Normal Coumadin dosage.   08/04/16-  Inject Lovenox 80mg  in the fatty abdominal tissue at least 2 inches from the belly button at 8am,  rotate sites. Take Normal Coumadin dosage.   08/05/16- Inject Lovenox 80mg  in the fatty abdominal tissue at least 2 inches from the belly button at 8am,  rotate sites. Take Normal Coumadin dosage.   08/06/16-  Inject Lovenox 80mg  in the fatty abdominal tissue at least 2 inches from the belly button at 8am,  rotate sites. Coumadin appt at 1115am

## 2016-08-03 ENCOUNTER — Ambulatory Visit: Payer: Medicare Other

## 2016-08-03 DIAGNOSIS — R2689 Other abnormalities of gait and mobility: Secondary | ICD-10-CM

## 2016-08-03 DIAGNOSIS — R42 Dizziness and giddiness: Secondary | ICD-10-CM | POA: Diagnosis not present

## 2016-08-03 NOTE — Patient Instructions (Addendum)
Perform in a corner with a chair in front of you for safety OR at kitchen sink with chair behind you for safety:    Up / Down Head Motion    Perform standing close to the wall in your hallway (not outside), using the wall for balance, as needed. Walking on solid surface, move head and eyes toward ceiling for __2__ steps. Then, move head and eyes toward floor for __2__ steps. Repeat __4__ times per session. Do __1__ sessions per day.   Copyright  VHI. All rights reserved. Side to Side Head Motion    Perform standing close to the wall in your hallway (not outside), using the wall for balance, as needed. Walking on solid surface, turn head and eyes to left for __2__ steps. Then, turn head and eyes to opposite side for __2__ steps. Repeat sequence __4__ times per session. Do __1__ sessions per day.  Copyright  VHI. All rights reserved.Feet Together (Compliant Surface) Head Motion - Eyes Open    With eyes open, standing on compliant surface: ___1 pillow_____, feet together, move head slowly: up and down 10 times and side to side 10 times. Repeat __3__ times per session. Do __1__ sessions per day.  Copyright  VHI. All rights reserved.     Feet Apart (Compliant Surface) Head Motion - Eyes Open    Standing on __1 pillow______, feet shoulder width apart,  1. With eyes open: move head slowly: up and down 10 times and side and side 10 times. 2. With eyes open: move head slowly: up and down 10 times and side and side 10 times.  Repeat __3__ times per session. Do __1__ sessions per day.

## 2016-08-03 NOTE — Therapy (Signed)
[facility] [address] [place], [place], [address] Phone: [phone]   Fax:  [phone]  Physical Therapy Treatment  Patient Details  Name: Lisa Lucero MRN: [id number] Date of Birth: 14-Apr-1935 Referring Provider: Dr. Tomi Bamberger  Encounter Date: 08/03/2016      PT End of Session - 08/03/16 1154    Visit Number 6   Number of Visits 9   Date for PT Re-Evaluation 08/12/16   Authorization Type G-CODE AND PROGRESS NOTE EVERY 10TH VISIT. Medicare primary and [employer] secondary.   PT Start Time 1102   PT Stop Time 1141   PT Time Calculation (min) 39 min   Equipment Utilized During Treatment Gait belt   Activity Tolerance Patient tolerated treatment well   Behavior During Therapy WFL for tasks assessed/performed      Past Medical History:  Diagnosis Date  . Anemia    Iron deficiency anemia  . Arthritis   . Arthropathy   . Back pain   . Blood in stool   . Chronic kidney disease   . COPD (chronic obstructive pulmonary disease) ([place])    pt denies on 09/27/14  . CVA (cerebral vascular accident) ([place])   . Erosive gastritis    with GI bleed thought due to elevated INR and duodenitis  . External hemorrhoids without complication   . GERD (gastroesophageal reflux disease)   . H/O hypercholesterolemia    PMH: only  . H/O transfusion of packed red blood cells   . H/O: GI bleed   . Hemorrhage of rectum and anus   . History of embolic stroke [id number]  . HTN (hypertension)   . Hyperthyroidism   . Hypothyroidism   . Hypothyroidism   . Insomnia    prev tried Trazadone (2014); ambien CR caused hangover; on ambien since 11/2013  . Iron deficiency anemia due to chronic blood loss   . Long-term (current) use of anticoagulants   . Osteoarthritis of both knees   . Osteoarthritis of lumbar spine   . Osteoporosis   . Paroxysmal atrial fibrillation (HCC)   . Peripheral edema   . Postmenopausal   . Primary osteoarthritis of both  hips   . Pyuria   . Stroke [facility])     Past Surgical History:  Procedure Laterality Date  . ABDOMINAL HYSTERECTOMY    . BREAST ENHANCEMENT SURGERY    . CARDIOVERSION N/A 12/09/2015   Procedure: CARDIOVERSION;  Surgeon: Jerline Pain, MD;  Location: [place];  Service: Cardiovascular;  Laterality: N/A;  . CHOLECYSTECTOMY  03/11/2012   Procedure: LAPAROSCOPIC CHOLECYSTECTOMY WITH INTRAOPERATIVE CHOLANGIOGRAM;  Surgeon: Joyice Faster. Cornett, MD;  Location: [place];  Service: General;  Laterality: N/A;  . CHOLECYSTECTOMY    . COLONOSCOPY    . CYST EXCISION  11/2014   left chest wall--nodular hidroadenoma  . HEMORRHOID SURGERY    . LUMBAR LAMINECTOMY N/A 09/29/2014   Procedure: L4-5 Decompression, Hemi-Laminectomy,  Removal Free Fragment;  Surgeon: Marybelle Killings, MD;  Location: [place];  Service: Orthopedics;  Laterality: N/A;  . OPERATIVE HYSTEROSCOPY    . ORIF PATELLA Left 10/25/2014   Procedure: OPEN REDUCTION INTERNAL (ORIF) FIXATION LEFT PATELLA;  Surgeon: Marybelle Killings, MD;  Location: [place];  Service: Orthopedics;  Laterality: Left;  . TEE WITHOUT CARDIOVERSION N/A 12/09/2015   Procedure: TRANSESOPHAGEAL ECHOCARDIOGRAM (TEE);  Surgeon: Jerline Pain, MD;  Location: [place];  Service: Cardiovascular;  Laterality: N/A;  . WISDOM TOOTH EXTRACTION      There were  no vitals filed for this visit.      Subjective Assessment - 08/03/16 1105    Subjective Pt denied falls or changes since last visit. Pt's BP was 103/68 this morning and she felt a little lightheadedness but feels better now. Pt denied dizziness at beginning of session.   Pertinent History A-fib, HTN, hypo and hyperthyroidism related to thyroid nodules, Hx of CVA, osteoporosis, Hx of Lx laminectomy, Hx of L patella fx, chronic kidney disease, COPD-per chart but pt denied having COPD, aortic atherosclerosis   Patient Stated Goals "I would like [place] to find out what the problem is and correct it". Improve balance when walking.     Currently in Pain? No/denies                         [facility] Adult PT Treatment/Exercise - 08/03/16 1120      Ambulation/Gait   Ambulation/Gait Yes   Ambulation/Gait Assistance 4: Min guard;5: Supervision   Ambulation/Gait Assistance Details Pt amb. over even/uneven terrain while performing dynamic gait activities, head turns/nods and changes in gait speed. Cues to reduce narrow BOS during head turns.    Ambulation Distance (Feet) 500 Feet   Assistive device None   Gait Pattern Step-through pattern;Decreased arm swing - right;Decreased arm swing - left;Narrow base of support   Ambulation Surface Level;Unlevel;Indoor;Outdoor;Paved;Gravel;Grass;Other (comment)  rubber mulch             Balance Exercises - 08/03/16 1129      Balance Exercises: Standing   Standing Eyes Opened Wide (BOA) and narrow BOS;Head turns;Foam/compliant surface;Other reps (comment)  10 reps   Standing Eyes Closed Wide (BOA) and narrow BOS;Head turns;Foam/compliant surface;Other reps (comment)  10 reps   Tandem Gait 4 reps;Foam/compliant surface;Forward  no UE support   Other Standing Exercises Performed in // bars with min guard to min A for safety. Cues for technique and to improve ant. wt. shifting during tandem gait on beam.     Progressed HEP please see pt instructions for details.       PT Education - 08/03/16 1153    Education provided Yes   Education Details Reviewed and progressed HEP and reiterated the importance of performing in corner and counter/wall for safety. PT discussed that next week we will assess goals and d/c as appropriate.   Person(s) Educated Patient   Methods Explanation;Demonstration;Verbal cues;Handout   Comprehension Verbalized understanding;Returned demonstration          PT Short Term Goals - 07/13/16 1133      PT SHORT TERM GOAL #1   Title same as LTGs            PT Long Term Goals - 07/27/16 1241      PT LONG TERM GOAL #1   Title Pt will  be IND in HEP to improve balance and dizziness in order to perform functional activities safely. Target date: 08/10/16   Status On-going     PT LONG TERM GOAL #2   Title Pt will improve FGA score by >/=25/30 to decr. falls risk. Target date: 08/10/16   Status On-going     PT LONG TERM GOAL #3   Title Pt will amb. 1000' over even/uneven terrain, while performing head turns, IND with reports of dizziness not incr. by >/= 2points to improve functional mobility. Target date; 08/10/16   Status On-going     PT LONG TERM GOAL #4   Title Pt will report dizziness does not surpass 2/10  during gait to improve safety during mobility. Target date: 08/10/16   Status On-going     PT LONG TERM GOAL #5   Title Pt will perform 360 degree turns with no incr. in dizziness and no LOB in order to improve functional mobility. Target date: 08/10/16   Status On-going     PT LONG TERM GOAL #6   Title Pt will improve DGI score to >/=20/24 to decr. falls risk. Target date: 08/10/16   Baseline 14/24   Status On-going     PT LONG TERM GOAL #7   Title Improve composite score on SOT to >/= WNL's to demonstrate improved balance/vestibular function.  (08-18-16) goal established by Guido Sander, PT on 07-18-16   Baseline composite score 48/100 on 07-18-16   Time 4   Period Weeks   Status On-going               Plan - 08/03/16 1154    Clinical Impression Statement Pt demonstrated progress as she was able to progress corner balance HEP to feet together vs. feet apart. Pt continues to exhibit intermittent narrow BOS during head turns while amb. Pt reported no incr. in dizziness during session. Continue with POC.    Rehab Potential Good   Clinical Impairments Affecting Rehab Potential co-morbidities   PT Frequency 2x / week   PT Duration 4 weeks   PT Treatment/Interventions ADLs/Self Care Home Management;Biofeedback;Canalith Repostioning;Orthotic Fit/Training;Patient/family education;Neuromuscular  re-education;Balance training;Therapeutic exercise;Therapeutic activities;Functional mobility training;Manual techniques;Vestibular;Stair training;Gait training;DME Instruction   PT Next Visit Plan check goals    PT Home Exercise Plan balance on foam and amb. with head turns   Consulted and Agree with Plan of Care Patient      Patient will benefit from skilled therapeutic intervention in order to improve the following deficits and impairments:  Abnormal gait, Decreased knowledge of use of DME, Dizziness, Decreased mobility, Decreased balance, Postural dysfunction  Visit Diagnosis: Other abnormalities of gait and mobility  Dizziness and giddiness     Problem List Patient Active Problem List   Diagnosis Date Noted  . Hypothyroidism 12/18/2015  . Multiple thyroid nodules 12/18/2015  . Essential hypertension, benign 11/15/2015  . Chronic leg pain 06/09/2015  . Aortic atherosclerosis ([place]) 01/13/2015  . Left patella fracture 10/25/2014  . Encounter for therapeutic drug monitoring 02/19/2014  . Gallstone 03/08/2012  . Hemorrhagic cystitis 03/08/2012  . Leukocytosis 03/08/2012  . Hyponatremia 03/08/2012  . Hypochloremia 03/08/2012  . Hypokalemia 03/08/2012  . Benign hypertensive heart disease without heart failure 05/17/2011  . Dyslipidemia 05/17/2011  . History of embolic stroke   . Hyperthyroidism   . Long-term (current) use of anticoagulants   . Atrial fibrillation ([place]) 04/23/2011    Ellise Kovack L 08/03/2016, 11:56 AM  [facility] [address] [place], [place], [address] Phone: [phone]   Fax:  [phone]  Name: Lisa Lucero MRN: [id number] Date of Birth: [birth year]   Geoffry Paradise, PT,DPT 08/03/16 11:56 AM Phone: [phone] Fax: [phone]

## 2016-08-06 ENCOUNTER — Ambulatory Visit (INDEPENDENT_AMBULATORY_CARE_PROVIDER_SITE_OTHER): Payer: Medicare Other | Admitting: Pharmacist

## 2016-08-06 DIAGNOSIS — I4891 Unspecified atrial fibrillation: Secondary | ICD-10-CM | POA: Diagnosis not present

## 2016-08-06 DIAGNOSIS — Z5181 Encounter for therapeutic drug level monitoring: Secondary | ICD-10-CM | POA: Diagnosis not present

## 2016-08-06 LAB — POCT INR: INR: 1.7

## 2016-08-07 ENCOUNTER — Ambulatory Visit: Payer: Medicare Other | Admitting: Physical Therapy

## 2016-08-07 DIAGNOSIS — R42 Dizziness and giddiness: Secondary | ICD-10-CM

## 2016-08-07 DIAGNOSIS — R2689 Other abnormalities of gait and mobility: Secondary | ICD-10-CM

## 2016-08-07 NOTE — Therapy (Signed)
[facility] [address] [place], [place], [address] Phone: [phone]   Fax:  [phone]  Physical Therapy Treatment  Patient Details  Name: Lisa Lucero MRN: [id number] Date of Birth: [birth year] Referring Provider: Dr. Tomi Bamberger  Encounter Date: 08/07/2016      PT End of Session - 08/07/16 1249    Visit Number 7   Number of Visits 9   Date for PT Re-Evaluation 08/12/16   Authorization Type G-CODE AND PROGRESS NOTE EVERY 10TH VISIT. Medicare primary and [employer] secondary.   PT Start Time 1155   PT Stop Time 1233   PT Time Calculation (min) 38 min   Equipment Utilized During Treatment Other (comment)  Balance Master and harness   Activity Tolerance Patient tolerated treatment well   Behavior During Therapy WFL for tasks assessed/performed      Past Medical History:  Diagnosis Date  . Anemia    Iron deficiency anemia  . Arthritis   . Arthropathy   . Back pain   . Blood in stool   . Chronic kidney disease   . COPD (chronic obstructive pulmonary disease) ([place])    pt denies on 09/27/14  . CVA (cerebral vascular accident) ([place])   . Erosive gastritis    with GI bleed thought due to elevated INR and duodenitis  . External hemorrhoids without complication   . GERD (gastroesophageal reflux disease)   . H/O hypercholesterolemia    PMH: only  . H/O transfusion of packed red blood cells   . H/O: GI bleed   . Hemorrhage of rectum and anus   . History of embolic stroke 04/6567  . HTN (hypertension)   . Hyperthyroidism   . Hypothyroidism   . Hypothyroidism   . Insomnia    prev tried Trazadone (2014); ambien CR caused hangover; on ambien since 11/2013  . Iron deficiency anemia due to chronic blood loss   . Long-term (current) use of anticoagulants   . Osteoarthritis of both knees   . Osteoarthritis of lumbar spine   . Osteoporosis   . Paroxysmal atrial fibrillation (HCC)   . Peripheral edema   . Postmenopausal    . Primary osteoarthritis of both hips   . Pyuria   . Stroke [facility])     Past Surgical History:  Procedure Laterality Date  . ABDOMINAL HYSTERECTOMY    . BREAST ENHANCEMENT SURGERY    . CARDIOVERSION N/A 12/09/2015   Procedure: CARDIOVERSION;  Surgeon: Jerline Pain, MD;  Location: [facility];  Service: Cardiovascular;  Laterality: N/A;  . CHOLECYSTECTOMY  03/11/2012   Procedure: LAPAROSCOPIC CHOLECYSTECTOMY WITH INTRAOPERATIVE CHOLANGIOGRAM;  Surgeon: Joyice Faster. Cornett, MD;  Location: [place];  Service: General;  Laterality: N/A;  . CHOLECYSTECTOMY    . COLONOSCOPY    . CYST EXCISION  11/2014   left chest wall--nodular hidroadenoma  . HEMORRHOID SURGERY    . LUMBAR LAMINECTOMY N/A 09/29/2014   Procedure: L4-5 Decompression, Hemi-Laminectomy,  Removal Free Fragment;  Surgeon: Marybelle Killings, MD;  Location: [place];  Service: Orthopedics;  Laterality: N/A;  . OPERATIVE HYSTEROSCOPY    . ORIF PATELLA Left 10/25/2014   Procedure: OPEN REDUCTION INTERNAL (ORIF) FIXATION LEFT PATELLA;  Surgeon: Marybelle Killings, MD;  Location: [place];  Service: Orthopedics;  Laterality: Left;  . TEE WITHOUT CARDIOVERSION N/A 12/09/2015   Procedure: TRANSESOPHAGEAL ECHOCARDIOGRAM (TEE);  Surgeon: Jerline Pain, MD;  Location: [place];  Service: Cardiovascular;  Laterality: N/A;  . WISDOM TOOTH EXTRACTION  There were no vitals filed for this visit.      Subjective Assessment - 08/07/16 1159    Subjective Pt reports no falls. "My balance feels better...it's a little awkward when I turn, but I don't know if that's going to change."   Pertinent History A-fib, HTN, hypo and hyperthyroidism related to thyroid nodules, Hx of CVA, osteoporosis, Hx of Lx laminectomy, Hx of L patella fx, chronic kidney disease, COPD-per chart but pt denied having COPD, aortic atherosclerosis   Patient Stated Goals "I would like [place] to find out what the problem is and correct it". Improve balance when walking.    Currently in Pain?  No/denies            [facility] PT Assessment - 08/07/16 0001      Functional Gait  Assessment   Gait assessed  Yes   Gait Level Surface Walks 20 ft in less than 5.5 sec, no assistive devices, good speed, no evidence for imbalance, normal gait pattern, deviates no more than 6 in outside of the 12 in walkway width.   Change in Gait Speed Able to smoothly change walking speed without loss of balance or gait deviation. Deviate no more than 6 in outside of the 12 in walkway width.   Gait with Horizontal Head Turns Performs head turns with moderate changes in gait velocity, slows down, deviates 10-15 in outside 12 in walkway width but recovers, can continue to walk.   Gait with Vertical Head Turns Performs task with slight change in gait velocity (eg, minor disruption to smooth gait path), deviates 6 - 10 in outside 12 in walkway width or uses assistive device   Gait and Pivot Turn Pivot turns safely within 3 sec and stops quickly with no loss of balance.   Step Over Obstacle Is able to step over 2 stacked shoe boxes taped together (9 in total height) without changing gait speed. No evidence of imbalance.   Gait with Narrow Base of Support Ambulates less than 4 steps heel to toe or cannot perform without assistance.   Gait with Eyes Closed Walks 20 ft, uses assistive device, slower speed, mild gait deviations, deviates 6-10 in outside 12 in walkway width. Ambulates 20 ft in less than 9 sec but greater than 7 sec.   Ambulating Backwards Walks 20 ft, no assistive devices, good speed, no evidence for imbalance, normal gait   Steps Two feet to a stair, must use rail.   Total Score 21            Vestibular Assessment - 08/07/16 0001      Balancemaster   Therapist, occupational Comment Composite score = 62% compared to age/height normative value of 64%. Sensory analysis: Vestibular = 60 (norm = 50); Visual = 52 (norm = 74); Somatosensory = 95 (norm = 80). Findings  suggest moderately impaired pt ability to use visual input for balance. 2 "falls" sustained, 1 during Condition 4 and 1 during Condition 6. Ankle strategy dominant during Conditions 4 and 6. COG alignment: grossly midline.                  [facility] Adult PT Treatment/Exercise - 08/07/16 0001      Dynamic Gait Index   Level Surface Normal   Change in Gait Speed Normal   Gait with Horizontal Head Turns Moderate Impairment   Gait with Vertical Head Turns Mild Impairment   Gait and Pivot Turn Normal   Step Over Obstacle Normal  Step Around Obstacles Normal   Steps Mild Impairment   Total Score 20             Balance Exercises - 08/07/16 1244      Balance Exercises: Standing   Standing Eyes Opened Narrow base of support (BOS);Head turns;Foam/compliant surface;Other reps (comment)  1 pillow; horizontal, vert head turns x10 each   Standing Eyes Closed Wide (BOA);Head turns;Foam/compliant surface  1 pillow; horizontal, vertical head turns x10 each   Gait with Head Turns Forward;Intermittent upper extremity support;2 reps  x30' with vertical then horizontal head turns           PT Education - 08/07/16 1242    Education provided Yes   Education Details Explained DGI, FGA, and SOT findings, progress, and functional implications. Addressed pt request to DC from PT today by recommending pt return to last scheduled session with primary PT on 8/11.    Person(s) Educated Patient   Methods Explanation;Handout   Comprehension Verbalized understanding          PT Short Term Goals - 07/13/16 1133      PT SHORT TERM GOAL #1   Title same as LTGs            PT Long Term Goals - 08/07/16 1203      PT LONG TERM GOAL #1   Title Pt will be IND in HEP to improve balance and dizziness in order to perform functional activities safely. Target date: 08/10/16   Baseline 8/8: performed HEP with mod I using paper handout   Status Achieved     PT LONG TERM GOAL #2   Title Pt will  improve FGA score by >/=25/30 to decr. falls risk. Target date: 08/10/16   Baseline 8/8: FGA = 21/30   Status Partially Met     PT LONG TERM GOAL #3   Title Pt will amb. 1000' over even/uneven terrain, while performing head turns, IND with reports of dizziness not incr. by >/= 2points to improve functional mobility. Target date; 08/10/16   Status On-going     PT LONG TERM GOAL #4   Title Pt will report dizziness does not surpass 2/10 during gait to improve safety during mobility. Target date: 08/10/16   Status On-going     PT LONG TERM GOAL #5   Title Pt will perform 360 degree turns with no incr. in dizziness and no LOB in order to improve functional mobility. Target date: 08/10/16   Status On-going     PT LONG TERM GOAL #6   Title Pt will improve DGI score to >/=20/24 to decr. falls risk. Target date: 08/10/16   Baseline 8/8: FGA = 20/24   Status Achieved     PT LONG TERM GOAL #7   Title Improve composite score on SOT to >/= WNL's to demonstrate improved balance/vestibular function.  (08-18-16) goal established by Guido Sander, PT on 07-18-16   Baseline 8/8: SOT composite score = 62% (age/height norm = 64%)   Time 4   Period Weeks   Status Partially Met               Plan - 08/07/16 1250    Clinical Impression Statement Assessed LTG's addressing DGI, FGA, and SOT during this session. Pt met LTG for DGI, as score improved to 20/24. FGA score has improved to 21/30, but not to goal-level of 25/30. SOT composite score improved to 62%, but continues to be just below age/height normative value of 64%.  Pt was  able to perform HEP with mod I during this session.    Rehab Potential Good   Clinical Impairments Affecting Rehab Potential co-morbidities   PT Frequency 2x / week   PT Duration 4 weeks   PT Treatment/Interventions ADLs/Self Care Home Management;Biofeedback;Canalith Repostioning;Orthotic Fit/Training;Patient/family education;Neuromuscular re-education;Balance  training;Therapeutic exercise;Therapeutic activities;Functional mobility training;Manual techniques;Vestibular;Stair training;Gait training;DME Instruction   PT Next Visit Plan Finish checking LTG's and DC vs. recert; GCODES, FOTO   PT Home Exercise Plan balance on foam and amb. with head turns   Consulted and Agree with Plan of Care Patient      Patient will benefit from skilled therapeutic intervention in order to improve the following deficits and impairments:  Abnormal gait, Decreased knowledge of use of DME, Dizziness, Decreased mobility, Decreased balance, Postural dysfunction  Visit Diagnosis: Other abnormalities of gait and mobility  Dizziness and giddiness     Problem List Patient Active Problem List   Diagnosis Date Noted  . Hypothyroidism 12/18/2015  . Multiple thyroid nodules 12/18/2015  . Essential hypertension, benign 11/15/2015  . Chronic leg pain 06/09/2015  . Aortic atherosclerosis ([place]) 01/13/2015  . Left patella fracture 10/25/2014  . Encounter for therapeutic drug monitoring 02/19/2014  . Gallstone 03/08/2012  . Hemorrhagic cystitis 03/08/2012  . Leukocytosis 03/08/2012  . Hyponatremia 03/08/2012  . Hypochloremia 03/08/2012  . Hypokalemia 03/08/2012  . Benign hypertensive heart disease without heart failure 05/17/2011  . Dyslipidemia 05/17/2011  . History of embolic stroke   . Hyperthyroidism   . Long-term (current) use of anticoagulants   . Atrial fibrillation ([place]) 04/23/2011   Billie Ruddy, PT, DPT [facility] [address] [place], [place], [address] Phone: [phone]   Fax:  [phone] 08/07/16, 12:54 PM  Name: Lisa Lucero MRN: [id number] Date of Birth: 07/09/1935

## 2016-08-10 ENCOUNTER — Ambulatory Visit: Payer: Medicare Other

## 2016-08-14 ENCOUNTER — Ambulatory Visit (INDEPENDENT_AMBULATORY_CARE_PROVIDER_SITE_OTHER): Payer: Medicare Other

## 2016-08-14 ENCOUNTER — Ambulatory Visit: Payer: Medicare Other

## 2016-08-14 DIAGNOSIS — R42 Dizziness and giddiness: Secondary | ICD-10-CM

## 2016-08-14 DIAGNOSIS — R2689 Other abnormalities of gait and mobility: Secondary | ICD-10-CM | POA: Diagnosis not present

## 2016-08-14 DIAGNOSIS — Z5181 Encounter for therapeutic drug level monitoring: Secondary | ICD-10-CM

## 2016-08-14 DIAGNOSIS — I4891 Unspecified atrial fibrillation: Secondary | ICD-10-CM

## 2016-08-14 LAB — POCT INR: INR: 1.9

## 2016-08-14 NOTE — Therapy (Signed)
[facility] [address] [place], [place], [address] Phone: [phone]   Fax:  [phone]  Physical Therapy Treatment  Patient Details  Name: Lisa Lucero MRN: [id number] Date of Birth: [birth year] Referring Provider: Dr. Tomi Bamberger  Encounter Date: 08/14/2016      PT End of Session - 08/14/16 1216    Visit Number 8   Number of Visits 9   Date for PT Re-Evaluation 08/12/16   Authorization Type G-CODE AND PROGRESS NOTE EVERY 10TH VISIT. Medicare primary and [employer] secondary.   PT Start Time 1146   PT Stop Time 1209   PT Time Calculation (min) 23 min   Activity Tolerance Patient tolerated treatment well   Behavior During Therapy WFL for tasks assessed/performed      Past Medical History:  Diagnosis Date  . Anemia    Iron deficiency anemia  . Arthritis   . Arthropathy   . Back pain   . Blood in stool   . Chronic kidney disease   . COPD (chronic obstructive pulmonary disease) ([facility])    pt denies on 09/27/14  . CVA (cerebral vascular accident) ([facility])   . Erosive gastritis    with GI bleed thought due to elevated INR and duodenitis  . External hemorrhoids without complication   . GERD (gastroesophageal reflux disease)   . H/O hypercholesterolemia    PMH: only  . H/O transfusion of packed red blood cells   . H/O: GI bleed   . Hemorrhage of rectum and anus   . History of embolic stroke 02/3434  . HTN (hypertension)   . Hyperthyroidism   . Hypothyroidism   . Hypothyroidism   . Insomnia    prev tried Trazadone (2014); ambien CR caused hangover; on ambien since 11/2013  . Iron deficiency anemia due to chronic blood loss   . Long-term (current) use of anticoagulants   . Osteoarthritis of both knees   . Osteoarthritis of lumbar spine   . Osteoporosis   . Paroxysmal atrial fibrillation (HCC)   . Peripheral edema   . Postmenopausal   . Primary osteoarthritis of both hips   . Pyuria   . Stroke [facility])     Past  Surgical History:  Procedure Laterality Date  . ABDOMINAL HYSTERECTOMY    . BREAST ENHANCEMENT SURGERY    . CARDIOVERSION N/A 12/09/2015   Procedure: CARDIOVERSION;  Surgeon: Jerline Pain, MD;  Location: [facility];  Service: Cardiovascular;  Laterality: N/A;  . CHOLECYSTECTOMY  03/11/2012   Procedure: LAPAROSCOPIC CHOLECYSTECTOMY WITH INTRAOPERATIVE CHOLANGIOGRAM;  Surgeon: Joyice Faster. Cornett, MD;  Location: [facility];  Service: General;  Laterality: N/A;  . CHOLECYSTECTOMY    . COLONOSCOPY    . CYST EXCISION  11/2014   left chest wall--nodular hidroadenoma  . HEMORRHOID SURGERY    . LUMBAR LAMINECTOMY N/A 09/29/2014   Procedure: L4-5 Decompression, Hemi-Laminectomy,  Removal Free Fragment;  Surgeon: Marybelle Killings, MD;  Location: [facility];  Service: Orthopedics;  Laterality: N/A;  . OPERATIVE HYSTEROSCOPY    . ORIF PATELLA Left 10/25/2014   Procedure: OPEN REDUCTION INTERNAL (ORIF) FIXATION LEFT PATELLA;  Surgeon: Marybelle Killings, MD;  Location: [facility];  Service: Orthopedics;  Laterality: Left;  . TEE WITHOUT CARDIOVERSION N/A 12/09/2015   Procedure: TRANSESOPHAGEAL ECHOCARDIOGRAM (TEE);  Surgeon: Jerline Pain, MD;  Location: [facility];  Service: Cardiovascular;  Laterality: N/A;  . WISDOM TOOTH EXTRACTION      There were no vitals filed for this visit.  Subjective Assessment - 08/14/16 1150    Subjective Pt denied falls since last visit. Pt reported she's a little tired today and feels slightly unsteady because of the fatigue (difficulty sleeping). Pt reported 0/10 dizziness on "the good days, and I've had a lot of them", and "I guess I haven't had anymore dizziness, just unsteady".   Pertinent History A-fib, HTN, hypo and hyperthyroidism related to thyroid nodules, Hx of CVA, osteoporosis, Hx of Lx laminectomy, Hx of L patella fx, chronic kidney disease, COPD-per chart but pt denied having COPD, aortic atherosclerosis   Patient Stated Goals "I would like [place] to find out what the problem is  and correct it". Improve balance when walking.                          [facility] Adult PT Treatment/Exercise - 08/14/16 1157      Ambulation/Gait   Ambulation/Gait Yes   Ambulation/Gait Assistance 6: Modified independent (Device/Increase time)   Ambulation/Gait Assistance Details Pt amb. over even/uneven terrain while performing head turns.   Ambulation Distance (Feet) 1000 Feet   Assistive device None   Gait Pattern Step-to pattern;Decreased stride length;Decreased trunk rotation   Ambulation Surface Level;Unlevel;Indoor;Outdoor;Paved;Grass     High Level Balance   High Level Balance Activities Turns   High Level Balance Comments Pt performed 360 degree turn without LOB and no dizziness. Performed with S to ensure safety.           Self Care:     PT Education - 08/14/16 1215    Education provided Yes   Education Details PT discussed the importance of continuing HEP to improve and maintain functional gains (balance related). PT discussed goal progress and d/c, as pt feels she has the tools necessary to improve balance now that dizziness has subsided.  PT discussed SOT and FGA findings again, and that pt is at medium risk for falls and needs to amb. with caution.   Person(s) Educated Patient   Methods Explanation   Comprehension Verbalized understanding          PT Short Term Goals - 07/13/16 1133      PT SHORT TERM GOAL #1   Title same as LTGs            PT Long Term Goals - 08/14/16 1238      PT LONG TERM GOAL #1   Title Pt will be IND in HEP to improve balance and dizziness in order to perform functional activities safely. Target date: 08/10/16   Baseline 8/8: performed HEP with mod I using paper handout   Status Achieved     PT LONG TERM GOAL #2   Title Pt will improve FGA score by >/=25/30 to decr. falls risk. Target date: 08/10/16   Baseline 8/8: FGA = 21/30   Status Partially Met     PT LONG TERM GOAL #3   Title Pt will amb. 1000' over  even/uneven terrain, while performing head turns, IND with reports of dizziness not incr. by >/= 2points to improve functional mobility. Target date; 08/10/16   Status Partially Met     PT LONG TERM GOAL #4   Title Pt will report dizziness does not surpass 2/10 during gait to improve safety during mobility. Target date: 08/10/16   Status Achieved     PT LONG TERM GOAL #5   Title Pt will perform 360 degree turns with no incr. in dizziness and no LOB in order to improve  functional mobility. Target date: 08/10/16   Status Achieved     PT LONG TERM GOAL #6   Title Pt will improve DGI score to >/=20/24 to decr. falls risk. Target date: 08/10/16   Baseline 8/8: FGA = 20/24   Status Achieved     PT LONG TERM GOAL #7   Title Improve composite score on SOT to >/= WNL's to demonstrate improved balance/vestibular function.  (08-18-16) goal established by Guido Sander, PT on 07-18-16   Baseline 8/8: SOT composite score = 62% (age/height norm = 64%)   Time 4   Period Weeks   Status Partially Met               Plan - 08/15/16 1217    Clinical Impression Statement Pt met LTGs 4 and 5 and partially met LTG 3 as she required reduced speed to amb. over grassy terrain. Pt denied dizziness during session. Pt would benefit from additional therapy to improve balance and vestibular input, however, pt is pleased with current function. Please see pt d/c summary for details.   Rehab Potential Good   Clinical Impairments Affecting Rehab Potential co-morbidities   PT Frequency 2x / week   PT Duration 4 weeks   PT Treatment/Interventions ADLs/Self Care Home Management;Biofeedback;Canalith Repostioning;Orthotic Fit/Training;Patient/family education;Neuromuscular re-education;Balance training;Therapeutic exercise;Therapeutic activities;Functional mobility training;Manual techniques;Vestibular;Stair training;Gait training;DME Instruction   PT Next Visit Plan d/c   PT Home Exercise Plan balance on foam and amb.  with head turns   Consulted and Agree with Plan of Care Patient      Patient will benefit from skilled therapeutic intervention in order to improve the following deficits and impairments:  Abnormal gait, Decreased knowledge of use of DME, Dizziness, Decreased mobility, Decreased balance, Postural dysfunction  Visit Diagnosis: Other abnormalities of gait and mobility - Plan: PT plan of care cert/re-cert  Dizziness and giddiness - Plan: PT plan of care cert/re-cert       G-Codes - 08/15/2016 1240    Functional Assessment Tool Used FGA: 21/30   Functional Limitation Mobility: Walking and moving around   Mobility: Walking and Moving Around Goal Status [phone]) At least 1 percent but less than 20 percent impaired, limited or restricted   Mobility: Walking and Moving Around Discharge Status [phone]) At least 1 percent but less than 20 percent impaired, limited or restricted      Problem List Patient Active Problem List   Diagnosis Date Noted  . Hypothyroidism 12/18/2015  . Multiple thyroid nodules 12/18/2015  . Essential hypertension, benign 11/15/2015  . Chronic leg pain 06/09/2015  . Aortic atherosclerosis ([facility]) 01/13/2015  . Left patella fracture 10/25/2014  . Encounter for therapeutic drug monitoring 02/19/2014  . Gallstone 03/08/2012  . Hemorrhagic cystitis 03/08/2012  . Leukocytosis 03/08/2012  . Hyponatremia 03/08/2012  . Hypochloremia 03/08/2012  . Hypokalemia 03/08/2012  . Benign hypertensive heart disease without heart failure 05/17/2011  . Dyslipidemia 05/17/2011  . History of embolic stroke   . Hyperthyroidism   . Long-term (current) use of anticoagulants   . Atrial fibrillation ([place]) 04/23/2011    Leinaala Catanese L 2016-08-15, 12:43 PM  [facility] [address] [place], [place], [address] Phone: [phone]   Fax:  [phone]  Name: Lisa Lucero MRN: [id number] Date of Birth:  02/19/1935  PHYSICAL THERAPY DISCHARGE SUMMARY  Visits from Start of Care: 8  Current functional level related to goals / functional outcomes:     PT Long Term Goals - 08/15/16 1238  PT LONG TERM GOAL #1   Title Pt will be IND in HEP to improve balance and dizziness in order to perform functional activities safely. Target date: 08/10/16   Baseline 8/8: performed HEP with mod I using paper handout   Status Achieved     PT LONG TERM GOAL #2   Title Pt will improve FGA score by >/=25/30 to decr. falls risk. Target date: 08/10/16   Baseline 8/8: FGA = 21/30   Status Partially Met     PT LONG TERM GOAL #3   Title Pt will amb. 1000' over even/uneven terrain, while performing head turns, IND with reports of dizziness not incr. by >/= 2points to improve functional mobility. Target date; 08/10/16   Status Partially Met     PT LONG TERM GOAL #4   Title Pt will report dizziness does not surpass 2/10 during gait to improve safety during mobility. Target date: 08/10/16   Status Achieved     PT LONG TERM GOAL #5   Title Pt will perform 360 degree turns with no incr. in dizziness and no LOB in order to improve functional mobility. Target date: 08/10/16   Status Achieved     PT LONG TERM GOAL #6   Title Pt will improve DGI score to >/=20/24 to decr. falls risk. Target date: 08/10/16   Baseline 8/8: FGA = 20/24   Status Achieved     PT LONG TERM GOAL #7   Title Improve composite score on SOT to >/= WNL's to demonstrate improved balance/vestibular function.  (08-18-16) goal established by Guido Sander, PT on 07-18-16   Baseline 8/8: SOT composite score = 62% (age/height norm = 64%)   Time 4   Period Weeks   Status Partially Met        Remaining deficits: Unsteadiness during head turns over uneven terrain   Education / Equipment: HEP  Plan: Patient agrees to discharge.  Patient goals were met. Patient is being discharged due to being pleased with the current functional level.  ?????        Geoffry Paradise, PT,DPT 08/14/16 12:43 PM Phone: [phone] Fax: [phone]

## 2016-08-15 ENCOUNTER — Other Ambulatory Visit: Payer: Self-pay | Admitting: Family Medicine

## 2016-08-16 ENCOUNTER — Encounter: Payer: Self-pay | Admitting: Cardiology

## 2016-08-18 ENCOUNTER — Other Ambulatory Visit: Payer: Self-pay | Admitting: Family Medicine

## 2016-08-21 ENCOUNTER — Other Ambulatory Visit: Payer: Self-pay | Admitting: Family Medicine

## 2016-08-21 NOTE — Telephone Encounter (Signed)
This was last filled 3/3 for #90 with 1 refill (to last 6 months).  She is currently on two different doses of diltiazem--one is prescribed by cardiology, one is prescribed by [place].  I'm sure this will add to the confusion for this patient who is already very confused.  She has an upcoming appointment with Dr. Marlou Porch (cardiology), and it would make the most sense to have both of these diltiazem prescriptions come from the same provider.  I don't think she should run out before her appt, but check with pt and explain why she should ask Dr. Marlou Porch for refill at her upcoming appt (or sooner, if needed)

## 2016-08-21 NOTE — Telephone Encounter (Signed)
Dr.Knapp is this okay to refill 

## 2016-08-22 ENCOUNTER — Other Ambulatory Visit: Payer: Self-pay

## 2016-08-22 MED ORDER — DILTIAZEM HCL ER COATED BEADS 120 MG PO CP24: 120.0000 mg | ORAL_CAPSULE | Freq: Every day | ORAL | 1 refills | Status: DC

## 2016-08-23 ENCOUNTER — Other Ambulatory Visit: Payer: Self-pay | Admitting: *Deleted

## 2016-08-23 MED ORDER — DILTIAZEM HCL ER COATED BEADS 120 MG PO CP24: 120.0000 mg | ORAL_CAPSULE | Freq: Every day | ORAL | 1 refills | Status: DC

## 2016-08-27 ENCOUNTER — Telehealth: Payer: Self-pay | Admitting: Family Medicine

## 2016-08-27 NOTE — Telephone Encounter (Signed)
Pt says her bp is usually high then she takes her bp med and she bp drops low. Pt wants to know if Dr Tomi Bamberger is willing to adjust bp med or does pt need to come in for appt?   This morning bp was 148/98 before she took med then bp went down to 118/72 after taking bp med

## 2016-08-27 NOTE — Telephone Encounter (Signed)
She has appt with Dr. Marlou Porch on 8/30. She should bring her list of BP's to her visit with him and see if he can address. Also, be sure to remind her that Dr. Marlou Porch should be writing for her BP meds--currently she has two different doses of her calcium channel blocker, and we have been prescribing one, he prescribes the other, which doesn't really make sense.  Hopefully he can write for both of them (they are for her atrial fibrillation rate control, and also control blood pressure).

## 2016-08-28 NOTE — Telephone Encounter (Signed)
Called pt and gave her Dr Johnsie Kindred instructions. She understood.

## 2016-08-29 ENCOUNTER — Ambulatory Visit (INDEPENDENT_AMBULATORY_CARE_PROVIDER_SITE_OTHER): Payer: Medicare Other | Admitting: *Deleted

## 2016-08-29 ENCOUNTER — Ambulatory Visit (INDEPENDENT_AMBULATORY_CARE_PROVIDER_SITE_OTHER): Payer: Medicare Other | Admitting: Cardiology

## 2016-08-29 ENCOUNTER — Encounter: Payer: Self-pay | Admitting: Cardiology

## 2016-08-29 VITALS — BP 144/88 | HR 62 | Ht 66.0 in | Wt 122.4 lb

## 2016-08-29 DIAGNOSIS — I4891 Unspecified atrial fibrillation: Secondary | ICD-10-CM | POA: Diagnosis not present

## 2016-08-29 DIAGNOSIS — E7800 Pure hypercholesterolemia, unspecified: Secondary | ICD-10-CM | POA: Diagnosis not present

## 2016-08-29 DIAGNOSIS — Z5181 Encounter for therapeutic drug level monitoring: Secondary | ICD-10-CM

## 2016-08-29 DIAGNOSIS — I119 Hypertensive heart disease without heart failure: Secondary | ICD-10-CM

## 2016-08-29 DIAGNOSIS — I480 Paroxysmal atrial fibrillation: Secondary | ICD-10-CM

## 2016-08-29 LAB — POCT INR: INR: 4.3

## 2016-08-29 MED ORDER — METOPROLOL TARTRATE 25 MG PO TABS: 25.0000 mg | ORAL_TABLET | Freq: Two times a day (BID) | ORAL | 3 refills | Status: AC

## 2016-08-29 NOTE — Patient Instructions (Signed)
Medication Instructions:  Please decrease your Metoprolol to 25 mg twice a day. Continue all other medications as listed.  Follow-Up: Follow up in 6 months with Truitt Merle, NP.  You will receive a letter in the mail 2 months before you are due.  Please call us when you receive this letter to schedule your follow up appointment.  Follow up in 1 year with Dr. Marlou Porch.  You will receive a letter in the mail 2 months before you are due.  Please call us when you receive this letter to schedule your follow up appointment.  If you need a refill on your cardiac medications before your next appointment, please call your pharmacy.  Thank you for choosing [facility]!!

## 2016-08-29 NOTE — Progress Notes (Signed)
Cardiology Office Note    Date:  08/29/2016   ID:  Lisa Lucero, DOB 19-Feb-1935, MRN [id number]  PCP:  Vikki Ports, MD  Cardiologist:   Candee Furbish, MD     History of Present Illness:  Lisa Lucero is a 81 y.o. female former patient of Dr. Sherryl Barters here for office follow-up  History of paroxysmal atrial fibrillation last hospitalized in March 2010 on long-term Coumadin for anticoagulation. Previously, she had gone off of Coumadin and was maintaining sinus rhythm when she was hospitalized with embolic stroke and then placed back on long-term Coumadin. In October 2011 she was admitted with a GI bleed secondary to erosive duodenitis. Since. He, has been doing well.  She has had back surgery by Dr. Inda Merlin as well as patellar fracture surgery in 2015.  In December 2016 she went back into atrial fibrillation and underwent a TEE cardioversion by myself which was successful. She feels better in sinus rhythm. Decreased exertional dyspnea.  In July 2017 she underwent thyroid biopsy and had Lovenox bridge because of her prior history of stroke when coming off of Coumadin.  Sometimes she is feeling tired during the day, no energy. She wonders if she may be taking too much medication. We discussed both the diltiazem and the metoprolol. On 08/29/16 but decided to decrease her metoprolol from 50 twice a day down to 25 twice a day. Periodically she will have an EKG in our system showing heart rate of 52 bpm. Sometimes at home her blood pressure monitor will show heart rates in the upper 40s.  Past Medical History:  Diagnosis Date  . Anemia    Iron deficiency anemia  . Arthritis   . Arthropathy   . Back pain   . Blood in stool   . Chronic kidney disease   . COPD (chronic obstructive pulmonary disease) ([place])    pt denies on 09/27/14  . CVA (cerebral vascular accident) ([place])   . Erosive gastritis    with GI bleed thought due to elevated INR and duodenitis  . External hemorrhoids without  complication   . GERD (gastroesophageal reflux disease)   . H/O hypercholesterolemia    PMH: only  . H/O transfusion of packed red blood cells   . H/O: GI bleed   . Hemorrhage of rectum and anus   . History of embolic stroke Q000111Q  . HTN (hypertension)   . Hyperthyroidism   . Hypothyroidism   . Hypothyroidism   . Insomnia    prev tried Trazadone (2014); ambien CR caused hangover; on ambien since 11/2013  . Iron deficiency anemia due to chronic blood loss   . Long-term (current) use of anticoagulants   . Osteoarthritis of both knees   . Osteoarthritis of lumbar spine   . Osteoporosis   . Paroxysmal atrial fibrillation (HCC)   . Peripheral edema   . Postmenopausal   . Primary osteoarthritis of both hips   . Pyuria   . Stroke [facility])     Past Surgical History:  Procedure Laterality Date  . ABDOMINAL HYSTERECTOMY    . BREAST ENHANCEMENT SURGERY    . CARDIOVERSION N/A 12/09/2015   Procedure: CARDIOVERSION;  Surgeon: Jerline Pain, MD;  Location: [place];  Service: Cardiovascular;  Laterality: N/A;  . CHOLECYSTECTOMY  03/11/2012   Procedure: LAPAROSCOPIC CHOLECYSTECTOMY WITH INTRAOPERATIVE CHOLANGIOGRAM;  Surgeon: Joyice Faster. [place], MD;  Location: [place];  Service: General;  Laterality: N/A;  . CHOLECYSTECTOMY    . COLONOSCOPY    .  CYST EXCISION  11/2014   left chest wall--nodular hidroadenoma  . HEMORRHOID SURGERY    . LUMBAR LAMINECTOMY N/A 09/29/2014   Procedure: L4-5 Decompression, Hemi-Laminectomy,  Removal Free Fragment;  Surgeon: Marybelle Killings, MD;  Location: [place];  Service: Orthopedics;  Laterality: N/A;  . OPERATIVE HYSTEROSCOPY    . ORIF PATELLA Left 10/25/2014   Procedure: OPEN REDUCTION INTERNAL (ORIF) FIXATION LEFT PATELLA;  Surgeon: Marybelle Killings, MD;  Location: [place];  Service: Orthopedics;  Laterality: Left;  . TEE WITHOUT CARDIOVERSION N/A 12/09/2015   Procedure: TRANSESOPHAGEAL ECHOCARDIOGRAM (TEE);  Surgeon: Jerline Pain, MD;  Location: [facility] ENDOSCOPY;   Service: Cardiovascular;  Laterality: N/A;  . WISDOM TOOTH EXTRACTION      Current Medications: Outpatient Medications Prior to Visit  Medication Sig Dispense Refill  . calcium carbonate (TUMS - DOSED IN MG ELEMENTAL CALCIUM) 500 MG chewable tablet Chew 1 tablet by mouth 2 (two) times daily.    Marland Kitchen diltiazem (CARDIZEM CD) 120 MG 24 hr capsule Take 1 capsule (120 mg total) by mouth daily. 90 capsule 1  . diltiazem (DILACOR XR) 240 MG 24 hr capsule Take 240 mg by mouth every morning.     Marland Kitchen FERREX 150 150 MG capsule TAKE ONE CAPSULE BY MOUTH EVERY DAY 30 capsule 1  . hydrochlorothiazide (HYDRODIURIL) 25 MG tablet Take 0.5 tablets (12.5 mg total) by mouth daily. 45 tablet 1  . hydrocortisone (ANUSOL-HC) 25 MG suppository Reported on 05/09/2016 12 suppository 0  . KLOR-CON M10 10 MEQ tablet TAKE 1 TABLET TWICE A DAY 180 tablet 1  . levothyroxine (SYNTHROID, LEVOTHROID) 25 MCG tablet TAKE 1 TABLET DAILY BEFORE BREAKFAST 90 tablet 1  . lisinopril (PRINIVIL,ZESTRIL) 20 MG tablet TAKE 1 TABLET TWICE A DAY 180 tablet 1  . Magnesium 400 MG CAPS Take 1 tablet by mouth daily.    . meclizine (ANTIVERT) 12.5 MG tablet Take 1 tablet (12.5 mg total) by mouth 3 (three) times daily as needed for dizziness. 30 tablet 0  . Multiple Vitamin (MULITIVITAMIN WITH MINERALS) TABS Take 1 tablet by mouth daily.    . pantoprazole (PROTONIX) 40 MG tablet TAKE 1 TABLET DAILY 90 tablet 1  . pravastatin (PRAVACHOL) 20 MG tablet TAKE 1 TABLET (20 MG TOTAL) BY MOUTH EVERY EVENING. 30 tablet 6  . PREMARIN vaginal cream USE 1 GRAM VAGINALLY AT BEDTIME 2 TIMES A WEEK  6  . warfarin (COUMADIN) 5 MG tablet TAKE AS DIRECTED BY COUMADIN CLINIC 80 tablet 1  . zolpidem (AMBIEN CR) 6.25 MG CR tablet Take 1 tablet (6.25 mg total) by mouth at bedtime. 90 tablet 0  . enoxaparin (LOVENOX) 80 MG/0.8ML injection Inject 0.8 mLs (80 mg total) into the skin daily. 10 Syringe 1  . gabapentin (NEURONTIN) 100 MG capsule TAKE 2 CAPSULES TWICE A DAY 360  capsule 1  . metoprolol (LOPRESSOR) 50 MG tablet TAKE 1 TABLET TWICE A DAY 180 tablet 1  . triamcinolone cream (KENALOG) 0.1 % Apply 1 application topically 2 (two) times daily. 30 g 0   No facility-administered medications prior to visit.      Allergies:   Celebrex [celecoxib] and Lipitor [atorvastatin calcium]   Social History   Social History  . Marital status: Married    Spouse name: N/A  . Number of children: N/A  . Years of education: N/A   Social History Main Topics  . Smoking status: Former Smoker    Types: Cigarettes    Quit date: 09/30/2010  . Smokeless tobacco:  Never Used  . Alcohol use No  . Drug use: No  . Sexual activity: Not Currently   Other Topics Concern  . None   Social History Narrative   Married, lives with husband.  2 children--one in [place] and one in [place] (daughters)     Family History:  The patient's family history includes Cancer in her brother; Dementia in her father; Stroke in her mother.   ROS:   Please see the history of present illness.   No bleeding, no syncope, no orthopnea, no PND ROS All other systems reviewed and are negative.   PHYSICAL EXAM:   VS:  BP (!) 144/88   Pulse 62   Ht 5\' 6"  (1.676 m)   Wt 122 lb 6.4 oz (55.5 kg)   BMI 19.76 kg/m    [id number] in no acute distress  HEENT: normal  Neck: no JVD, carotid bruits, or masses Cardiac: RRR; no murmurs, rubs, or gallops,no edema, Diminished peripheral pulses however normal ABIs/arterial Dopplers  Respiratory:  clear to auscultation bilaterally, normal work of breathing GI: soft, nontender, nondistended, + BS MS: no deformity or atrophy  Skin: warm and dry, no rash Neuro:  Alert and Oriented x 3, Strength and sensation are intact Psych: euthymic mood, full affect  Wt Readings from Last 3 Encounters:  08/29/16 122 lb 6.4 oz (55.5 kg)  06/27/16 123 lb (55.8 kg)  05/14/16 125 lb 3.2 oz (56.8 kg)      Studies/Labs Reviewed:   EKG:  None today  Recent  Labs: 05/17/2016: ALT 12; Hemoglobin 12.7; Platelets 193; TSH 1.14 06/27/2016: BUN 25; Creat 1.11; Potassium 4.1; Sodium 137   Lipid Panel    Component Value Date/Time   CHOL 188 05/17/2016 0750   TRIG 110 05/17/2016 0750   HDL 56 05/17/2016 0750   CHOLHDL 3.4 05/17/2016 0750   VLDL 22 05/17/2016 0750   LDLCALC 110 05/17/2016 0750    Additional studies/ records that were reviewed today include:  Prior office notes reviewed, lab work reviewed, EKG reviewed  Transesophageal echocardiogram 12/09/15:  -Normal ejection fraction.  ASSESSMENT:    1. PAF (paroxysmal atrial fibrillation) ([place])   2. Encounter for therapeutic drug monitoring   3. Hypercholesterolemia   4. Benign hypertensive heart disease without heart failure      PLAN:  In order of problems listed above:  Paroxysmal atrial fibrillation  - Last cardioversion in December 2016, single shock with restoration of sinus rhythm.  - Symptomatic atrial fibrillation in the past but she states that sometimes she does not truly feel her atrial fibrillation.  Fatigue  - Since she periodically will have bradycardia, I will decrease her metoprolol from 50 mg twice a day down to 25 mg twice a day to see if this helps with her fatigue. At a later date, we may wish to discontinue the metoprolol. Continue with diltiazem for rate control. She is splitting 240 mg in the morning and 120 mg in the evening.  Chronic anticoagulation  - On Coumadin therapy.  - Use Lovenox bridge for procedures.  - Has history of embolic stroke off of Coumadin.  History of stroke  - This occurred when she was in sinus rhythm off of Coumadin.  Hypertensive heart disease without heart failure  - Doing well, overall well controlled. Medications reviewed. Reducing metoprolol. She monitors at home. Sometimes her home monitor does not pick up.  Decreased peripheral pulses  - Arterial Dopplers showed normal circulation to lower external knees bilaterally in  06/10/15.  Leg pain result of back pain.     Medication Adjustments/Labs and Tests Ordered: Current medicines are reviewed at length with the patient today.  Concerns regarding medicines are outlined above.  Medication changes, Labs and Tests ordered today are listed in the Patient Instructions below. Patient Instructions  Medication Instructions:  Please decrease your Metoprolol to 25 mg twice a day. Continue all other medications as listed.  Follow-Up: Follow up in 6 months with Truitt Merle, NP.  You will receive a letter in the mail 2 months before you are due.  Please call us when you receive this letter to schedule your follow up appointment.  Follow up in 1 year with Dr. Marlou Porch.  You will receive a letter in the mail 2 months before you are due.  Please call us when you receive this letter to schedule your follow up appointment.  If you need a refill on your cardiac medications before your next appointment, please call your pharmacy.  Thank you for choosing [facility]!!        Signed, Candee Furbish, MD  08/29/2016 10:00 AM    [address], [place], [place]  [address] Phone: [phone]; Fax: [phone]

## 2016-09-11 ENCOUNTER — Ambulatory Visit (INDEPENDENT_AMBULATORY_CARE_PROVIDER_SITE_OTHER): Payer: Medicare Other | Admitting: *Deleted

## 2016-09-11 DIAGNOSIS — Z5181 Encounter for therapeutic drug level monitoring: Secondary | ICD-10-CM | POA: Diagnosis not present

## 2016-09-11 DIAGNOSIS — I4891 Unspecified atrial fibrillation: Secondary | ICD-10-CM | POA: Diagnosis not present

## 2016-09-11 LAB — POCT INR: INR: 2.2

## 2016-09-14 ENCOUNTER — Other Ambulatory Visit: Payer: Self-pay | Admitting: Family Medicine

## 2016-09-17 ENCOUNTER — Telehealth: Payer: Self-pay | Admitting: Family Medicine

## 2016-09-17 NOTE — Telephone Encounter (Signed)
done

## 2016-09-17 NOTE — Telephone Encounter (Signed)
Is this okay to refill? 

## 2016-09-17 NOTE — Telephone Encounter (Signed)
Pt called and said pharmacy told her to call us this morning and tell us she was going to go pick up Zolpidem tartate at the pharmacy. I reviewed chart and explained the request came in after 5:00 on Friday and Dr. Tomi Bamberger hasn't seen it yet.  She said she was going to go pick it up.  I explained it was not ready and she said pharmacy said it was ok.  I tried to explain Dr. Tomi Bamberger has to say if ok.  She asked to speak with [place].

## 2016-09-25 ENCOUNTER — Ambulatory Visit (INDEPENDENT_AMBULATORY_CARE_PROVIDER_SITE_OTHER): Payer: Medicare Other | Admitting: Gastroenterology

## 2016-09-25 ENCOUNTER — Encounter: Payer: Self-pay | Admitting: Gastroenterology

## 2016-09-25 VITALS — BP 126/76 | HR 68 | Ht 66.0 in | Wt 123.4 lb

## 2016-09-25 DIAGNOSIS — K642 Third degree hemorrhoids: Secondary | ICD-10-CM

## 2016-09-25 DIAGNOSIS — K5900 Constipation, unspecified: Secondary | ICD-10-CM

## 2016-09-25 NOTE — Progress Notes (Signed)
Lisa Lucero    [id number]    28-Sep-1935  Primary Care Physician:KNAPP,Lisa Lucero  Referring Physician: Rita Ohara, Lucero [address] [place], [place] [address]  Chief complaint:  Constipation, hemorrhoids  HPI: 81 year old female previously followed by [facility] Lucero [facility] here to establish care. Patient complains of intermittent rectal pain associated with hemorrhoids and she also has intermittent small volume bright red blood per rectum. She has history of chronic constipation with irregular bowel movements, her constipation does improve with MiraLAX but she forgets to take it on regular basis. She does report straining excessively with bowel movements with hard stool.  EGD and colonoscopy in 2011 unremarkable mild erosive duodenitis.   Outpatient Encounter Prescriptions as of 09/25/2016  Medication Sig  . calcium carbonate (TUMS - DOSED IN MG ELEMENTAL CALCIUM) 500 MG chewable tablet Chew 1 tablet by mouth 2 (two) times daily.  Marland Kitchen diltiazem (CARDIZEM CD) 120 MG 24 hr capsule Take 1 capsule (120 mg total) by mouth daily.  Marland Kitchen diltiazem (DILACOR XR) 240 MG 24 hr capsule Take 240 mg by mouth every morning.   Marland Kitchen FERREX 150 150 MG capsule TAKE ONE CAPSULE BY MOUTH EVERY DAY  . gabapentin (NEURONTIN) 100 MG capsule Take 200 mg by mouth 2 (two) times daily.   . hydrochlorothiazide (HYDRODIURIL) 25 MG tablet Take 0.5 tablets (12.5 mg total) by mouth daily.  . hydrocortisone (ANUSOL-HC) 25 MG suppository Reported on 05/09/2016  . KLOR-CON M10 10 MEQ tablet TAKE 1 TABLET TWICE A DAY  . levothyroxine (SYNTHROID, LEVOTHROID) 25 MCG tablet TAKE 1 TABLET DAILY BEFORE BREAKFAST  . lisinopril (PRINIVIL,ZESTRIL) 20 MG tablet TAKE 1 TABLET TWICE A DAY  . Magnesium 400 MG CAPS Take 1 tablet by mouth daily.  . meclizine (ANTIVERT) 12.5 MG tablet Take 1 tablet (12.5 mg total) by mouth 3 (three) times daily as needed for dizziness.  . metoprolol (LOPRESSOR) 25 MG tablet Take 1 tablet (25 mg total) by  mouth 2 (two) times daily.  . Multiple Vitamin (MULITIVITAMIN WITH MINERALS) TABS Take 1 tablet by mouth daily.  . pantoprazole (PROTONIX) 40 MG tablet TAKE 1 TABLET DAILY  . pravastatin (PRAVACHOL) 20 MG tablet TAKE 1 TABLET (20 MG TOTAL) BY MOUTH EVERY EVENING.  Marland Kitchen PREMARIN vaginal cream USE 1 GRAM VAGINALLY AT BEDTIME 2 TIMES A WEEK  . warfarin (COUMADIN) 5 MG tablet TAKE AS DIRECTED BY COUMADIN CLINIC  . zolpidem (AMBIEN CR) 6.25 MG CR tablet TAKE 1 TABLET BY MOUTH EVERY NIGHT   No facility-administered encounter medications on file as of 09/25/2016.     Allergies as of 09/25/2016 - Review Complete 09/25/2016  Allergen Reaction Noted  . Celebrex [celecoxib] Diarrhea 10/22/2014  . Lipitor [atorvastatin calcium] Other (See Comments) 05/17/2011    Past Medical History:  Diagnosis Date  . Anemia    Iron deficiency anemia  . Arthritis   . Arthropathy   . Back pain   . Blood in stool   . Chronic kidney disease   . COPD (chronic obstructive pulmonary disease) ([place])    pt denies on 09/27/14  . CVA (cerebral vascular accident) ([place])   . Erosive gastritis    with GI bleed thought due to elevated INR and duodenitis  . External hemorrhoids without complication   . GERD (gastroesophageal reflux disease)   . H/O hypercholesterolemia    PMH: only  . H/O transfusion of packed red blood cells   . H/O: GI bleed   .  Hemorrhage of rectum and anus   . History of embolic stroke Q000111Q  . HTN (hypertension)   . Hyperthyroidism   . Hypothyroidism   . Hypothyroidism   . Insomnia    prev tried Trazadone (2014); ambien CR caused hangover; on ambien since 11/2013  . Iron deficiency anemia due to chronic blood loss   . Long-term (current) use of anticoagulants   . Osteoarthritis of both knees   . Osteoarthritis of lumbar spine   . Osteoporosis   . Paroxysmal atrial fibrillation (HCC)   . Peripheral edema   . Postmenopausal   . Primary osteoarthritis of both hips   . Pyuria   . Stroke [facility])      Past Surgical History:  Procedure Laterality Date  . ABDOMINAL HYSTERECTOMY    . BREAST ENHANCEMENT SURGERY    . CARDIOVERSION N/A 12/09/2015   Procedure: CARDIOVERSION;  Surgeon: Jerline Pain, Lucero;  Location: [place];  Service: Cardiovascular;  Laterality: N/A;  . CHOLECYSTECTOMY  03/11/2012   Procedure: LAPAROSCOPIC CHOLECYSTECTOMY WITH INTRAOPERATIVE CHOLANGIOGRAM;  Surgeon: Joyice Faster. Corn[place], Lucero;  Location: [place];  Service: General;  Laterality: N/A;  . CHOLECYSTECTOMY    . COLONOSCOPY    . CYST EXCISION  11/2014   left chest wall--nodular hidroadenoma  . HEMORRHOID SURGERY    . LUMBAR LAMINECTOMY N/A 09/29/2014   Procedure: L4-5 Decompression, Hemi-Laminectomy,  Removal Free Fragment;  Surgeon: Marybelle Killings, Lucero;  Location: [place];  Service: Orthopedics;  Laterality: N/A;  . OPERATIVE HYSTEROSCOPY    . ORIF PATELLA Left 10/25/2014   Procedure: OPEN REDUCTION INTERNAL (ORIF) FIXATION LEFT PATELLA;  Surgeon: Marybelle Killings, Lucero;  Location: [place];  Service: Orthopedics;  Laterality: Left;  . TEE WITHOUT CARDIOVERSION N/A 12/09/2015   Procedure: TRANSESOPHAGEAL ECHOCARDIOGRAM (TEE);  Surgeon: Jerline Pain, Lucero;  Location[facility] ENDOSCOPY;  Service: Cardiovascular;  Laterality: N/A;  . WISDOM TOOTH EXTRACTION      Family History  Problem Relation Age of Onset  . Stroke Mother   . Dementia Father   . Hypertension    . Arthritis    . Cancer Brother     lung    Social History   Social History  . Marital status: Married    Spouse name: N/A  . Number of children: N/A  . Years of education: N/A   Occupational History  . Not on file.   Social History Main Topics  . Smoking status: Former Smoker    Types: Cigarettes    Quit date: 09/30/2010  . Smokeless tobacco: Never Used  . Alcohol use No  . Drug use: No  . Sexual activity: Not Currently   Other Topics Concern  . Not on file   Social History Narrative   Married, lives with husband.  2 children--one in [place] and  one in [place] (daughters)      Review of systems: Review of Systems  Constitutional: Negative for fever and chills.  HENT: Negative.   Eyes: Negative for blurred vision.  Respiratory: Negative for cough, shortness of breath and wheezing.   Cardiovascular: Negative for chest pain and palpitations.  Gastrointestinal: as per HPI Genitourinary: Negative for dysuria, urgency, frequency and hematuria.  Musculoskeletal: Negative for myalgias, back pain and positive joint pain.  Skin: Negative for itching and rash.  Neurological: Negative for dizziness, tremors, focal weakness, seizures and loss of consciousness.  Endo/Heme/Allergies: Positive for seasonal allergies.  Psychiatric/Behavioral: Negative for depression, suicidal ideas and hallucinations.  All other systems reviewed  and are negative.   Physical Exam: Vitals:   09/25/16 1433  BP: 126/76  Pulse: 68   Body mass index is 19.92 kg/m. Gen:      No acute distress HEENT:  EOMI, sclera anicteric Neck:     No masses; no thyromegaly Lungs:    Clear to auscultation bilaterally; normal respiratory effort CV:         Regular rate and rhythm; no murmurs Abd:      + bowel sounds; soft, non-tender; no palpable masses, no distension Ext:    No edema; adequate peripheral perfusion Skin:      Warm and dry; no rash Neuro: alert and oriented x 3 Psych: normal mood and affect Rectal exam: Normal anal sphincter tone, no anal fissure or external hemorrhoids Anoscopy: Grade 2 internal hemorrhoids, no active bleeding, normal dentate line, no visible nodules  Data Reviewed:  Reviewed labs, radiology imaging, old records and pertinent past GI work up   Assessment and Plan/Recommendations:  81 year old female with chronic constipation and internal hemorrhoids here to establish care Small-volume bright red blood per rectum likely secondary to internal hemorrhoids We will start Linzess 16mcg daily and titrate based on  response Instructed patient to avoid excessive staining on toilet and high fiber diet with increase in fluid intake Will schedule for hemorrhoidal band ligation if continues to have persistent symptoms from hemorrhoids Return in 3 months or sooner if needed   30 minutes was spent face-to-face with the patient. Greater than 50% of the time used for counseling as well as treatment plan and follow-up. She had multiple questions which were answered to her satisfaction  Damaris Hippo , [place] [phone] Mon-Fri 8a-5p [phone] after 5p, weekends, holidays  CC: Lisa Ohara, [place]

## 2016-09-25 NOTE — Patient Instructions (Addendum)
We will obtain your records from Dr Watt Climes to determine when your next colonoscopy is due  We have given you samples of Linzess to try, If they work for you we will send you in a prescription  Follow up with Dr Silverio Decamp on 12/20/2016 at 3pm

## 2016-10-01 ENCOUNTER — Other Ambulatory Visit: Payer: Self-pay

## 2016-10-01 ENCOUNTER — Telehealth: Payer: Self-pay | Admitting: Gastroenterology

## 2016-10-01 MED ORDER — HYDROCORTISONE ACETATE 25 MG RE SUPP: 25.0000 mg | Freq: Every day | RECTAL | 0 refills | Status: DC

## 2016-10-01 MED ORDER — DILTIAZEM HCL ER 240 MG PO CP24: 240.0000 mg | ORAL_CAPSULE | Freq: Every morning | ORAL | 3 refills | Status: DC

## 2016-10-01 NOTE — Telephone Encounter (Signed)
Spoke with the patient. She has a burning uncomfortable sensation. Has used Prep-H Multi sx and the one with Lidocaine. No bleeding noted. No pain with bowel movement. She says she can usually make things better with OTC, but not this time. She asks for Rx strength hemorrhoidal treatment.

## 2016-10-01 NOTE — Telephone Encounter (Signed)
Patient is advised.  

## 2016-10-01 NOTE — Telephone Encounter (Signed)
Please send patient Hydrocortisone suppository daily at bedtime X7-10 days. Benefiber 1 table TID with meals. Thanks

## 2016-10-02 ENCOUNTER — Ambulatory Visit (INDEPENDENT_AMBULATORY_CARE_PROVIDER_SITE_OTHER): Payer: Medicare Other

## 2016-10-02 ENCOUNTER — Telehealth: Payer: Self-pay | Admitting: *Deleted

## 2016-10-02 ENCOUNTER — Other Ambulatory Visit: Payer: Self-pay

## 2016-10-02 DIAGNOSIS — I4891 Unspecified atrial fibrillation: Secondary | ICD-10-CM | POA: Diagnosis not present

## 2016-10-02 DIAGNOSIS — Z5181 Encounter for therapeutic drug level monitoring: Secondary | ICD-10-CM | POA: Diagnosis not present

## 2016-10-02 LAB — POCT INR: INR: 3.6

## 2016-10-02 MED ORDER — HYDROCORTISONE ACETATE 25 MG RE SUPP: 25.0000 mg | Freq: Every day | RECTAL | 0 refills | Status: DC

## 2016-10-02 MED ORDER — LINACLOTIDE 72 MCG PO CAPS: 72.0000 ug | ORAL_CAPSULE | Freq: Every day | ORAL | 3 refills | Status: DC

## 2016-10-02 NOTE — Telephone Encounter (Signed)
Tried samples and they work wants a new prescription sent in

## 2016-10-08 ENCOUNTER — Telehealth: Payer: Self-pay | Admitting: Cardiology

## 2016-10-08 NOTE — Telephone Encounter (Signed)
New Message:    Her heart is out of rhythm

## 2016-10-08 NOTE — Telephone Encounter (Signed)
Spoke with pt who is reporting she felt fatigued and checked her BP.  She has determined she is out of rhythm because her HR is ranging from 50's to 110 bpm.  BP is 128/92 at this time.  She is taking Lopressor 25 mg BID, Diltiazem 240 mg Q AM and 120 mg Q PM for rate control.  She is reporting other than fatigue she wouldn't have known she is in At Fib.  She is wanting to know what to do.  She is on coumadin and also Lisinopril 20 mg a day.  Advised I will review with Dr Marlou Porch and c/b with any changes in medications.  She states understanding.

## 2016-10-10 ENCOUNTER — Encounter: Payer: Self-pay | Admitting: Cardiology

## 2016-10-10 ENCOUNTER — Other Ambulatory Visit: Payer: Self-pay | Admitting: *Deleted

## 2016-10-10 MED ORDER — DILTIAZEM HCL ER COATED BEADS 120 MG PO CP24: 120.0000 mg | ORAL_CAPSULE | Freq: Every day | ORAL | 1 refills | Status: DC

## 2016-10-10 NOTE — Telephone Encounter (Signed)
Reviewed with pt.  Appt scheduled for her to be seen by Cecilie Kicks 10/11/16.

## 2016-10-10 NOTE — Telephone Encounter (Signed)
Please have her see a team APP. Thanks Candee Furbish, MD

## 2016-10-11 ENCOUNTER — Ambulatory Visit (INDEPENDENT_AMBULATORY_CARE_PROVIDER_SITE_OTHER): Payer: Medicare Other | Admitting: Cardiology

## 2016-10-11 ENCOUNTER — Encounter: Payer: Self-pay | Admitting: Cardiology

## 2016-10-11 VITALS — BP 120/80 | HR 91 | Ht 66.0 in | Wt 125.4 lb

## 2016-10-11 DIAGNOSIS — I481 Persistent atrial fibrillation: Secondary | ICD-10-CM

## 2016-10-11 DIAGNOSIS — I10 Essential (primary) hypertension: Secondary | ICD-10-CM | POA: Diagnosis not present

## 2016-10-11 DIAGNOSIS — Z7901 Long term (current) use of anticoagulants: Secondary | ICD-10-CM

## 2016-10-11 DIAGNOSIS — Z5181 Encounter for therapeutic drug level monitoring: Secondary | ICD-10-CM

## 2016-10-11 DIAGNOSIS — E039 Hypothyroidism, unspecified: Secondary | ICD-10-CM

## 2016-10-11 DIAGNOSIS — I4891 Unspecified atrial fibrillation: Secondary | ICD-10-CM | POA: Diagnosis not present

## 2016-10-11 DIAGNOSIS — I4819 Other persistent atrial fibrillation: Secondary | ICD-10-CM

## 2016-10-11 LAB — CBC WITH DIFFERENTIAL/PLATELET
BASOS ABS: 0 {cells}/uL (ref 0–200)
Basophils Relative: 0 %
EOS ABS: 370 {cells}/uL (ref 15–500)
EOS PCT: 5 %
HCT: 37.6 % (ref 35.0–45.0)
Hemoglobin: 12.5 g/dL (ref 11.7–15.5)
LYMPHS PCT: 22 %
Lymphs Abs: 1628 cells/uL (ref 850–3900)
MCH: 31.1 pg (ref 27.0–33.0)
MCHC: 33.2 g/dL (ref 32.0–36.0)
MCV: 93.5 fL (ref 80.0–100.0)
MONOS PCT: 7 %
MPV: 9.2 fL (ref 7.5–12.5)
Monocytes Absolute: 518 cells/uL (ref 200–950)
NEUTROS ABS: 4884 {cells}/uL (ref 1500–7800)
NEUTROS PCT: 66 %
PLATELETS: 247 10*3/uL (ref 140–400)
RBC: 4.02 MIL/uL (ref 3.80–5.10)
RDW: 14.1 % (ref 11.0–15.0)
WBC: 7.4 10*3/uL (ref 3.8–10.8)

## 2016-10-11 LAB — BASIC METABOLIC PANEL
BUN: 17 mg/dL (ref 7–25)
CALCIUM: 9.3 mg/dL (ref 8.6–10.4)
CO2: 25 mmol/L (ref 20–31)
CREATININE: 1.18 mg/dL — AB (ref 0.60–0.93)
Chloride: 95 mmol/L — ABNORMAL LOW (ref 98–110)
GLUCOSE: 81 mg/dL (ref 65–99)
Potassium: 3.9 mmol/L (ref 3.5–5.3)
SODIUM: 131 mmol/L — AB (ref 135–146)

## 2016-10-11 LAB — TSH: TSH: 1 m[IU]/L

## 2016-10-11 MED ORDER — FLECAINIDE ACETATE 50 MG PO TABS: 50.0000 mg | ORAL_TABLET | Freq: Two times a day (BID) | ORAL | 1 refills | Status: DC

## 2016-10-11 NOTE — Progress Notes (Signed)
Cardiology Office Note   Date:  10/11/2016   ID:  [name], DOB [birth year], MRN [id number]  PCP:  Vikki Ports, MD  Cardiologist:  Dr. Marlou Porch     Chief Complaint  Patient presents with  . Fatigue    believes she is in A fbi      History of Present Illness: Lisa Lucero is a 81 y.o. female who presents for persistent a fib. She has hx of a fib and last episode was Dec 2016.   History of paroxysmal atrial fibrillation last hospitalized in March 2010 on long-term Coumadin for anticoagulation. Previously, she had gone off of Coumadin and was maintaining sinus rhythm when she was hospitalized with embolic stroke and then placed back on long-term Coumadin. In October 2011 she was admitted with a GI bleed secondary to erosive duodenitis. Since. He, has been doing well.  She has had back surgery by Dr. Inda Merlin as well as patellar fracture surgery in 2015.  In December 2016 she went back into atrial fibrillation and underwent a TEE cardioversion by Dr. Marlou Porch which was successful. She feels better in sinus rhythm. Decreased exertional dyspnea.  In July 2017 she underwent thyroid biopsy and had Lovenox bridge because of her prior history of stroke when coming off of Coumadin.  On 08/29/16 but decided to decrease her metoprolol from 50 twice a day down to 25 twice a day for fatigue. Periodically she will have an EKG in our system showing heart rate of 52 bpm. Sometimes at home her blood pressure monitor will show heart rates upper 40s.  Today she presents after calling in with extreme fatigue.   She thought she was in a fib and today on EKG she is.  Rate currently controlled at 91.  No acute EKG changes otherwise.  At home her HR is 110.  No chest pain and no SOB.  She has a chronic dizziness may be a little increased.  Past Medical History:  Diagnosis Date  . Anemia    Iron deficiency anemia  . Arthritis   . Arthropathy   . Back pain   . Blood in stool   . Chronic kidney  disease   . COPD (chronic obstructive pulmonary disease) ([place])    pt denies on 09/27/14  . CVA (cerebral vascular accident) ([place])   . Erosive gastritis    with GI bleed thought due to elevated INR and duodenitis  . External hemorrhoids without complication   . GERD (gastroesophageal reflux disease)   . H/O hypercholesterolemia    PMH: only  . H/O transfusion of packed red blood cells   . H/O: GI bleed   . Hemorrhage of rectum and anus   . History of embolic stroke Q000111Q  . HTN (hypertension)   . Hyperthyroidism   . Hypothyroidism   . Hypothyroidism   . Insomnia    prev tried Trazadone (2014); ambien CR caused hangover; on ambien since 11/2013  . Iron deficiency anemia due to chronic blood loss   . Long-term (current) use of anticoagulants   . Osteoarthritis of both knees   . Osteoarthritis of lumbar spine   . Osteoporosis   . Paroxysmal atrial fibrillation (HCC)   . Peripheral edema   . Postmenopausal   . Primary osteoarthritis of both hips   . Pyuria   . Stroke [facility])     Past Surgical History:  Procedure Laterality Date  . ABDOMINAL HYSTERECTOMY    . BREAST ENHANCEMENT SURGERY    .  CARDIOVERSION N/A 12/09/2015   Procedure: CARDIOVERSION;  Surgeon: Jerline Pain, MD;  Location: [place];  Service: Cardiovascular;  Laterality: N/A;  . CHOLECYSTECTOMY  03/11/2012   Procedure: LAPAROSCOPIC CHOLECYSTECTOMY WITH INTRAOPERATIVE CHOLANGIOGRAM;  Surgeon: Joyice Faster. Cornett, MD;  Location: [place];  Service: General;  Laterality: N/A;  . CHOLECYSTECTOMY    . COLONOSCOPY    . CYST EXCISION  11/2014   left chest wall--nodular hidroadenoma  . HEMORRHOID SURGERY    . LUMBAR LAMINECTOMY N/A 09/29/2014   Procedure: L4-5 Decompression, Hemi-Laminectomy,  Removal Free Fragment;  Surgeon: Marybelle Killings, MD;  Location: [place];  Service: Orthopedics;  Laterality: N/A;  . OPERATIVE HYSTEROSCOPY    . ORIF PATELLA Left 10/25/2014   Procedure: OPEN REDUCTION INTERNAL (ORIF) FIXATION LEFT  PATELLA;  Surgeon: Marybelle Killings, MD;  Location: [place];  Service: Orthopedics;  Laterality: Left;  . TEE WITHOUT CARDIOVERSION N/A 12/09/2015   Procedure: TRANSESOPHAGEAL ECHOCARDIOGRAM (TEE);  Surgeon: Jerline Pain, MD;  Location: [place];  Service: Cardiovascular;  Laterality: N/A;  . WISDOM TOOTH EXTRACTION       Current Outpatient Prescriptions  Medication Sig Dispense Refill  . calcium carbonate (TUMS - DOSED IN MG ELEMENTAL CALCIUM) 500 MG chewable tablet Chew 1 tablet by mouth 2 (two) times daily.    Marland Kitchen diltiazem (CARDIZEM CD) 120 MG 24 hr capsule Take 1 capsule (120 mg total) by mouth daily. 90 capsule 1  . diltiazem (DILACOR XR) 240 MG 24 hr capsule Take 1 capsule (240 mg total) by mouth every morning. 90 capsule 3  . FERREX 150 150 MG capsule TAKE ONE CAPSULE BY MOUTH EVERY DAY 30 capsule 1  . gabapentin (NEURONTIN) 100 MG capsule Take 200 mg by mouth 2 (two) times daily.     . hydrochlorothiazide (HYDRODIURIL) 25 MG tablet Take 0.5 tablets (12.5 mg total) by mouth daily. 45 tablet 1  . hydrocortisone (ANUSOL-HC) 25 MG suppository Place 1 suppository (25 mg total) rectally at bedtime. 10 suppository 0  . levothyroxine (SYNTHROID, LEVOTHROID) 25 MCG tablet TAKE 1 TABLET DAILY BEFORE BREAKFAST 90 tablet 1  . linaclotide (LINZESS) 72 MCG capsule Take 1 capsule (72 mcg total) by mouth daily before breakfast. 30 capsule 3  . lisinopril (PRINIVIL,ZESTRIL) 20 MG tablet Take 20 mg by mouth daily.    . Magnesium 400 MG CAPS Take 1 tablet by mouth daily.    . metoprolol (LOPRESSOR) 25 MG tablet Take 1 tablet (25 mg total) by mouth 2 (two) times daily. 180 tablet 3  . Multiple Vitamin (MULITIVITAMIN WITH MINERALS) TABS Take 1 tablet by mouth daily.    . pantoprazole (PROTONIX) 40 MG tablet Take 40 mg by mouth daily.    . potassium chloride (K-DUR,KLOR-CON) 10 MEQ tablet Take 10 mEq by mouth 2 (two) times daily.    . pravastatin (PRAVACHOL) 20 MG tablet Take 20 mg by mouth every evening.      Marland Kitchen PREMARIN vaginal cream USE 1 GRAM VAGINALLY AT BEDTIME 2 TIMES A WEEK  6  . warfarin (COUMADIN) 5 MG tablet TAKE AS DIRECTED BY COUMADIN CLINIC 80 tablet 1  . zolpidem (AMBIEN CR) 6.25 MG CR tablet TAKE 1 TABLET BY MOUTH EVERY NIGHT 90 tablet 0   No current facility-administered medications for this visit.     Allergies:   Celebrex [celecoxib] and Lipitor [atorvastatin calcium]    Social History:  The patient  reports that she quit smoking about 6 years ago. Her smoking use included Cigarettes. She has  never used smokeless tobacco. She reports that she does not drink alcohol or use drugs.   Family History:  The patient's family history includes Cancer in her brother; Dementia in her father; Stroke in her mother.    ROS:  General:no colds or fevers, no weight changes Skin:no rashes or ulcers HEENT:no blurred vision, no congestion CV:see HPI PUL:see HPI GI:no diarrhea constipation or melena, no indigestion GU:no hematuria, no dysuria MS:no joint pain, no claudication Neuro:no syncope, occ lightheadedness Endo:no diabetes, + thyroid disease  Wt Readings from Last 3 Encounters:  10/11/16 125 lb 6.4 oz (56.9 kg)  09/25/16 123 lb 6.4 oz (56 kg)  08/29/16 122 lb 6.4 oz (55.5 kg)     PHYSICAL EXAM: VS:  BP 120/80   Pulse 91   Ht 5\' 6"  (1.676 m)   Wt 125 lb 6.4 oz (56.9 kg)   BMI 20.24 kg/m  , BMI Body mass index is 20.24 kg/m. General:Pleasant affect, NAD Skin:Warm and dry, brisk capillary refill HEENT:normocephalic, sclera clear, mucus membranes moist Neck:supple, no JVD, no bruits  Heart:S1S2 RRR without murmur, gallup, rub or click Lungs:clear without rales, rhonchi, or wheezes [id number], non tender, + BS, do not palpate liver spleen or masses Ext:no lower ext edema, 2+ pedal pulses, 2+ radial pulses Neuro:alert and oriented, MAE, follows commands, + facial symmetry    EKG:  EKG is ordered today. The ekg ordered today demonstrates atrial fib rate controlled, no ST  changes from previous.    Recent Labs: 05/17/2016: ALT 12; Hemoglobin 12.7; Platelets 193; TSH 1.14 06/27/2016: BUN 25; Creat 1.11; Potassium 4.1; Sodium 137    Lipid Panel    Component Value Date/Time   CHOL 188 05/17/2016 0750   TRIG 110 05/17/2016 0750   HDL 56 05/17/2016 0750   CHOLHDL 3.4 05/17/2016 0750   VLDL 22 05/17/2016 0750   LDLCALC 110 05/17/2016 0750       Other studies Reviewed: Additional studies/ records that were reviewed today include: .  TEE: Study Conclusions  - Left ventricle: Systolic function was normal. The estimated   ejection fraction was in the range of 55% to 60%. Wall motion was   normal; there were no regional wall motion abnormalities. - Aortic valve: No evidence of vegetation. No evidence of   vegetation. - Mitral valve: No evidence of vegetation. There was mild   regurgitation. - Left atrium: No evidence of thrombus in the atrial cavity or   appendage. - Right atrium: No evidence of thrombus in the atrial cavity or   appendage.  ASSESSMENT AND PLAN:  1.  Persistent atrial fibrillation  - Last cardioversion in December 2016, single shock with restoration of sinus rhythm.  - Symptomatic atrial fibrillation in the past but she states that sometimes she does not truly feel her atrial fibrillation. -- now back in a fib with rate control but extreme fatigue HR 90-110 --discussed with Dr. Rayann Heman- DOD she does have some tachy brady syndrome hx of normal EF and no hx of CAD will plan for flecainide 50 mg BID once I have labs back.  I will see her back next week and if still in a fib will increase to 100 mg BID, if HR slower would decrease the diltiazem first.   --BMP, CBC, TSH   2.  Fatigue- severe in a fib  - metoprolol was decreased from 50 mg twice a day down to 25 mg twice a day in August. Continues on dilt 240 in AM and 120 in pm.  3. Chronic anticoagulation  - On Coumadin therapy.  - Use Lovenox bridge for procedures.  - Has  history of embolic stroke off of Coumadin.  4.  History of stroke  - This occurred when she was in sinus rhythm off of Coumadin.  5.  Hypertensive heart disease without heart failure  - Doing well, overall well controlled.     Current medicines are reviewed with the patient today.  The patient Has no concerns regarding medicines.  The following changes have been made:  See above Labs/ tests ordered today include:see above  Disposition:   FU:  see above  Signed, Cecilie Kicks, NP  10/11/2016 4:25 PM    [facility] [place], [place], [place] Phone: [phone]; Fax: [phone]

## 2016-10-11 NOTE — Patient Instructions (Addendum)
Medications:  START Flecainide 50 mg--1 tab twice daily.  Labwork:  Your physician recommends that you return for lab work today--CBC, BMP, TSH   Follow-Up:  Your physician recommends that you schedule a follow-up appointment in: next Thursday with Cecilie Kicks, NP.

## 2016-10-17 ENCOUNTER — Encounter: Payer: Self-pay | Admitting: Cardiology

## 2016-10-17 NOTE — Progress Notes (Signed)
Cardiology Office Note   Date:  10/18/2016   ID:  Lisa Lucero, DOB November 19, 1935, MRN [id number]  PCP:  Lisa Ports, MD  Cardiologist:  Dr. Marlou Lucero    Chief Complaint  Patient presents with  . Atrial Fibrillation    fatigue improved      History of Present Illness: Lisa Lucero is a 81 y.o. female who presents for afib, persistent - added flecainide last visit.   History of paroxysmal atrial fibrillation last hospitalized in March 2010 on long-term Coumadin for anticoagulation. Previously, she had gone off of Coumadin and was maintaining sinus rhythm when she was hospitalized with embolic stroke and then placed back on long-term Coumadin. In October 2011 she was admitted with a GI bleed secondary to erosive duodenitis. Since. He, has been doing well.  She has had back surgery by Dr. Inda Lucero as well as patellar fracture surgery in 2015.  In December 2016 she went back into atrial fibrillation and underwent a TEE cardioversion by Dr. Marlou Lucero which was successful. She feels better in sinus rhythm. Decreased exertional dyspnea.  In July 2017 she underwent thyroid biopsy and had Lovenox bridge because of her prior history of stroke when coming off of Coumadin.  On 08/29/16 but decided to decrease her metoprolol from 50 twice a day down to 25 twice a day for fatigue. Periodically she will have an EKG in our system showing heart rate of 52 bpm. Sometimes at home her blood pressure monitor will show heart rates upper 40s.  Last week she presented after calling in with extreme fatigue.   She thought she was in a fib and  EKG revealed she was..  Rate was controlled at 91.  No acute EKG changes otherwise.  At home her HR is 110.  No chest Lucero and no SOB.  She has a chronic dizziness may be a little increased.  I discussed with Dr. Rayann Heman and we added flecainide.    Today she is in SB and QRS stable.  She has no Lucero and no SOB.  She has ben dizzy.  Her fatigue has improved.  We will  proceed with ETT for QRS widening on flecainide in 2 weeks.        Past Medical History:  Diagnosis Date  . Anemia    Iron deficiency anemia  . Arthritis   . Arthropathy   . Back Lucero   . Blood in stool   . Chronic kidney disease   . COPD (chronic obstructive pulmonary disease) ([place])    pt denies on 09/27/14  . CVA (cerebral vascular accident) ([place])   . Erosive gastritis    with GI bleed thought due to elevated INR and duodenitis  . External hemorrhoids without complication   . GERD (gastroesophageal reflux disease)   . H/O hypercholesterolemia    PMH: only  . H/O transfusion of packed red blood cells   . H/O: GI bleed   . Hemorrhage of rectum and anus   . History of embolic stroke Q000111Q  . HTN (hypertension)   . Hyperthyroidism   . Hypothyroidism   . Hypothyroidism   . Insomnia    prev tried Trazadone (2014); ambien CR caused hangover; on ambien since 11/2013  . Iron deficiency anemia due to chronic blood loss   . Long-term (current) use of anticoagulants   . Osteoarthritis of both knees   . Osteoarthritis of lumbar spine   . Osteoporosis   . Paroxysmal atrial fibrillation (HCC)   . Peripheral  edema   . Postmenopausal   . Primary osteoarthritis of both hips   . Pyuria   . Stroke [facility])     Past Surgical History:  Procedure Laterality Date  . ABDOMINAL HYSTERECTOMY    . BREAST ENHANCEMENT SURGERY    . CARDIOVERSION N/A 12/09/2015   Procedure: CARDIOVERSION;  Surgeon: Lisa Pain, MD;  Location: [place];  Service: Cardiovascular;  Laterality: N/A;  . CHOLECYSTECTOMY  03/11/2012   Procedure: LAPAROSCOPIC CHOLECYSTECTOMY WITH INTRAOPERATIVE CHOLANGIOGRAM;  Surgeon: Lisa Faster. Cornett, MD;  Location: [place];  Service: General;  Laterality: N/A;  . CHOLECYSTECTOMY    . COLONOSCOPY    . CYST EXCISION  11/2014   left chest wall--nodular hidroadenoma  . HEMORRHOID SURGERY    . LUMBAR LAMINECTOMY N/A 09/29/2014   Procedure: L4-5 Decompression, Hemi-Laminectomy,   Removal Free Fragment;  Surgeon: Lisa Killings, MD;  Location: [place];  Service: Orthopedics;  Laterality: N/A;  . OPERATIVE HYSTEROSCOPY    . ORIF PATELLA Left 10/25/2014   Procedure: OPEN REDUCTION INTERNAL (ORIF) FIXATION LEFT PATELLA;  Surgeon: Lisa Killings, MD;  Location: [place];  Service: Orthopedics;  Laterality: Left;  . TEE WITHOUT CARDIOVERSION N/A 12/09/2015   Procedure: TRANSESOPHAGEAL ECHOCARDIOGRAM (TEE);  Surgeon: Lisa Pain, MD;  Location: [place];  Service: Cardiovascular;  Laterality: N/A;  . WISDOM TOOTH EXTRACTION       Current Outpatient Prescriptions  Medication Sig Dispense Refill  . calcium carbonate (TUMS - DOSED IN MG ELEMENTAL CALCIUM) 500 MG chewable tablet Chew 1 tablet by mouth 2 (two) times daily.    Marland Kitchen diltiazem (CARDIZEM CD) 120 MG 24 hr capsule Take 1 capsule (120 mg total) by mouth daily. 90 capsule 1  . FERREX 150 150 MG capsule TAKE ONE CAPSULE BY MOUTH EVERY DAY 30 capsule 1  . flecainide (TAMBOCOR) 50 MG tablet Take 1 tablet (50 mg total) by mouth 2 (two) times daily. 60 tablet 1  . gabapentin (NEURONTIN) 100 MG capsule Take 200 mg by mouth 2 (two) times daily.     . hydrochlorothiazide (HYDRODIURIL) 25 MG tablet Take 0.5 tablets (12.5 mg total) by mouth daily. 45 tablet 1  . hydrocortisone (ANUSOL-HC) 25 MG suppository Place 1 suppository (25 mg total) rectally at bedtime. 10 suppository 0  . levothyroxine (SYNTHROID, LEVOTHROID) 25 MCG tablet TAKE 1 TABLET DAILY BEFORE BREAKFAST 90 tablet 1  . linaclotide (LINZESS) 72 MCG capsule Take 1 capsule (72 mcg total) by mouth daily before breakfast. 30 capsule 3  . lisinopril (PRINIVIL,ZESTRIL) 20 MG tablet Take 1.5 tablets (30 mg total) by mouth daily. 90 tablet 1  . Magnesium 400 MG CAPS Take 1 tablet by mouth daily.    . metoprolol (LOPRESSOR) 25 MG tablet Take 1 tablet (25 mg total) by mouth 2 (two) times daily. 180 tablet 3  . Multiple Vitamin (MULITIVITAMIN WITH MINERALS) TABS Take 1 tablet by mouth  daily.    . pantoprazole (PROTONIX) 40 MG tablet Take 40 mg by mouth daily.    . potassium chloride (K-DUR,KLOR-CON) 10 MEQ tablet Take 10 mEq by mouth 2 (two) times daily.    . pravastatin (PRAVACHOL) 20 MG tablet Take 20 mg by mouth every evening.    Marland Kitchen PREMARIN vaginal cream USE 1 GRAM VAGINALLY AT BEDTIME 2 TIMES A WEEK  6  . warfarin (COUMADIN) 5 MG tablet TAKE AS DIRECTED BY COUMADIN CLINIC 80 tablet 1  . zolpidem (AMBIEN CR) 6.25 MG CR tablet TAKE 1 TABLET BY MOUTH EVERY NIGHT  90 tablet 0   No current facility-administered medications for this visit.     Allergies:   Celebrex [celecoxib] and Lipitor [atorvastatin calcium]    Social History:  The patient  reports that she quit smoking about 6 years ago. Her smoking use included Cigarettes. She has never used smokeless tobacco. She reports that she does not drink alcohol or use drugs.   Family History:  The patient's family history includes Cancer in her brother; Dementia in her father; Stroke in her mother.    ROS:  General:no colds or fevers, no weight changes Skin:no rashes or ulcers HEENT:no blurred vision, no congestion CV:see HPI PUL:see HPI GI:no diarrhea constipation or melena, no indigestion GU:no hematuria, no dysuria MS:no joint Lucero, no claudication, some ankle swelling Neuro:no syncope, no lightheadedness Endo:no diabetes, no thyroid disease  Wt Readings from Last 3 Encounters:  10/18/16 125 lb (56.7 kg)  10/11/16 125 lb 6.4 oz (56.9 kg)  09/25/16 123 lb 6.4 oz (56 kg)     PHYSICAL EXAM: VS:  BP (!) 142/80   Pulse (!) 53   Ht 5\' 6"  (1.676 m)   Wt 125 lb (56.7 kg)   BMI 20.18 kg/m  , BMI Body mass index is 20.18 kg/m. General:Pleasant affect, NAD Skin:Warm and dry, brisk capillary refill HEENT:normocephalic, sclera clear, mucus membranes moist Neck:supple, no JVD, no bruits  Heart:S1S2 RRR without murmur, gallup, rub or click Lungs:clear without rales, rhonchi, or wheezes [id number], non tender, + BS,  do not palpate liver spleen or masses Ext:tr lt lower ext edema, rt without edma, 1+ pedal pulses, 2+ radial pulses Neuro:alert and oriented X 3, MAE, follows commands, + facial symmetry    EKG:  EKG is ordered today. The ekg ordered today demonstrates Glade Stanford with first degree AV block PR 236 ms.  Otherwise normal .   Recent Labs: 05/17/2016: ALT 12 10/11/2016: BUN 17; Creat 1.18; Hemoglobin 12.5; Platelets 247; Potassium 3.9; Sodium 131; TSH 1.00    Lipid Panel    Component Value Date/Time   CHOL 188 05/17/2016 0750   TRIG 110 05/17/2016 0750   HDL 56 05/17/2016 0750   CHOLHDL 3.4 05/17/2016 0750   VLDL 22 05/17/2016 0750   LDLCALC 110 05/17/2016 0750       Other studies Reviewed: Additional studies/ records that were reviewed today include: see last week's note..   ASSESSMENT AND PLAN:   1.  Persistent atrial fibrillation - Last cardioversion in December 2016, single shock with restoration of sinus rhythm. - Symptomatic atrial fibrillationin the past but she states that sometimes she does not truly feel her atrial fibrillation. -- last week back in a fib with rate control but extreme fatigue HR 90-110 --discussed with Dr. Rayann Heman- DOD she does have some tachy brady syndrome hx of normal EF and no hx of CAD will added flecainide 50 mg BID .   -her HR is 53 today and she has had increased dizziness at home will stop dilt 240 mg and continue the 120 mg tab.  Continue metoprolol  --BMP, CBC, TSH done were all normal.  -ETT for QRS widening with Flecainide  2.  Fatigue- severe in a fib - Improved now in SR  Previously metoprolol was decreased from 50 mg twice a day down to 25 mg twice a day in August when she was in [place].  . 3. Chronic anticoagulation - On Coumadin therapy. - Use Lovenox bridge for procedures. - Has history of embolic stroke off of Coumadin.  4.  History of stroke - This occurred when she was in sinus rhythm off of Coumadin.  5.   Hypertensive heart disease without heart failure - Doing well, overall well controlled. But with decreasing cardizem will increase lisinopril to 30 mg.    I will see her back after the ETT.  Current medicines are reviewed with the patient today.  The patient Has no concerns regarding medicines.  The following changes have been made:  See above Labs/ tests ordered today include:see above  Disposition:   FU:  see above  Signed, Cecilie Kicks, NP  10/18/2016 1:32 PM    [facility] [place], [place], [place], [place] Phone: [phone]; Fax: [phone]

## 2016-10-18 ENCOUNTER — Ambulatory Visit (INDEPENDENT_AMBULATORY_CARE_PROVIDER_SITE_OTHER): Payer: Medicare Other | Admitting: Cardiology

## 2016-10-18 ENCOUNTER — Telehealth: Payer: Self-pay | Admitting: *Deleted

## 2016-10-18 ENCOUNTER — Ambulatory Visit (INDEPENDENT_AMBULATORY_CARE_PROVIDER_SITE_OTHER): Payer: Medicare Other

## 2016-10-18 ENCOUNTER — Encounter: Payer: Self-pay | Admitting: Cardiology

## 2016-10-18 VITALS — BP 142/80 | HR 53 | Ht 66.0 in | Wt 125.0 lb

## 2016-10-18 DIAGNOSIS — Z5181 Encounter for therapeutic drug level monitoring: Secondary | ICD-10-CM

## 2016-10-18 DIAGNOSIS — I481 Persistent atrial fibrillation: Secondary | ICD-10-CM | POA: Diagnosis not present

## 2016-10-18 DIAGNOSIS — I10 Essential (primary) hypertension: Secondary | ICD-10-CM | POA: Diagnosis not present

## 2016-10-18 DIAGNOSIS — E039 Hypothyroidism, unspecified: Secondary | ICD-10-CM | POA: Diagnosis not present

## 2016-10-18 DIAGNOSIS — I4891 Unspecified atrial fibrillation: Secondary | ICD-10-CM

## 2016-10-18 DIAGNOSIS — Z7901 Long term (current) use of anticoagulants: Secondary | ICD-10-CM

## 2016-10-18 DIAGNOSIS — I4819 Other persistent atrial fibrillation: Secondary | ICD-10-CM

## 2016-10-18 LAB — POCT INR: INR: 1.6

## 2016-10-18 MED ORDER — LISINOPRIL 20 MG PO TABS: 30.0000 mg | ORAL_TABLET | Freq: Every day | ORAL | 1 refills | Status: DC

## 2016-10-18 NOTE — Telephone Encounter (Signed)
lvm per Cecilie Kicks, NP, needs to have GXT two weeks out instead of one week.  Appointment needs to be re-scheduled two weeks out.

## 2016-10-18 NOTE — Patient Instructions (Signed)
Medication Instructions:  Your physician has recommended you make the following change in your medication:  1. Discontinue your morning dose of Diltiazem (240 mg ) in the am 2. Increase lisinopril one and one half tablets (30 mg ) daily. Sent in to patient's pharmacy today.l   Labwork: -None  Testing/Procedures: Your physician has requested that you have an exercise tolerance test. For further information please visit [email or web address]. Please also follow instruction sheet, as given.    Follow-Up: Your physician recommends that you keep your scheduled follow-up appointment with Cecilie Kicks, NP.    Any Other Special Instructions Will Be Listed Below (If Applicable).     If you need a refill on your cardiac medications before your next appointment, please call your pharmacy.

## 2016-10-22 ENCOUNTER — Telehealth: Payer: Self-pay | Admitting: Family Medicine

## 2016-10-22 ENCOUNTER — Encounter: Payer: Self-pay | Admitting: Cardiology

## 2016-10-22 NOTE — Telephone Encounter (Signed)
Rcvd refill request for Gabapentin 100mg 

## 2016-10-23 ENCOUNTER — Telehealth: Payer: Self-pay | Admitting: Gastroenterology

## 2016-10-23 NOTE — Telephone Encounter (Signed)
This was filled for #360 5/30 with a refill, should last 6 months, so should have another 1 month left supply before refill--request is a little early.  Okay to refill #360, no additional refill. She will need a med check within the next 3 months (before further refills needed)

## 2016-10-23 NOTE — Telephone Encounter (Signed)
error 

## 2016-10-24 ENCOUNTER — Other Ambulatory Visit: Payer: Self-pay

## 2016-10-24 ENCOUNTER — Telehealth: Payer: Self-pay | Admitting: Cardiology

## 2016-10-24 ENCOUNTER — Other Ambulatory Visit: Payer: Self-pay | Admitting: *Deleted

## 2016-10-24 DIAGNOSIS — N760 Acute vaginitis: Secondary | ICD-10-CM | POA: Diagnosis not present

## 2016-10-24 DIAGNOSIS — N952 Postmenopausal atrophic vaginitis: Secondary | ICD-10-CM | POA: Diagnosis not present

## 2016-10-24 MED ORDER — HYDROCORTISONE ACETATE 25 MG RE SUPP: 25.0000 mg | Freq: Every day | RECTAL | 0 refills | Status: DC

## 2016-10-24 MED ORDER — GABAPENTIN 100 MG PO CAPS: 200.0000 mg | ORAL_CAPSULE | Freq: Two times a day (BID) | ORAL | 0 refills | Status: DC

## 2016-10-24 NOTE — Telephone Encounter (Signed)
New Message  Pt c/o medication issue:  1. Name of Medication: diltiazem  2. How are you currently taking this medication (dosage and times per day)? 120 mg 24 hr capsule once daily  3. Are you having a reaction (difficulty breathing--STAT)? No  4. What is your medication issue? No, pt voiced her HR is 120 and she took this medication once already and wants to know if she can take it again to lower her HR again.  Please f/u with pt

## 2016-10-24 NOTE — Progress Notes (Signed)
Cardiology Office Note   Date:  10/25/2016   ID:  Lisa Lucero, DOB 1935-01-28, MRN [id number]  PCP:  Vikki Ports, MD  Cardiologist:  Dr. Marlou Porch    Chief Complaint  Patient presents with  . Atrial Fibrillation    at times HR to 120       History of Present Illness: Lisa Lucero is a 81 y.o. female who presents for recurrent tachycardia.  She had taken the diltizem. We had decreased dose from 240 in AM and 120 in pm to 120 mg daily.  Today she is feeling better.  I had increased lisinopril as well though she is taking higher dose of lisinopril than prescribed. Still with some dizziness.  On 08/29/16 but decided to decrease her metoprolol from 50 twice a day down to 25 twice a day for fatigue. Periodically she will have an EKG in our system showing heart rate of 52 bpm. Sometimes at home her blood pressure monitor will show heart rates upper 40s.  2 weeks ago she presented after calling in with extreme fatigue. She thought she was in a fib and  EKG revealed she was.. Rate was controlled at 91. No acute EKG changes otherwise. At home her HR is 110. No chest pain and no SOB. She has a chronic dizziness may be a little increased.  I discussed with Dr. Rayann Heman and we added flecainide.    On 10/18/16 she was in SB and QRS stable.  She has no pain and no SOB.  She has ben dizzy.  Her fatigue has improved.  We will proceed with ETT for QRS widening on flecainide in 2 weeks.       Today back due to perhaps break through a fib. Currently in SR.  She has obvious neck mass now, she tells me it is growing so more obvious, I had not seen on previous visits.  She has had it biopsied.  No swallowing difficulties. No chest pain or SOB.     Past Medical History:  Diagnosis Date  . Anemia    Iron deficiency anemia  . Arthritis   . Arthropathy   . Back pain   . Blood in stool   . Chronic kidney disease   . COPD (chronic obstructive pulmonary disease) ([place])    pt denies on 09/27/14    . CVA (cerebral vascular accident) ([place])   . Erosive gastritis    with GI bleed thought due to elevated INR and duodenitis  . External hemorrhoids without complication   . GERD (gastroesophageal reflux disease)   . H/O hypercholesterolemia    PMH: only  . H/O transfusion of packed red blood cells   . H/O: GI bleed   . Hemorrhage of rectum and anus   . History of embolic stroke [id number]  . HTN (hypertension)   . Hyperthyroidism   . Hypothyroidism   . Hypothyroidism   . Insomnia    prev tried Trazadone (2014); ambien CR caused hangover; on ambien since 11/2013  . Iron deficiency anemia due to chronic blood loss   . Long-term (current) use of anticoagulants   . Osteoarthritis of both knees   . Osteoarthritis of lumbar spine   . Osteoporosis   . Paroxysmal atrial fibrillation (HCC)   . Peripheral edema   . Postmenopausal   . Primary osteoarthritis of both hips   . Pyuria   . Stroke [facility])     Past Surgical History:  Procedure Laterality Date  . ABDOMINAL HYSTERECTOMY    .  BREAST ENHANCEMENT SURGERY    . CARDIOVERSION N/A 12/09/2015   Procedure: CARDIOVERSION;  Surgeon: Jerline Pain, MD;  Location: [place];  Service: Cardiovascular;  Laterality: N/A;  . CHOLECYSTECTOMY  03/11/2012   Procedure: LAPAROSCOPIC CHOLECYSTECTOMY WITH INTRAOPERATIVE CHOLANGIOGRAM;  Surgeon: Joyice Faster. Cornett, MD;  Location: [place];  Service: General;  Laterality: N/A;  . CHOLECYSTECTOMY    . COLONOSCOPY    . CYST EXCISION  11/2014   left chest wall--nodular hidroadenoma  . HEMORRHOID SURGERY    . LUMBAR LAMINECTOMY N/A 09/29/2014   Procedure: L4-5 Decompression, Hemi-Laminectomy,  Removal Free Fragment;  Surgeon: Marybelle Killings, MD;  Location: [place];  Service: Orthopedics;  Laterality: N/A;  . OPERATIVE HYSTEROSCOPY    . ORIF PATELLA Left 10/25/2014   Procedure: OPEN REDUCTION INTERNAL (ORIF) FIXATION LEFT PATELLA;  Surgeon: Marybelle Killings, MD;  Location: [place];  Service: Orthopedics;  Laterality:  Left;  . TEE WITHOUT CARDIOVERSION N/A 12/09/2015   Procedure: TRANSESOPHAGEAL ECHOCARDIOGRAM (TEE);  Surgeon: Jerline Pain, MD;  Location: [place];  Service: Cardiovascular;  Laterality: N/A;  . WISDOM TOOTH EXTRACTION       Current Outpatient Prescriptions  Medication Sig Dispense Refill  . calcium carbonate (TUMS - DOSED IN MG ELEMENTAL CALCIUM) 500 MG chewable tablet Chew 1 tablet by mouth 2 (two) times daily.    Marland Kitchen diltiazem (CARDIZEM CD) 120 MG 24 hr capsule Take 1 capsule (120 mg total) by mouth daily. 90 capsule 1  . estradiol (ESTRACE) 0.1 MG/GM vaginal cream Place 1 Applicatorful vaginally 2 (two) times a week.    Marland Kitchen FERREX 150 150 MG capsule TAKE ONE CAPSULE BY MOUTH EVERY DAY 30 capsule 1  . flecainide (TAMBOCOR) 50 MG tablet Take 1 tablet (50 mg total) by mouth 2 (two) times daily. 60 tablet 1  . gabapentin (NEURONTIN) 100 MG capsule Take 2 capsules (200 mg total) by mouth 2 (two) times daily. 360 capsule 0  . hydrochlorothiazide (HYDRODIURIL) 25 MG tablet Take 0.5 tablets (12.5 mg total) by mouth daily. 45 tablet 1  . hydrocortisone (ANUSOL-HC) 25 MG suppository Place 1 suppository (25 mg total) rectally at bedtime. 10 suppository 0  . levothyroxine (SYNTHROID, LEVOTHROID) 25 MCG tablet TAKE 1 TABLET DAILY BEFORE BREAKFAST 90 tablet 1  . linaclotide (LINZESS) 72 MCG capsule Take 1 capsule (72 mcg total) by mouth daily before breakfast. 30 capsule 3  . lisinopril (PRINIVIL,ZESTRIL) 20 MG tablet Take 1.5 tablets (30 mg total) by mouth daily. 90 tablet 1  . Magnesium 400 MG CAPS Take 1 tablet by mouth daily.    . metoprolol (LOPRESSOR) 25 MG tablet Take 1 tablet (25 mg total) by mouth 2 (two) times daily. 180 tablet 3  . Multiple Vitamin (MULITIVITAMIN WITH MINERALS) TABS Take 1 tablet by mouth daily.    . pantoprazole (PROTONIX) 40 MG tablet Take 40 mg by mouth daily.    . potassium chloride (K-DUR,KLOR-CON) 10 MEQ tablet Take 10 mEq by mouth 2 (two) times daily.    .  pravastatin (PRAVACHOL) 20 MG tablet Take 20 mg by mouth every evening.    Marland Kitchen PREMARIN vaginal cream USE 1 GRAM VAGINALLY AT BEDTIME 2 TIMES A WEEK  6  . warfarin (COUMADIN) 5 MG tablet TAKE AS DIRECTED BY COUMADIN CLINIC 80 tablet 1  . zolpidem (AMBIEN CR) 6.25 MG CR tablet TAKE 1 TABLET BY MOUTH EVERY NIGHT 90 tablet 0   No current facility-administered medications for this visit.     Allergies:  Celebrex [celecoxib] and Lipitor [atorvastatin calcium]    Social History:  The patient  reports that she quit smoking about 6 years ago. Her smoking use included Cigarettes. She has never used smokeless tobacco. She reports that she does not drink alcohol or use drugs.   Family History:  The patient's family history includes Cancer in her brother; Dementia in her father; Stroke in her mother.    ROS:  General:no colds or fevers, no weight changes Skin:no rashes or ulcers HEENT:no blurred vision, no congestion CV:see HPI PUL:see HPI GI:no diarrhea constipation or melena, no indigestion GU:no hematuria, no dysuria MS:no joint pain, no claudication Neuro:no syncope, no lightheadedness Endo:no diabetes, hx of thyroid nodule and on thyroid, last labs stable.   Wt Readings from Last 3 Encounters:  10/25/16 122 lb 6.4 oz (55.5 kg)  10/18/16 125 lb (56.7 kg)  10/11/16 125 lb 6.4 oz (56.9 kg)     PHYSICAL EXAM: VS:  BP 102/70   Pulse 69   Ht 5\' 6"  (1.676 m)   Wt 122 lb 6.4 oz (55.5 kg)   SpO2 96%   BMI 19.76 kg/m  , BMI Body mass index is 19.76 kg/m. General:Pleasant affect, NAD Skin:Warm and dry, brisk capillary refill HEENT:normocephalic, sclera clear, mucus membranes moist Neck:supple, 2-3 cm mass rt side of neck, no JVD, no bruits  Heart:S1S2 RRR without murmur, gallup, rub or click Lungs:clear without rales, rhonchi, or wheezes [id number], non tender, + BS, do not palpate liver spleen or masses Ext:no lower ext edema, 2+ pedal pulses, 2+ radial pulses Neuro:alert and oriented,  MAE, follows commands, + facial symmetry    EKG:  EKG is ordered today. The ekg ordered today demonstrates SR no acute changes.   Recent Labs: 05/17/2016: ALT 12 10/11/2016: BUN 17; Creat 1.18; Hemoglobin 12.5; Platelets 247; Potassium 3.9; Sodium 131; TSH 1.00    Lipid Panel    Component Value Date/Time   CHOL 188 05/17/2016 0750   TRIG 110 05/17/2016 0750   HDL 56 05/17/2016 0750   CHOLHDL 3.4 05/17/2016 0750   VLDL 22 05/17/2016 0750   LDLCALC 110 05/17/2016 0750       Other studies Reviewed: Additional studies/ records that were reviewed today include: see previous note.   ASSESSMENT AND PLAN:  1. Persistentatrial fibrillation - Last cardioversion in December 2016, single shock with restoration of sinus rhythm. - Symptomatic atrial fibrillationin the past but she states that sometimes she does not truly feel her atrial fibrillation. -- last week back in a fib with rate control but extreme fatigue HR 90-110 --discussed with Dr. Rayann Heman- DOD she does have some tachy brady syndrome hx of normal EF and no hx of CAD will added flecainide 50 mg BID .  -her HR is 53 today and she has had increased dizziness at home will stop dilt 240 mg and continue the 120 mg tab.  Continue metoprolol  --BMP, CBC, TSH done were all normal.  -ETT for QRS widening with Flecainide  Now with tachycardia at times, will check 48 hour holter and if PAF then increase flecainide.   2. Fatigue- severe in a fib - Improved now in SR  Previously metoprolol was decreased from 50 mg twice a day down to 25 mg twice a day in August when she was in [place].  . 3. Chronic anticoagulation - On Coumadin therapy. - Use Lovenox bridge for procedures. - Has history of embolic stroke off of Coumadin.  4. History of stroke - This occurred when she  was in sinus rhythm off of Coumadin.  5. Hypertensive heart disease without heart failure - Doing well, overall well controlled. But with decreasing  cardizem will increase lisinopril to 30 mg.  reviewed this with pt only to take 30 mg not 40.  6. Neck mass increasing in size.  She will contact Dr. Tomi Bamberger and I will send note.    Will decide after holter and ETT if we should see back here vs. A fib clinic.       Current medicines are reviewed with the patient today.  The patient Has no concerns regarding medicines.  The following changes have been made:  See above Labs/ tests ordered today include:see above  Disposition:   FU:  see above  Signed, Cecilie Kicks, NP  10/25/2016 12:56 PM    [facility], [place], [place], [place] Phone: [phone]; Fax: [phone]

## 2016-10-24 NOTE — Telephone Encounter (Signed)
Patient did not pick up the suppositories on 10/02/16, she thinks. Seemed a little unsure of why I was calling her. Rx for Anusol [facility] suppositories re-transmitted to the pharmacy. Patient has my name and will call back if there are further issues.

## 2016-10-24 NOTE — Telephone Encounter (Signed)
Pt calling this am because her HR has been elevated about 100 bpm for 2 days.  This AM she reports it is 120 bpm.  In reviewing her medications she has not been taking Metoprolol 25 mg BID as instructed nor has she been taking Lisinopril 20 mg 1 and 1/2 tablet daily.  She did take Diltiazem 120 mg the last 2 mornings which had been d/ced at her last appt with Cecilie Kicks.  She states she didn't receive a medication list however she was reading her medications from a list.  Advised to take Metoprolol as instructed.  Reviewed medication list and instructed pt to take medications as listed.  She states understanding and will take the Metoprolol 25 mg now.  Advised to call back if HR doesn't decrease below 100 bpm.  She does report taking Coumadin as instructed.  She is aware I will forward this information to Dr Marlou Porch for his knowledge and any further instructions or orders.

## 2016-10-25 ENCOUNTER — Institutional Professional Consult (permissible substitution): Payer: Medicare Other | Admitting: Family Medicine

## 2016-10-25 ENCOUNTER — Encounter: Payer: Self-pay | Admitting: Cardiology

## 2016-10-25 ENCOUNTER — Ambulatory Visit (INDEPENDENT_AMBULATORY_CARE_PROVIDER_SITE_OTHER): Payer: Medicare Other | Admitting: Cardiology

## 2016-10-25 VITALS — BP 102/70 | HR 69 | Ht 66.0 in | Wt 122.4 lb

## 2016-10-25 DIAGNOSIS — I4891 Unspecified atrial fibrillation: Secondary | ICD-10-CM | POA: Diagnosis not present

## 2016-10-25 DIAGNOSIS — I10 Essential (primary) hypertension: Secondary | ICD-10-CM | POA: Diagnosis not present

## 2016-10-25 DIAGNOSIS — I481 Persistent atrial fibrillation: Secondary | ICD-10-CM | POA: Diagnosis not present

## 2016-10-25 DIAGNOSIS — E041 Nontoxic single thyroid nodule: Secondary | ICD-10-CM

## 2016-10-25 DIAGNOSIS — I4819 Other persistent atrial fibrillation: Secondary | ICD-10-CM

## 2016-10-25 DIAGNOSIS — E039 Hypothyroidism, unspecified: Secondary | ICD-10-CM | POA: Diagnosis not present

## 2016-10-25 MED ORDER — LISINOPRIL 20 MG PO TABS: 30.0000 mg | ORAL_TABLET | Freq: Every day | ORAL | 1 refills | Status: DC

## 2016-10-25 NOTE — Patient Instructions (Addendum)
Medication Instructions:  Your physician has recommended you make the following change in your medication:  1.  TAKE Lisinopril 20 mg taking 1 tablet in the morning and 1/2 tablet at night   Labwork: None ordered  Testing/Procedures: Your physician has recommended that you wear a holter monitor. Holter monitors are medical devices that record the heart's electrical activity. Doctors most often use these monitors to diagnose arrhythmias. Arrhythmias are problems with the speed or rhythm of the heartbeat. The monitor is a small, portable device. You can wear one while you do your normal daily activities. This is usually used to diagnose what is causing palpitations/syncope (passing out).    Follow-Up: Your physician recommends that you schedule a follow-up appointment in: [place]   Any Other Special Instructions Will Be Listed Below (If Applicable).    Holter Monitoring A Holter monitor is a small device that is used to detect abnormal heart rhythms. It clips to your clothing and is connected by wires to flat, sticky disks (electrodes) that attach to your chest. It is worn continuously for 24-48 hours. HOME CARE INSTRUCTIONS  Wear your Holter monitor at all times, even while exercising and sleeping, for as long as directed by your health care provider.  Make sure that the Holter monitor is safely clipped to your clothing or close to your body as recommended by your health care provider.  Do not get the monitor or wires wet.  Do not put body lotion or moisturizer on your chest.  Keep your skin clean.  Keep a diary of your daily activities, such as walking and doing chores. If you feel that your heartbeat is abnormal or that your heart is fluttering or skipping a beat:  Record what you are doing when it happens.  Record what time of day the symptoms occur.  Return your Holter monitor as directed by your health care provider.  Keep all follow-up  visits as directed by your health care provider. This is important. SEEK IMMEDIATE MEDICAL CARE IF:  You feel lightheaded or you faint.  You have trouble breathing.  You feel pain in your chest, upper arm, or jaw.  You feel sick to your stomach and your skin is pale, cool, or damp.  You heartbeat feels unusual or abnormal.   This information is not intended to replace advice given to you by your health care provider. Make sure you discuss any questions you have with your health care provider.   Document Released: 09/14/2004 Document Revised: 01/07/2015 Document Reviewed: 07/26/2014 Elsevier Interactive Patient Education [employer].   If you need a refill on your cardiac medications before your next appointment, please call your pharmacy.

## 2016-10-26 ENCOUNTER — Ambulatory Visit (INDEPENDENT_AMBULATORY_CARE_PROVIDER_SITE_OTHER): Payer: Medicare Other

## 2016-10-26 DIAGNOSIS — I481 Persistent atrial fibrillation: Secondary | ICD-10-CM

## 2016-10-26 DIAGNOSIS — I4819 Other persistent atrial fibrillation: Secondary | ICD-10-CM

## 2016-10-26 NOTE — Telephone Encounter (Signed)
I will forward this message to Cecilie Kicks who looks like she may been the last to see pt. Pt was seen by Cecilie Kicks, PA 10/25/16. Not sure why this was sent to me, pt has not seen Richardson Dopp, PA from what I can see.

## 2016-10-26 NOTE — Telephone Encounter (Signed)
Office visit with Mickel Baas has taken place on 10/26 after this phone note. Issues have been addressed. Flecainide added.  Candee Furbish, MD

## 2016-11-01 ENCOUNTER — Ambulatory Visit: Payer: Medicare Other

## 2016-11-01 ENCOUNTER — Ambulatory Visit (INDEPENDENT_AMBULATORY_CARE_PROVIDER_SITE_OTHER): Payer: Medicare Other | Admitting: *Deleted

## 2016-11-01 ENCOUNTER — Telehealth: Payer: Self-pay | Admitting: *Deleted

## 2016-11-01 DIAGNOSIS — I4891 Unspecified atrial fibrillation: Secondary | ICD-10-CM | POA: Diagnosis not present

## 2016-11-01 DIAGNOSIS — E039 Hypothyroidism, unspecified: Secondary | ICD-10-CM

## 2016-11-01 DIAGNOSIS — Z5181 Encounter for therapeutic drug level monitoring: Secondary | ICD-10-CM

## 2016-11-01 LAB — POCT INR: INR: 1.7

## 2016-11-01 MED ORDER — LISINOPRIL 20 MG PO TABS: 40.0000 mg | ORAL_TABLET | Freq: Every day | ORAL | 1 refills | Status: AC

## 2016-11-01 NOTE — Telephone Encounter (Signed)
Pt come in for ett and bp was high, so Cecilie Kicks, NP, increased her Lisinopril 20 mg taking 1 tablet twice a day instead of 1 in the a.m and 1/2 at night.

## 2016-11-05 ENCOUNTER — Telehealth: Payer: Self-pay | Admitting: Cardiology

## 2016-11-05 NOTE — Telephone Encounter (Signed)
Left message for patient to call back for monitor results 

## 2016-11-05 NOTE — Telephone Encounter (Signed)
New Message  Pt states she is returning RN call. Please call back to discuss

## 2016-11-05 NOTE — Telephone Encounter (Signed)
Reviewed monitor results with patient who verbalized understanding and thanked me for the call.  

## 2016-11-08 ENCOUNTER — Other Ambulatory Visit: Payer: Self-pay | Admitting: Gastroenterology

## 2016-11-09 ENCOUNTER — Other Ambulatory Visit: Payer: Self-pay | Admitting: *Deleted

## 2016-11-09 DIAGNOSIS — L309 Dermatitis, unspecified: Secondary | ICD-10-CM | POA: Diagnosis not present

## 2016-11-09 DIAGNOSIS — Z85828 Personal history of other malignant neoplasm of skin: Secondary | ICD-10-CM | POA: Diagnosis not present

## 2016-11-09 MED ORDER — WARFARIN SODIUM 5 MG PO TABS: ORAL_TABLET | ORAL | 1 refills | Status: AC

## 2016-11-15 ENCOUNTER — Ambulatory Visit (INDEPENDENT_AMBULATORY_CARE_PROVIDER_SITE_OTHER): Payer: Medicare Other

## 2016-11-15 DIAGNOSIS — I4891 Unspecified atrial fibrillation: Secondary | ICD-10-CM

## 2016-11-15 DIAGNOSIS — Z5181 Encounter for therapeutic drug level monitoring: Secondary | ICD-10-CM | POA: Diagnosis not present

## 2016-11-15 LAB — EXERCISE TOLERANCE TEST
CHL RATE OF PERCEIVED EXERTION: 15
CSEPED: 11 min
CSEPEDS: 0 s
CSEPEW: 7 METS
MPHR: 96 {beats}/min
Peak HR: 93 {beats}/min
Percent HR: 65 %
Rest HR: 66 {beats}/min

## 2016-11-15 LAB — POCT INR: INR: 1.5

## 2016-11-20 ENCOUNTER — Other Ambulatory Visit: Payer: Self-pay | Admitting: Cardiology

## 2016-11-20 DIAGNOSIS — Z23 Encounter for immunization: Secondary | ICD-10-CM | POA: Diagnosis not present

## 2016-11-21 ENCOUNTER — Telehealth: Payer: Self-pay | Admitting: Cardiology

## 2016-11-21 NOTE — Telephone Encounter (Signed)
F/u Message  Pt returning RN call about test results. Please call back to disucss

## 2016-11-21 NOTE — Telephone Encounter (Signed)
Pt has been made aware of her stress test results and she verbalized appreciation.

## 2016-11-21 NOTE — Telephone Encounter (Signed)
-----   Message from Isaiah Serge, NP sent at 11/19/2016  9:14 PM EST ----- Flecainide is good on current dose- stress test is stable for medications.

## 2016-11-25 ENCOUNTER — Other Ambulatory Visit: Payer: Self-pay | Admitting: Family Medicine

## 2016-11-26 ENCOUNTER — Telehealth: Payer: Self-pay | Admitting: Gastroenterology

## 2016-11-26 NOTE — Telephone Encounter (Signed)
Left message for her to call back. No earlier appointments at this time. Also, she is on Coumadin. Her prescribing provider will need to decide on bridging before she can be banded.

## 2016-11-27 NOTE — Telephone Encounter (Signed)
Request to hold coumadin sent to Dr Marlou Porch. Patient is scheduled for a hem band on 12/20/16.

## 2016-11-28 ENCOUNTER — Telehealth: Payer: Self-pay | Admitting: Pharmacist

## 2016-11-28 ENCOUNTER — Encounter: Payer: Medicare Other | Admitting: Gastroenterology

## 2016-11-28 NOTE — Telephone Encounter (Signed)
Received clearance request from Dr Silverio Decamp with [facility] for pt to hold Coumadin for 7 days prior to hemorrhoid banding procedure on 12/21. Pt takes Coumadin for afib with CHADS2 score of 4 (history of stroke, HTN, and age >12). She needs Lovenox bridging given hx of stroke and has done Lovenox bridging multiple times before. Will coordinate in Coumadin clinic. Clearance faxed to [facility] [phone].

## 2016-11-28 NOTE — Telephone Encounter (Signed)
Spoke with the patient. She is aware of the plan.

## 2016-11-29 ENCOUNTER — Ambulatory Visit (INDEPENDENT_AMBULATORY_CARE_PROVIDER_SITE_OTHER): Payer: Medicare Other | Admitting: *Deleted

## 2016-11-29 ENCOUNTER — Other Ambulatory Visit: Payer: Self-pay | Admitting: Gastroenterology

## 2016-11-29 ENCOUNTER — Telehealth: Payer: Self-pay | Admitting: Family Medicine

## 2016-11-29 DIAGNOSIS — I4891 Unspecified atrial fibrillation: Secondary | ICD-10-CM | POA: Diagnosis not present

## 2016-11-29 DIAGNOSIS — Z5181 Encounter for therapeutic drug level monitoring: Secondary | ICD-10-CM

## 2016-11-29 LAB — POCT INR: INR: 2.9

## 2016-11-29 NOTE — Telephone Encounter (Signed)
Message left regarding this.

## 2016-11-29 NOTE — Telephone Encounter (Signed)
Pt said she thinks that she may be having a reaction to either her Pravastatin or Zolpidem. For several weeks she has noticed that she is dizzy and has increased loss of memory.

## 2016-11-29 NOTE — Telephone Encounter (Signed)
This would need an office visit to evaluate.  She has been on these both for a while. Zolpidem would be the most likely to affect memory/dizziness between these two, but many other things can cause this.  Please set up 30 min visit to assess (needs MMSE)

## 2016-12-10 ENCOUNTER — Telehealth: Payer: Self-pay | Admitting: Gastroenterology

## 2016-12-10 ENCOUNTER — Ambulatory Visit (INDEPENDENT_AMBULATORY_CARE_PROVIDER_SITE_OTHER): Payer: Medicare Other | Admitting: *Deleted

## 2016-12-10 DIAGNOSIS — I4891 Unspecified atrial fibrillation: Secondary | ICD-10-CM

## 2016-12-10 DIAGNOSIS — Z5181 Encounter for therapeutic drug level monitoring: Secondary | ICD-10-CM

## 2016-12-10 LAB — POCT INR: INR: 3

## 2016-12-10 MED ORDER — ENOXAPARIN SODIUM 60 MG/0.6ML ~~LOC~~ SOLN: 60.0000 mg | Freq: Two times a day (BID) | SUBCUTANEOUS | 1 refills | Status: DC

## 2016-12-10 NOTE — Telephone Encounter (Signed)
Patient called and advised she is on the schedule for banding. She states she just doesn't like doing the Lovenox injections.

## 2016-12-10 NOTE — Patient Instructions (Addendum)
12/12/16- Last dose of Coumadin.  12/13/16-No Coumadin or Lovenox.  12/14/16- Inject Lovenox 60 mg in the fatty abdominal tissue at least 2 inches from the belly button twice a day about 12 hours apart, 9am and 9pm rotate sites. No Coumadin.  12/15/16- Inject Lovenox in the fatty tissue every 12 hours, 9am and 9pm. No Coumadin.  12/16/16- Inject Lovenox in the fatty tissue every 12 hours, 9am and 9pm. No Coumadin.  12/17/16- Inject Lovenox in the fatty tissue every 12 hours, 9am and 9pm. No Coumadin.  12/18/16- Inject Lovenox in the fatty tissue every 12 hours, 9am and 9pm. No Coumadin.  12/19/16-Inject Lovenox in the fatty tissue in the morning at 9 am (No PM dose). No Coumadin.  12/20/16-Procedure Day - No Lovenox - Resume Coumadin in the evening or as directed by doctor (take an extra half tablet with usual dose for 2 days then resume normal dose). Coumadin dose would be 1.5 tablets  12/21/16- Resume Lovenox inject in the fatty tissue every 12 hours and take Coumadin. Coumadin dose will be 1 tablet, then resume normal.  12/22/16- Inject Lovenox in the fatty tissue every 12 hours and take Coumadin.  12/23/16- Inject Lovenox in the fatty tissue every 12 hours and take Coumadin.  12/24/16-Inject Lovenox in the fatty tissue every 12 hours and take Coumadin.  12/25/16- Inject Lovenox in the fatty tissue every 12 hours and take Coumadin.  12/26/16- Coumadin appt to check INR.

## 2016-12-19 ENCOUNTER — Telehealth: Payer: Self-pay | Admitting: Family Medicine

## 2016-12-19 ENCOUNTER — Telehealth: Payer: Self-pay | Admitting: *Deleted

## 2016-12-19 NOTE — Telephone Encounter (Signed)
#  [phone] sent to [facility] 10/25 (2 BID on instructions, 3 month supply--shouldn't need refill until next month  (She is scheduled for visit 1/18)

## 2016-12-19 NOTE — Telephone Encounter (Signed)
Pt called for refills of gabapentin. Please send to [facility].

## 2016-12-19 NOTE — Telephone Encounter (Signed)
Patient will call back when she has a week's worth left.

## 2016-12-19 NOTE — Telephone Encounter (Signed)
Checking to make sure pt aware to hold  Pt was aware

## 2016-12-20 ENCOUNTER — Ambulatory Visit (INDEPENDENT_AMBULATORY_CARE_PROVIDER_SITE_OTHER): Payer: Medicare Other | Admitting: Gastroenterology

## 2016-12-20 ENCOUNTER — Ambulatory Visit: Payer: Medicare Other | Admitting: Gastroenterology

## 2016-12-20 ENCOUNTER — Encounter: Payer: Self-pay | Admitting: Gastroenterology

## 2016-12-20 VITALS — BP 132/90 | HR 64 | Ht 66.0 in | Wt 120.0 lb

## 2016-12-20 DIAGNOSIS — K641 Second degree hemorrhoids: Secondary | ICD-10-CM

## 2016-12-20 NOTE — Patient Instructions (Signed)
HEMORRHOID BANDING PROCEDURE    FOLLOW-UP CARE   1. The procedure you have had should have been relatively painless since the banding of the area involved does not have nerve endings and there is no pain sensation.  The rubber band cuts off the blood supply to the hemorrhoid and the band may fall off as soon as 48 hours after the banding (the band may occasionally be seen in the toilet bowl following a bowel movement). You may notice a temporary feeling of fullness in the rectum which should respond adequately to plain Tylenol or Motrin.  2. Following the banding, avoid strenuous exercise that evening and resume full activity the next day.  A sitz bath (soaking in a warm tub) or bidet is soothing, and can be useful for cleansing the area after bowel movements.     3. To avoid constipation, take two tablespoons of natural wheat bran, natural oat bran, flax, Benefiber or any over the counter fiber supplement and increase your water intake to 7-8 glasses daily.    4. Unless you have been prescribed anorectal medication, do not put anything inside your rectum for two weeks: No suppositories, enemas, fingers, etc.  5. Occasionally, you may have more bleeding than usual after the banding procedure.  This is often from the untreated hemorrhoids rather than the treated one.  Don't be concerned if there is a tablespoon or so of blood.  If there is more blood than this, lie flat with your bottom higher than your head and apply an ice pack to the area. If the bleeding does not stop within a half an hour or if you feel faint, call our office at [phone] or go to the emergency room.  6. Problems are not common; however, if there is a substantial amount of bleeding, severe pain, chills, fever or difficulty passing urine (very rare) or other problems, you should call us at [phone] or report to the nearest emergency room.  7. Do not stay seated continuously for more than 2-3 hours for a day or two  after the procedure.  Tighten your buttock muscles 10-15 times every two hours and take 10-15 deep breaths every 1-2 hours.  Do not spend more than a few minutes on the toilet if you cannot empty your bowel; instead re-visit the toilet at a later time.   Start your coumadin back as instructed by your coumadin doctor

## 2016-12-20 NOTE — Progress Notes (Signed)
PROCEDURE NOTE: The patient presents with symptomatic grade II  hemorrhoids, requesting rubber band ligation of his/her hemorrhoidal disease.  All risks, benefits and alternative forms of therapy were described and informed consent was obtained.  In the Left Lateral Decubitus position anoscopic examination revealed grade II hemorrhoids in the R anterior, R posterior and left lateral position(s).  The anorectum was pre-medicated with 0.125% Nitroglycerine and Recticare The decision was made to band the Right anterior internal hemorrhoid, and the Micco was used to perform band ligation without complication.  Digital anorectal examination was then performed to assure proper positioning of the band, and to adjust the banded tissue as required.  The patient was discharged home without pain or other issues.  Dietary and behavioral recommendations were given and along with follow-up instructions.     Ok to restart Coumadin tomorrow, adjust dose as per coumadin clinic  The patient will return in 2-4 weeks for  follow-up and possible additional banding as required. No complications were encountered and the patient tolerated the procedure well.  Damaris Hippo , MD [phone] Mon-Fri 8a-5p [phone] after 5p, weekends, holidays

## 2016-12-26 ENCOUNTER — Ambulatory Visit (INDEPENDENT_AMBULATORY_CARE_PROVIDER_SITE_OTHER): Payer: Medicare Other | Admitting: *Deleted

## 2016-12-26 DIAGNOSIS — I4891 Unspecified atrial fibrillation: Secondary | ICD-10-CM

## 2016-12-26 DIAGNOSIS — Z5181 Encounter for therapeutic drug level monitoring: Secondary | ICD-10-CM

## 2016-12-26 LAB — POCT INR: INR: 2

## 2016-12-27 ENCOUNTER — Other Ambulatory Visit: Payer: Self-pay | Admitting: Family Medicine

## 2016-12-27 NOTE — Telephone Encounter (Signed)
Is this okay to refill? 

## 2016-12-27 NOTE — Telephone Encounter (Signed)
Pt called stating that pharmacy has advised her that they will not be able to give her the Ambien refill after it is approved until Tuesday and pt says she only has #5 pills left so she is requesting that Dr Tomi Bamberger give Josem Kaufmann to refill early as well

## 2016-12-27 NOTE — Telephone Encounter (Signed)
It looks like this was last prescribed for #90 on 09/17/16, so I'm not sure why they are saying it is an early refill.  Ok to refill #90 now.

## 2016-12-28 ENCOUNTER — Telehealth: Payer: Self-pay | Admitting: Internal Medicine

## 2016-12-28 NOTE — Telephone Encounter (Signed)
phrmacy called and states that her insurance will only cover #30 at a time on ambien so they will do #30 with 2 refills.

## 2017-01-01 ENCOUNTER — Other Ambulatory Visit: Payer: Self-pay | Admitting: Family Medicine

## 2017-01-01 ENCOUNTER — Telehealth: Payer: Self-pay | Admitting: Gastroenterology

## 2017-01-01 NOTE — Telephone Encounter (Signed)
Spoke with pt and she states she had some rectal bleeding over the weekend, reports there was BRB in her underpants. She has had no more bleeding. Pt had banding done 12/20/16 of her hemorrhoid. Pt instructed to let us know if she has any more issues with bleeding, she verbalized understanding.

## 2017-01-08 ENCOUNTER — Telehealth: Payer: Self-pay | Admitting: Gastroenterology

## 2017-01-08 MED ORDER — AMBULATORY NON FORMULARY MEDICATION: 0 refills | Status: AC

## 2017-01-08 NOTE — Telephone Encounter (Signed)
Doc of the Day Patient of Dr Woodward Ku with known hemorrhoids. She has had 1 banding and is on the schedule for another. She is anti-coagulated. She calls today because she has become very uncomfortable. She complains of rectal pain including a burning and raw feeling. She gset temporary relief with sitz bathes and lidocaine rectal ointmentn her level of pain with bowel movements. Her appointment is 01/21/17 with Dr Silverio Decamp. Please advise on what can be done in the meanwhile to keep her comfortable.

## 2017-01-08 NOTE — Telephone Encounter (Signed)
Have her get 0.125% NTG ointment and use tid - qid - apply into anal canal - [employer]  I think she will need to be seen sooner if this fails to provide relief so if that does not help her much call back -   Please cc Dr. Silverio Decamp when done

## 2017-01-08 NOTE — Telephone Encounter (Signed)
Spoke with pt and she is aware. Script [facility].

## 2017-01-09 ENCOUNTER — Ambulatory Visit (INDEPENDENT_AMBULATORY_CARE_PROVIDER_SITE_OTHER): Payer: Medicare Other | Admitting: *Deleted

## 2017-01-09 DIAGNOSIS — I4891 Unspecified atrial fibrillation: Secondary | ICD-10-CM

## 2017-01-09 DIAGNOSIS — Z5181 Encounter for therapeutic drug level monitoring: Secondary | ICD-10-CM

## 2017-01-09 LAB — POCT INR: INR: 2.1

## 2017-01-10 ENCOUNTER — Other Ambulatory Visit: Payer: Self-pay | Admitting: Family Medicine

## 2017-01-14 ENCOUNTER — Telehealth: Payer: Self-pay | Admitting: Gastroenterology

## 2017-01-15 NOTE — Telephone Encounter (Signed)
I explained this to the patient.

## 2017-01-15 NOTE — Telephone Encounter (Signed)
This patient is on your schedule for 01/21/17. She is on for 2 bandings. That will be banding 2 and 3. Would you do this while she is on Coumadin?

## 2017-01-15 NOTE — Telephone Encounter (Signed)
I can do the hemorrhoidal banding while on anticoagulation with Coumadin as long as patient is aware of potential risk for bleeding but wouldn't recommend doing two bandings at once as it would further increase the risk for complications especially bleeding

## 2017-01-17 ENCOUNTER — Encounter: Payer: Medicare Other | Admitting: Family Medicine

## 2017-01-21 ENCOUNTER — Ambulatory Visit (INDEPENDENT_AMBULATORY_CARE_PROVIDER_SITE_OTHER): Payer: Medicare Other | Admitting: Gastroenterology

## 2017-01-21 ENCOUNTER — Encounter: Payer: Self-pay | Admitting: Gastroenterology

## 2017-01-21 VITALS — BP 148/72 | HR 72 | Ht 65.0 in | Wt 120.4 lb

## 2017-01-21 DIAGNOSIS — K641 Second degree hemorrhoids: Secondary | ICD-10-CM

## 2017-01-21 NOTE — Progress Notes (Signed)
PROCEDURE NOTE: The patient presents with symptomatic grade II  hemorrhoids, requesting rubber band ligation of his/her hemorrhoidal disease.  All risks, benefits and alternative forms of therapy were described and informed consent was obtained.  In the Left Lateral Decubitus position anoscopic examination revealed grade II hemorrhoids in the R posterior position(s) and Grade I in R anterior and L lateral position.   The anorectum was pre-medicated with 0.125% Nitroglycerine and Recticare The decision was made to band the Right posterior internal hemorrhoid, and the [place] was used to perform band ligation without complication.  Digital anorectal examination was then performed to assure proper positioning of the band, and to adjust the banded tissue as required.  The patient was discharged home without pain or other issues.  Dietary and behavioral recommendations were given and along with follow-up instructions.     The following adjunctive treatments were recommended: Restart Coumadin today, dosing as per coumadin clinic [place] to use Recticare as needed  The patient will return for  follow-up and possible additional banding as required. No complications were encountered and the patient tolerated the procedure well.  Damaris Hippo , MD [phone] Mon-Fri 8a-5p [phone] after 5p, weekends, holidays

## 2017-01-21 NOTE — Patient Instructions (Addendum)
HEMORRHOID BANDING PROCEDURE    FOLLOW-UP CARE   1. The procedure you have had should have been relatively painless since the banding of the area involved does not have nerve endings and there is no pain sensation.  The rubber band cuts off the blood supply to the hemorrhoid and the band may fall off as soon as 48 hours after the banding (the band may occasionally be seen in the toilet bowl following a bowel movement). You may notice a temporary feeling of fullness in the rectum which should respond adequately to plain Tylenol or Motrin.  2. Following the banding, avoid strenuous exercise that evening and resume full activity the next day.  A sitz bath (soaking in a warm tub) or bidet is soothing, and can be useful for cleansing the area after bowel movements.     3. To avoid constipation, take two tablespoons of natural wheat bran, natural oat bran, flax, Benefiber or any over the counter fiber supplement and increase your water intake to 7-8 glasses daily.    4. Unless you have been prescribed anorectal medication, do not put anything inside your rectum for two weeks: No suppositories, enemas, fingers, etc.  5. Occasionally, you may have more bleeding than usual after the banding procedure.  This is often from the untreated hemorrhoids rather than the treated one.  Don't be concerned if there is a tablespoon or so of blood.  If there is more blood than this, lie flat with your bottom higher than your head and apply an ice pack to the area. If the bleeding does not stop within a half an hour or if you feel faint, call our office at [phone] or go to the emergency room.  6. Problems are not common; however, if there is a substantial amount of bleeding, severe pain, chills, fever or difficulty passing urine (very rare) or other problems, you should call us at [phone] or report to the nearest emergency room.  7. Do not stay seated continuously for more than 2-3 hours for a day or two  after the procedure.  Tighten your buttock muscles 10-15 times every two hours and take 10-15 deep breaths every 1-2 hours.  Do not spend more than a few minutes on the toilet if you cannot empty your bowel; instead re-visit the toilet at a later time.   Well do next banding as needed

## 2017-01-23 NOTE — Progress Notes (Signed)
Chief Complaint  Patient presents with  . Hypertension    nonfasting med check.    Patient presents for med check.  She truly isn't sure why she is here.  Chart was reviewed and there was concern from cardiologist about enlarging thyroid (see October visit), and also to follow-up regarding phone call from patient 11/29/16:   Pt said she thinks that she may be having a reaction to either her Pravastatin or Zolpidem. For several weeks she has noticed that she is dizzy and has increased loss of memory.     She was advised:  She has been on these both for a while. Zolpidem would be the most likely to affect memory/dizziness between these two, but many other things can cause this.  Please set up 30 min visit to assess (needs MMSE). She reports she feels fine now--no longer having the dizziness, and memory seems fine.  She "barely remembers" calling about this. She got lost on the way to the office today, and was late for her appointment. Denies getting lost other times. "looking for [place], not [place]" street, doesn't come out this way often. Denies losing keys, car in parking lot. Denies leaving stove on, leaving door open, etc.   Last seen by me in June at which time she has BPV.  She was referred for PT.  She did go for physical therapy, and is improved. She sometimes "will stagger a little, but not like before".  Denies any falls.  Hypertension follow-up:  Blood pressures are checked at home sometimes, running up to 130/70. Denies dizziness, headaches, chest pain.  Denies side effects of medications. Denies tachycardia or palpitations, doing well on the flecainide.  She has atrial fibrillation and is under the care of the cardiologist. She had metoprolol dose decreased in 07/2016 (some bradycardia); flecainide was added in 09/2016.  Had some changes in BP meds by cardiologist around that time as well--diltiazem dose had been decreased, and lisinopril dose was increased (at first from 20  to '30mg'$ , but then up to '20mg'$  BID when BP's high in November). She is followed in the coumadin clinic.  Denies any bleeding. She recently had hemorrhoidal banding, which she tolerated without bleeding or complication. Lab Results  Component Value Date   INR 2.1 01/09/2017   INR 2.0 12/26/2016   INR 3.0 12/10/2016    Hyperlipidemia follow-up:  Patient is reportedly following a low-fat, low cholesterol diet.  Compliant with medications and denies medication side effects.  Lab Results  Component Value Date   CHOL 188 05/17/2016   HDL 56 05/17/2016   LDLCALC 110 05/17/2016   TRIG 110 05/17/2016   CHOLHDL 3.4 05/17/2016    Hypothyroidism and thyroid nodules--she no longer sees Dr. Chalmers Cater. Compliant with taking her thyroid medication daily--takes with her blood pressure medications. Takes the calcium at night.  Thyroid ultrasound was last done 06/2016. IMPRESSION: 4.8 cm solid nodule is seen in right thyroid lobe which appears to be enlarged compared to prior exam. Findings meet consensus criteria for biopsy. Ultrasound-guided fine needle aspiration should be considered, as per the consensus statement: Management of Thyroid Nodules Detected at [place]: Society of Radiologists in [place]. Radiology 2005; [id number].  She had thyroid biopsy in 07/2016: CONSISTENT WITH BENIGN FOLLICULAR NODULE (B[place]ES[place] CATEGORY II).  Denies fatigue, weight changes, no hair/skin/bowel changes. Denies any changes to the size of the nodule, or any difficulty swallowing. She is not concerned about it increasing in size as mentioned to her cardiologist in October.  She now reports it seems smaller. Last TSH 3 months ago was at goal. Lab Results  Component Value Date   TSH 1.00 10/11/2016   Insomnia--requires zolpidem every night.  Can't fall asleep without it.  Takes it every night.  Denies unusual behavior, grogginess in the morning. She sleeps well, and has good energy throughout  the day.   Patient was supposed to find out last tetanus date, and if >10 years, to get one from the pharmacy.  No record of tetanus date.  She has not yet looked into this.   MMSE done --29/30 25th of January 2017, Thursday,Winter Orientation correct [employer] [place], [place], [place], [employer] building, 1st floor Normal recall and late recall Normal naming object, repeating sentence DLROW--normal speed, and forgot to spell forward first, like asked. Normal writing sentence ("I love my cat")--later reported she made that up, she doesn't even have one! Normal 3 stage command Normal reading sentence and following (closing eyes). Normal intersecting pentagon drawing Clock drawing--normal circle and number placement, but no hands on the clock.  Animal naming--10 in 30 seconds, including porcupine, hippopotamus, alligator.   PMH, PSH, SH reviewed  Outpatient Encounter Prescriptions as of 01/24/2017  Medication Sig Note  . diltiazem (CARDIZEM CD) 120 MG 24 hr capsule Take 1 capsule (120 mg total) by mouth daily.   Marland Kitchen FERREX 150 150 MG capsule TAKE ONE CAPSULE BY MOUTH EVERY DAY   . flecainide (TAMBOCOR) 50 MG tablet Take 1 tablet (50 mg total) by mouth 2 (two) times daily.   Marland Kitchen gabapentin (NEURONTIN) 100 MG capsule TAKE 2 CAPSULES TWICE A DAY   . hydrochlorothiazide (HYDRODIURIL) 25 MG tablet Take 0.5 tablets (12.5 mg total) by mouth daily.   Marland Kitchen levothyroxine (SYNTHROID, LEVOTHROID) 25 MCG tablet TAKE 1 TABLET DAILY BEFORE BREAKFAST   . lisinopril (PRINIVIL,ZESTRIL) 20 MG tablet Take 2 tablets (40 mg total) by mouth daily.   . Magnesium 400 MG CAPS Take 1 tablet by mouth daily.   . metoprolol (LOPRESSOR) 25 MG tablet Take 1 tablet (25 mg total) by mouth 2 (two) times daily.   . Multiple Vitamin (MULITIVITAMIN WITH MINERALS) TABS Take 1 tablet by mouth daily.   . potassium chloride (K-DUR,KLOR-CON) 10 MEQ tablet Take 10 mEq by mouth 2 (two) times daily.   . pravastatin  (PRAVACHOL) 20 MG tablet TAKE 1 TABLET BY MOUTH EVERY EVENING   . warfarin (COUMADIN) 5 MG tablet TAKE AS DIRECTED BY COUMADIN CLINIC 01/24/2017: M,W and Fri '5mg'$  and 2.'5mg'$  other days.  Marland Kitchen zolpidem (AMBIEN CR) 6.25 MG CR tablet TAKE 1 TABLET BY MOUTH AT BEDTIME   . [DISCONTINUED] hydrochlorothiazide (HYDRODIURIL) 25 MG tablet Take 0.5 tablets (12.5 mg total) by mouth daily.   . AMBULATORY NON FORMULARY MEDICATION Medication Name: Nitroglycerin ointment 0.125% Insert pea sized amount into rectum 3-4 times a day as needed (Patient not taking: Reported on 01/24/2017)   . calcium carbonate (TUMS - DOSED IN MG ELEMENTAL CALCIUM) 500 MG chewable tablet Chew 1 tablet by mouth 2 (two) times daily.   . hydrocortisone (ANUSOL-HC) 25 MG suppository PLACE 1 SUPPOSITORY (25 MG TOTAL) RECTALLY AT BEDTIME. (Patient not taking: Reported on 01/24/2017)   . pantoprazole (PROTONIX) 40 MG tablet Take 40 mg by mouth daily.    No facility-administered encounter medications on file as of 01/24/2017.    Allergies  Allergen Reactions  . Celebrex [Celecoxib] Diarrhea  . Lipitor [Atorvastatin Calcium] Other (See Comments)    myalgias   ROS: no fever, chills, headaches,  dizziness, chest pain, shortness of breath, hair/skin/bowel changes, mood changes, fatigue.  +insomnia, chronic.  Denies bleeding, bruising, rash.  Denies pain.  Doing well from recent hemorrhoid banding, already feels better.   See HPI.  PHYSICAL EXAM:  BP 122/62 (BP Location: Right Arm, Patient Position: Sitting, Cuff Size: Normal)   Pulse 64   Ht '5\' 6"'$  (1.676 m)   Wt 119 lb 6.4 oz (54.2 kg)   BMI 19.27 kg/m   Thin, elderly, pleasant female in no distress HEENT: conjunctiva and sclera are clear, PERRL.  OP is clear Neck: no lymphadenopathy.  +goiter, multinodular, more prominent nodules on the right (about 2cm visible upon inspection, on the right). No carotid bruit Heart: regular rate and rhythm Lungs: clear bilaterally Back: no CVA  tenderness Abdomen: soft, nontender, no organomegaly or mass Extremities: no edema, normal pulses Neuro: alert and oriented, cranial nerves intact, normal gait Psych: normal mood, affect, hygiene and grooming Skin: no significant bruising noted, normal turgor, no lesions   ASSESSMENT/PLAN:  Hypothyroidism, unspecified type - and goiter--recent benign biopsy. Stable size. euthryoid by hx, normal TSH in October; continue current dose, recheck in 3 mos  Need for prophylactic vaccination and inoculation against influenza - Plan: Flu vaccine HIGH DOSE PF (Fluzone High dose)  Immunization due - Plan: Pneumococcal conjugate vaccine 13-valent  Essential hypertension, benign - well controlled - Plan: hydrochlorothiazide (HYDRODIURIL) 25 MG tablet  Edema of both legs - HCTZ also part of anti-hypertensive regimen; due for refill - Plan: hydrochlorothiazide (HYDRODIURIL) 25 MG tablet  Dyslipidemia - continue statin; recheck lipids in 3 mos (nonfasting today, last check fine in May)  Aortic atherosclerosis ([place])  Atrial fibrillation, unspecified type ([place]) - currently in NSR, doing well on flecainide and current regimen.  Long-term (current) use of anticoagulants - INR at goal; managed by coumadin clinic.  No bleeding  Neuropathic pain - resolved with gabapentin; continue  Insomnia, unspecified type - chronic; reviewed risks of zolpidem, encouraged prn use, if possible; not having adverse effects   Discussed zolpidem being high risk in elderly. Advised to try without it periodically.  Rx written and given to patient to get Tdap at pharmacy. shingrix recommended when available.  Flu shot and [id number] given  F/u 3 months med check, with fasting labs prior c-met, lipids, CBC, TSH     Please take the written prescription and get your tetanus shot (TdaP) at the pharmacy.  This is covered by Medicare part D, which is why I cannot give it to you in the office.  You must get it from the  pharmacy.  You were given a flu shot and a pneumonia vaccine today (prevnar-13).  I recommend getting the new shingles vaccine (Shingrix) when available. You will need to check with your insurance to see if it is covered, and if covered by Medicare Part D, you need to get from the pharmacy rather than our office.  It is a series of 2 injections, spaced 2 months apart.

## 2017-01-24 ENCOUNTER — Encounter: Payer: Self-pay | Admitting: Family Medicine

## 2017-01-24 ENCOUNTER — Ambulatory Visit (INDEPENDENT_AMBULATORY_CARE_PROVIDER_SITE_OTHER): Payer: Medicare Other | Admitting: Family Medicine

## 2017-01-24 VITALS — BP 122/62 | HR 64 | Ht 66.0 in | Wt 119.4 lb

## 2017-01-24 DIAGNOSIS — Z5181 Encounter for therapeutic drug level monitoring: Secondary | ICD-10-CM | POA: Diagnosis not present

## 2017-01-24 DIAGNOSIS — G4700 Insomnia, unspecified: Secondary | ICD-10-CM

## 2017-01-24 DIAGNOSIS — Z23 Encounter for immunization: Secondary | ICD-10-CM | POA: Diagnosis not present

## 2017-01-24 DIAGNOSIS — I4891 Unspecified atrial fibrillation: Secondary | ICD-10-CM | POA: Diagnosis not present

## 2017-01-24 DIAGNOSIS — E785 Hyperlipidemia, unspecified: Secondary | ICD-10-CM

## 2017-01-24 DIAGNOSIS — Z7901 Long term (current) use of anticoagulants: Secondary | ICD-10-CM

## 2017-01-24 DIAGNOSIS — R600 Localized edema: Secondary | ICD-10-CM

## 2017-01-24 DIAGNOSIS — I10 Essential (primary) hypertension: Secondary | ICD-10-CM

## 2017-01-24 DIAGNOSIS — M792 Neuralgia and neuritis, unspecified: Secondary | ICD-10-CM | POA: Diagnosis not present

## 2017-01-24 DIAGNOSIS — E039 Hypothyroidism, unspecified: Secondary | ICD-10-CM | POA: Diagnosis not present

## 2017-01-24 DIAGNOSIS — I700 Atherosclerosis of aorta: Secondary | ICD-10-CM | POA: Diagnosis not present

## 2017-01-24 MED ORDER — HYDROCHLOROTHIAZIDE 25 MG PO TABS: 12.5000 mg | ORAL_TABLET | Freq: Every day | ORAL | 1 refills | Status: AC

## 2017-01-24 NOTE — Patient Instructions (Signed)
  Please take the written prescription and get your tetanus shot (TdaP) at the pharmacy.  This is covered by Medicare part D, which is why I cannot give it to you in the office.  You must get it from the pharmacy.  You were given a flu shot and a pneumonia vaccine today (prevnar-13).  I recommend getting the new shingles vaccine (Shingrix) when available. You will need to check with your insurance to see if it is covered, and if covered by Medicare Part D, you need to get from the pharmacy rather than our office.  It is a series of 2 injections, spaced 2 months apart.   Sometime, try NOT taking the zolpidem for sleep if you are tired/exhausted. You may not need to truly take it every single day, especially if you feel tired.  You may continue to take it to help with sleep, if you cannot sleep at all without it.  It is not considered safe to use daily/long-term in folks >65 years old, so encouraged to use as little as needed.  If it is truly needed daily, go ahead and take it, as you don't seem to be having any adverse effects from it at this time.

## 2017-01-30 ENCOUNTER — Ambulatory Visit (INDEPENDENT_AMBULATORY_CARE_PROVIDER_SITE_OTHER): Payer: Medicare Other | Admitting: Pharmacist

## 2017-01-30 DIAGNOSIS — Z5181 Encounter for therapeutic drug level monitoring: Secondary | ICD-10-CM | POA: Diagnosis not present

## 2017-01-30 DIAGNOSIS — I4891 Unspecified atrial fibrillation: Secondary | ICD-10-CM | POA: Diagnosis not present

## 2017-01-30 LAB — POCT INR: INR: 1.5

## 2017-02-08 ENCOUNTER — Encounter: Payer: Medicare Other | Admitting: Gastroenterology

## 2017-02-13 ENCOUNTER — Ambulatory Visit (INDEPENDENT_AMBULATORY_CARE_PROVIDER_SITE_OTHER): Payer: Medicare Other | Admitting: *Deleted

## 2017-02-13 DIAGNOSIS — I4891 Unspecified atrial fibrillation: Secondary | ICD-10-CM | POA: Diagnosis not present

## 2017-02-13 DIAGNOSIS — Z5181 Encounter for therapeutic drug level monitoring: Secondary | ICD-10-CM | POA: Diagnosis not present

## 2017-02-13 LAB — POCT INR: INR: 2

## 2017-02-15 DIAGNOSIS — L258 Unspecified contact dermatitis due to other agents: Secondary | ICD-10-CM | POA: Diagnosis not present

## 2017-02-23 ENCOUNTER — Other Ambulatory Visit: Payer: Self-pay | Admitting: Family Medicine

## 2017-02-26 NOTE — Progress Notes (Signed)
CARDIOLOGY OFFICE NOTE  Date:  02/27/2017    Lisa Lucero Date of Birth: 01/27/1935 Medical Record [id number]  PCP:  Vikki Ports, MD  Cardiologist:  [facility]   Chief Complaint  Patient presents with  . Atrial Fibrillation    Follow up visit - seen for Dr. Marlou Porch    History of Present Illness: Lisa Lucero is a 82 y.o. female who presents today for a follow up visit. Seen for Dr. Marlou Porch.   She has a history of tachycardia, PAF, HTN and prior neck mass.   Seen back the last several times by Cecilie Kicks, NP - meds for her HR have been adjusted. She has had recent negative GXT and Holter for her AAD therapy for her AF.   Last seen back in October - was in NSR. Had obvious neck mass and was referred back to PCP.   Comes in today. Here alone. She says she is ok other "than being dizzy all the time". Says this is persistent.  She seems very unsure about her medicines. No chest pain. Breathing ok. No falls. No bleeding. Tells me she did not go see Dr. Tomi Bamberger - can't remember when she last saw her. Tells me she has a goiter (but after prompted by me). Rhythm has been ok.   Past Medical History:  Diagnosis Date  . Anemia    Iron deficiency anemia  . Arthritis   . Arthropathy   . Back pain   . Blood in stool   . Chronic kidney disease   . COPD (chronic obstructive pulmonary disease) ([place])    pt denies on 09/27/14  . CVA (cerebral vascular accident) ([place])   . Erosive gastritis    with GI bleed thought due to elevated INR and duodenitis  . External hemorrhoids without complication   . GERD (gastroesophageal reflux disease)   . H/O hypercholesterolemia    PMH: only  . H/O transfusion of packed red blood cells   . H/O: GI bleed   . Hemorrhage of rectum and anus   . History of embolic stroke Q000111Q  . HTN (hypertension)   . Hyperthyroidism   . Hypothyroidism   . Hypothyroidism   . Insomnia    prev tried Trazadone (2014); ambien CR caused hangover; on ambien since  11/2013  . Iron deficiency anemia due to chronic blood loss   . Long-term (current) use of anticoagulants   . Osteoarthritis of both knees   . Osteoarthritis of lumbar spine   . Osteoporosis   . Paroxysmal atrial fibrillation (HCC)   . Peripheral edema   . Postmenopausal   . Primary osteoarthritis of both hips   . Pyuria   . Stroke [facility])     Past Surgical History:  Procedure Laterality Date  . ABDOMINAL HYSTERECTOMY    . BREAST ENHANCEMENT SURGERY    . CARDIOVERSION N/A 12/09/2015   Procedure: CARDIOVERSION;  Surgeon: Jerline Pain, MD;  Location: [place];  Service: Cardiovascular;  Laterality: N/A;  . CHOLECYSTECTOMY  03/11/2012   Procedure: LAPAROSCOPIC CHOLECYSTECTOMY WITH INTRAOPERATIVE CHOLANGIOGRAM;  Surgeon: Joyice Faster. Cornett, MD;  Location: [place];  Service: General;  Laterality: N/A;  . CHOLECYSTECTOMY    . COLONOSCOPY    . CYST EXCISION  11/2014   left chest wall--nodular hidroadenoma  . HEMORRHOID SURGERY    . LUMBAR LAMINECTOMY N/A 09/29/2014   Procedure: L4-5 Decompression, Hemi-Laminectomy,  Removal Free Fragment;  Surgeon: Marybelle Killings, MD;  Location: [place];  Service:  Orthopedics;  Laterality: N/A;  . OPERATIVE HYSTEROSCOPY    . ORIF PATELLA Left 10/25/2014   Procedure: OPEN REDUCTION INTERNAL (ORIF) FIXATION LEFT PATELLA;  Surgeon: Marybelle Killings, MD;  Location: [place];  Service: Orthopedics;  Laterality: Left;  . TEE WITHOUT CARDIOVERSION N/A 12/09/2015   Procedure: TRANSESOPHAGEAL ECHOCARDIOGRAM (TEE);  Surgeon: Jerline Pain, MD;  Location: [facility] ENDOSCOPY;  Service: Cardiovascular;  Laterality: N/A;  . WISDOM TOOTH EXTRACTION       Medications: Current Outpatient Prescriptions  Medication Sig Dispense Refill  . AMBULATORY NON FORMULARY MEDICATION Medication Name: Nitroglycerin ointment 0.125% Insert pea sized amount into rectum 3-4 times a day as needed 30 g 0  . calcium carbonate (TUMS - DOSED IN MG ELEMENTAL CALCIUM) 500 MG chewable tablet Chew 1 tablet by  mouth 2 (two) times daily.    Marland Kitchen FERREX 150 150 MG capsule TAKE ONE CAPSULE BY MOUTH EVERY DAY 30 capsule 1  . flecainide (TAMBOCOR) 50 MG tablet Take 1 tablet (50 mg total) by mouth 2 (two) times daily. 60 tablet 1  . gabapentin (NEURONTIN) 100 MG capsule TAKE 2 CAPSULES TWICE A DAY 360 capsule 0  . hydrochlorothiazide (HYDRODIURIL) 25 MG tablet Take 0.5 tablets (12.5 mg total) by mouth daily. 45 tablet 1  . hydrocortisone (ANUSOL-HC) 25 MG suppository PLACE 1 SUPPOSITORY (25 MG TOTAL) RECTALLY AT BEDTIME. 10 suppository 0  . levothyroxine (SYNTHROID, LEVOTHROID) 25 MCG tablet TAKE 1 TABLET DAILY BEFORE BREAKFAST 90 tablet 0  . lisinopril (PRINIVIL,ZESTRIL) 20 MG tablet Take 2 tablets (40 mg total) by mouth daily. 180 tablet 1  . Magnesium 400 MG CAPS Take 1 tablet by mouth daily.    . metoprolol (LOPRESSOR) 25 MG tablet Take 1 tablet (25 mg total) by mouth 2 (two) times daily. 180 tablet 3  . Multiple Vitamin (MULITIVITAMIN WITH MINERALS) TABS Take 1 tablet by mouth daily.    . potassium chloride (K-DUR,KLOR-CON) 10 MEQ tablet Take 10 mEq by mouth 2 (two) times daily.    . pravastatin (PRAVACHOL) 20 MG tablet TAKE 1 TABLET BY MOUTH EVERY EVENING 30 tablet 10  . warfarin (COUMADIN) 5 MG tablet TAKE AS DIRECTED BY COUMADIN CLINIC 80 tablet 1  . zolpidem (AMBIEN CR) 6.25 MG CR tablet TAKE 1 TABLET BY MOUTH AT BEDTIME 90 tablet 0   No current facility-administered medications for this visit.     Allergies: Allergies  Allergen Reactions  . Celebrex [Celecoxib] Diarrhea  . Lipitor [Atorvastatin Calcium] Other (See Comments)    myalgias    Social History: The patient  reports that she quit smoking about 6 years ago. Her smoking use included Cigarettes. She has never used smokeless tobacco. She reports that she does not drink alcohol or use drugs.   Family History: The patient's family history includes Cancer in her brother; Dementia in her father; Stroke in her mother.   Review of  Systems: Please see the history of present illness.   Otherwise, the review of systems is positive for none.   All other systems are reviewed and negative.   Physical Exam: VS:  BP 136/90   Pulse 62   Ht 5\' 6"  (1.676 m)   Wt 119 lb 12.8 oz (54.3 kg)   BMI 19.34 kg/m  .  BMI Body mass index is 19.34 kg/m.  Wt Readings from Last 3 Encounters:  02/27/17 119 lb 12.8 oz (54.3 kg)  01/24/17 119 lb 6.4 oz (54.2 kg)  01/21/17 120 lb 6 oz (54.6 kg)  General: Pleasant. Elderly female. She is thin. She is alert and in no acute distress.   HEENT: Normal.  Neck: Supple, no JVD, carotid bruits, or masses noted.  Cardiac: Regular rate and rhythm. No murmurs, rubs, or gallops. No edema.  Respiratory:  Lungs are clear to auscultation bilaterally with normal work of breathing.  GI: Soft and nontender.  MS: No deformity or atrophy. Gait and ROM intact.  Skin: Warm and dry. Color is normal.  Neuro:  Strength and sensation are intact and no gross focal deficits noted.  Psych: Alert, appropriate and with normal affect.   LABORATORY DATA:  EKG:  EKG is ordered today. This demonstrates NSR with 1st degree AV block. HR is 62.   Lab Results  Component Value Date   WBC 7.4 10/11/2016   HGB 12.5 10/11/2016   HCT 37.6 10/11/2016   PLT 247 10/11/2016   GLUCOSE 81 10/11/2016   CHOL 188 05/17/2016   TRIG 110 05/17/2016   HDL 56 05/17/2016   LDLCALC 110 05/17/2016   ALT 12 05/17/2016   AST 22 05/17/2016   NA 131 (L) 10/11/2016   K 3.9 10/11/2016   CL 95 (L) 10/11/2016   CREATININE 1.18 (H) 10/11/2016   BUN 17 10/11/2016   CO2 25 10/11/2016   TSH 1.00 10/11/2016   INR 2.0 02/13/2017   HGBA1C (H) 07/10/2010    5.8 (NOTE)                                                                       According to the ADA Clinical Practice Recommendations for 2011, when HbA1c is used as a screening test:   >=6.5%   Diagnostic of Diabetes Mellitus           (if abnormal result  is confirmed)  5.7-6.4%    Increased risk of developing Diabetes Mellitus  References:Diagnosis and Classification of Diabetes Mellitus,Diabetes [id number] 1):S62-S69 and Standards of Medical Care in         Diabetes - 2011,Diabetes Care,2011,34  (Suppl 1):S11-S61.     Lab Results  Component Value Date   INR 2.0 02/13/2017   INR 1.5 01/30/2017   INR 2.1 01/09/2017    BNP (last 3 results) No results for input(s): BNP in the last 8760 hours.  ProBNP (last 3 results) No results for input(s): PROBNP in the last 8760 hours.   Other Studies Reviewed Today:  48 Hour HolterStudy Highlights 09/2016    Rare PACs premature atrial contractions, 22  Average heart rate 70 bpm  No pauses  No adverse arrhythmias  No atrial fibrillation during this 48 hour monitoring.   Candee Furbish, MD     GXT Study Highlights 10/2016    Blood pressure demonstrated a hypertensive response to exercise.  Clinically negative, electrically nondiagnostic for ischemia as pt only achieved 65% maximal HR For HR achieved there were no ST changes to suggest ischemia.       Assessment/Plan:  1. PAF - managed with chronic Flecainide therapy and continued anticoagulation. She remains in sinus. I am stopping CCB due to dizziness - first she said she was not taking anyway - but then says she is. Her dose of metoprolol is questionable as well. Needs meds verified.   2.  Chronic fatigue - beta blocker previously cut back - not really discussed today. Her dizziness seems to be more of an issue.   3. Chronic coumadin anticoagulation - she is bridged for procedures due to prior embolic stroke when off. INR today. No bleeding complications noted.   4. HTN - BP fair. I am stopping her CCB today. Will follow.  5. Neck mass - probable goiter - has not seen PCP - will check lab today. Encouraged her to keep her follow up with PCP.   Current medicines are reviewed with the patient today.  The patient does not have concerns  regarding medicines other than what has been noted above.  The following changes have been made:  See above.  Labs/ tests ordered today include:    Orders Placed This Encounter  Procedures  . Basic metabolic panel  . CBC  . TSH  . EKG 12-Lead     Disposition:   FU with Dr. Marlou Porch in 6 weeks - asked to bring medicine bottles in with her.   Patient is agreeable to this plan and will call if any problems develop in the interim.   SignedTruitt Merle, NP  02/27/2017 10:36 AM  [place] [address] [place], [place]  [address] Phone: [phone] Fax: [phone]

## 2017-02-27 ENCOUNTER — Ambulatory Visit (INDEPENDENT_AMBULATORY_CARE_PROVIDER_SITE_OTHER): Payer: Medicare Other | Admitting: Nurse Practitioner

## 2017-02-27 ENCOUNTER — Ambulatory Visit (INDEPENDENT_AMBULATORY_CARE_PROVIDER_SITE_OTHER): Payer: Medicare Other | Admitting: *Deleted

## 2017-02-27 ENCOUNTER — Encounter: Payer: Self-pay | Admitting: Nurse Practitioner

## 2017-02-27 VITALS — BP 136/90 | HR 62 | Ht 66.0 in | Wt 119.8 lb

## 2017-02-27 DIAGNOSIS — E7800 Pure hypercholesterolemia, unspecified: Secondary | ICD-10-CM

## 2017-02-27 DIAGNOSIS — E049 Nontoxic goiter, unspecified: Secondary | ICD-10-CM

## 2017-02-27 DIAGNOSIS — Z5181 Encounter for therapeutic drug level monitoring: Secondary | ICD-10-CM

## 2017-02-27 DIAGNOSIS — I480 Paroxysmal atrial fibrillation: Secondary | ICD-10-CM | POA: Diagnosis not present

## 2017-02-27 DIAGNOSIS — I4891 Unspecified atrial fibrillation: Secondary | ICD-10-CM | POA: Diagnosis not present

## 2017-02-27 DIAGNOSIS — I119 Hypertensive heart disease without heart failure: Secondary | ICD-10-CM

## 2017-02-27 LAB — BASIC METABOLIC PANEL
BUN/Creatinine Ratio: 19 (ref 12–28)
BUN: 19 mg/dL (ref 8–27)
CO2: 23 mmol/L (ref 18–29)
Calcium: 9.3 mg/dL (ref 8.7–10.3)
Chloride: 99 mmol/L (ref 96–106)
Creatinine, Ser: 0.98 mg/dL (ref 0.57–1.00)
GFR calc Af Amer: 63 mL/min/{1.73_m2} (ref >59)
GFR calc non Af Amer: 55 mL/min/{1.73_m2} — ABNORMAL LOW (ref >59)
Glucose: 63 mg/dL — ABNORMAL LOW (ref 65–99)
Potassium: 3.9 mmol/L (ref 3.5–5.2)
Sodium: 138 mmol/L (ref 134–144)

## 2017-02-27 LAB — CBC
Hematocrit: 39.5 % (ref 34.0–46.6)
Hemoglobin: 13.2 g/dL (ref 11.1–15.9)
MCH: 31.4 pg (ref 26.6–33.0)
MCHC: 33.4 g/dL (ref 31.5–35.7)
MCV: 94 fL (ref 79–97)
Platelets: 216 10*3/uL (ref 150–379)
RBC: 4.21 x10E6/uL (ref 3.77–5.28)
RDW: 14.3 % (ref 12.3–15.4)
WBC: 6.2 10*3/uL (ref 3.4–10.8)

## 2017-02-27 LAB — TSH: TSH: 1.16 u[IU]/mL (ref 0.450–4.500)

## 2017-02-27 LAB — POCT INR: INR: 2.2

## 2017-02-27 NOTE — Patient Instructions (Addendum)
We will be checking the following labs today - BMET, CBC and TSH   Medication Instructions:    Continue with your current medicines. BUT  I am stopping Diltiazem  Make sure this med list matches up with what you are taking at home  Verify your dose of Metoprolol and let us know    Testing/Procedures To Be Arranged:  N/A  Follow-Up:   See Dr. Marlou Porch in about 6 weeks.   Bring all your actual medicine bottles with you to your visit    Other Special Instructions:   N/A    If you need a refill on your cardiac medications before your next appointment, please call your pharmacy.   Call the [place] office at [phone] if you have any questions, problems or concerns.

## 2017-02-28 ENCOUNTER — Telehealth: Payer: Self-pay | Admitting: Nurse Practitioner

## 2017-02-28 ENCOUNTER — Telehealth: Payer: Self-pay | Admitting: Family Medicine

## 2017-02-28 NOTE — Telephone Encounter (Signed)
Pt called and states that she can not go with out taking the Ambien she wakes up just about 2 hours, and she needs this to help her sleep, she went with out 3 nights but not 3 nights in a row, she waited until she was good a tired with out taking it to see if she could go with out, she states that she is going to call her pharmacy today to get a refill cause she only has 4 left, pt can be reached at [phone]

## 2017-02-28 NOTE — Telephone Encounter (Signed)
Not really sure why she needed to leave this message, when she hasn't even called for the refill yet.  Okay to refill x 90d when you get the refill request

## 2017-02-28 NOTE — Telephone Encounter (Signed)
Danielle,  Will you make sure our record reflects this. She may continue.

## 2017-02-28 NOTE — Telephone Encounter (Signed)
New message    Pt states that she cuts in half the metoprolol (LOPRESSOR) 25 MG tablet pill and takes 1 in the morning and 1 at night. She states that she was told to let someone know today how much she is taking. She wants clarification.

## 2017-02-28 NOTE — Telephone Encounter (Signed)
Patient calling to let Truitt Merle, NP know how much of the metoprolol she is taking. The patient states that she still has a lot of the 50 mg pills so she has been cutting them in half and taking 1/2 pill (25 mg) BID for a total of 50 mg daily. Patient advised that message would be forwarded to Truitt Merle, NP for review and if any changes are needed we would call her back. Patient verbalized understanding.

## 2017-03-01 ENCOUNTER — Telehealth: Payer: Self-pay | Admitting: Gastroenterology

## 2017-03-01 ENCOUNTER — Other Ambulatory Visit: Payer: Self-pay | Admitting: *Deleted

## 2017-03-01 NOTE — Telephone Encounter (Signed)
Burning uncomfortable at her rectum. She has 3 Anusol HC suppositories. She will use them nightly until she hears back. There is no blood with her bowel movement and she is "not howling in pain". "Just a burning sensation". Please advise on banding # 3. She take Coumadin.

## 2017-03-04 ENCOUNTER — Other Ambulatory Visit: Payer: Self-pay

## 2017-03-04 MED ORDER — HYDROCORTISONE ACETATE 25 MG RE SUPP: 25.0000 mg | Freq: Every day | RECTAL | 0 refills | Status: DC

## 2017-03-04 NOTE — Telephone Encounter (Signed)
Spoke with patient and she does not need refill yet-she will call when needed.

## 2017-03-04 NOTE — Telephone Encounter (Signed)
Discussed with the patient. Confirmed her appointment for 04/09/17 at this time.

## 2017-03-04 NOTE — Telephone Encounter (Signed)
Continue Anusol as needed. Will do the 3rd banding as scheduled but if has no improvement despite that, will not recommend any further hemorrhoidal band ligation. Please advise patient to avoid excessive hygiene and keep the peri anal and anal area dry.

## 2017-03-05 ENCOUNTER — Ambulatory Visit (INDEPENDENT_AMBULATORY_CARE_PROVIDER_SITE_OTHER): Payer: Medicare Other | Admitting: Gastroenterology

## 2017-03-05 ENCOUNTER — Encounter: Payer: Self-pay | Admitting: Gastroenterology

## 2017-03-05 ENCOUNTER — Ambulatory Visit: Payer: Medicare Other | Admitting: Gastroenterology

## 2017-03-05 ENCOUNTER — Telehealth: Payer: Self-pay | Admitting: Gastroenterology

## 2017-03-05 VITALS — BP 120/88 | HR 64 | Ht 65.0 in | Wt 117.0 lb

## 2017-03-05 DIAGNOSIS — F411 Generalized anxiety disorder: Secondary | ICD-10-CM

## 2017-03-05 DIAGNOSIS — K644 Residual hemorrhoidal skin tags: Secondary | ICD-10-CM

## 2017-03-05 DIAGNOSIS — K6289 Other specified diseases of anus and rectum: Secondary | ICD-10-CM | POA: Diagnosis not present

## 2017-03-05 DIAGNOSIS — Z7901 Long term (current) use of anticoagulants: Secondary | ICD-10-CM

## 2017-03-05 NOTE — Patient Instructions (Signed)
We will refer you to Dr Michael Boston at [facility] for removal of skin tags   Follow up as needed

## 2017-03-05 NOTE — Telephone Encounter (Signed)
Patient says "it is terribly red down there". A lot of discomfort. Agrees to come this afternoon to see Dr Silverio Decamp. Patient is on Coumadin.

## 2017-03-05 NOTE — Progress Notes (Addendum)
Lisa Lucero    LN:[id number]    04/05/1935  Primary Care Physician:KNAPP,EVE A, MD  Referring Physician: Rita Ohara, MD [address] [place], [place] [address]  Chief complaint:  Anal pain  HPI: 82 year old female here with complaints of anal pain that is worse in the past 2 days. She had called last week with similar complaint, anal pain associated with burning and when she tried to visualize the area with the mirror she saw red spot. Denies any blood per rectum with bowel movement or when she wipes after a bowel movement. Denies any pain when she defecates. She also complains that she felt a growth that came on about 2 days ago, is burning with intermittent pain and she is having difficulty sitting.  No change in bowel habits and denies constipation. She had complained of intermittent rectal pain associated with swelling of hemorrhoids and small-volume bright red blood per rectum that was thought to be secondary to internal hemorrhoid hemorrhage at the time in September 2017 when I saw the patient for the first time after she transferred her Care from Dr [facility]. She underwent internal hemorrhoid band ligationX 2, no longer having any bright red blood per rectum. She had EGD and colonoscopy in 2011 with no polyps. Unremarkable except for mild erosive duodenitis   Outpatient Encounter Prescriptions as of 03/05/2017  Medication Sig  . AMBULATORY NON FORMULARY MEDICATION Medication Name: Nitroglycerin ointment 0.125% Insert pea sized amount into rectum 3-4 times a day as needed  . calcium carbonate (TUMS - DOSED IN MG ELEMENTAL CALCIUM) 500 MG chewable tablet Chew 1 tablet by mouth 2 (two) times daily.  Marland Kitchen FERREX 150 150 MG capsule TAKE ONE CAPSULE BY MOUTH EVERY DAY  . flecainide (TAMBOCOR) 50 MG tablet Take 1 tablet (50 mg total) by mouth 2 (two) times daily.  Marland Kitchen gabapentin (NEURONTIN) 100 MG capsule TAKE 2 CAPSULES TWICE A DAY  . hydrochlorothiazide (HYDRODIURIL)  25 MG tablet Take 0.5 tablets (12.5 mg total) by mouth daily.  . hydrocortisone (ANUSOL-HC) 25 MG suppository Place 1 suppository (25 mg total) rectally at bedtime.  Marland Kitchen levothyroxine (SYNTHROID, LEVOTHROID) 25 MCG tablet TAKE 1 TABLET DAILY BEFORE BREAKFAST  . lisinopril (PRINIVIL,ZESTRIL) 20 MG tablet Take 2 tablets (40 mg total) by mouth daily.  . Magnesium 400 MG CAPS Take 1 tablet by mouth daily.  . metoprolol (LOPRESSOR) 25 MG tablet Take 1 tablet (25 mg total) by mouth 2 (two) times daily.  . Multiple Vitamin (MULITIVITAMIN WITH MINERALS) TABS Take 1 tablet by mouth daily.  . potassium chloride (K-DUR,KLOR-CON) 10 MEQ tablet Take 10 mEq by mouth 2 (two) times daily.  . pravastatin (PRAVACHOL) 20 MG tablet TAKE 1 TABLET BY MOUTH EVERY EVENING  . warfarin (COUMADIN) 5 MG tablet TAKE AS DIRECTED BY COUMADIN CLINIC  . zolpidem (AMBIEN CR) 6.25 MG CR tablet TAKE 1 TABLET BY MOUTH AT BEDTIME   No facility-administered encounter medications on file as of 03/05/2017.     Allergies as of 03/05/2017 - Review Complete 03/05/2017  Allergen Reaction Noted  . Celebrex [celecoxib] Diarrhea 10/22/2014  . Lipitor [atorvastatin calcium] Other (See Comments) 05/17/2011    Past Medical History:  Diagnosis Date  . Anemia    Iron deficiency anemia  . Arthritis   . Arthropathy   . Back pain   . Blood in stool   . Chronic kidney disease   . COPD (chronic obstructive pulmonary disease) ([place])  pt denies on 09/27/14  . CVA (cerebral vascular accident) ([place])   . Erosive gastritis    with GI bleed thought due to elevated INR and duodenitis  . External hemorrhoids without complication   . GERD (gastroesophageal reflux disease)   . H/O hypercholesterolemia    PMH: only  . H/O transfusion of packed red blood cells   . H/O: GI bleed   . Hemorrhage of rectum and anus   . History of embolic stroke Q000111Q  . HTN (hypertension)   . Hyperthyroidism   . Hypothyroidism   . Hypothyroidism   . Insomnia     prev tried Trazadone (2014); ambien CR caused hangover; on ambien since 11/2013  . Iron deficiency anemia due to chronic blood loss   . Long-term (current) use of anticoagulants   . Osteoarthritis of both knees   . Osteoarthritis of lumbar spine   . Osteoporosis   . Paroxysmal atrial fibrillation (HCC)   . Peripheral edema   . Postmenopausal   . Primary osteoarthritis of both hips   . Pyuria   . Stroke [facility])     Past Surgical History:  Procedure Laterality Date  . ABDOMINAL HYSTERECTOMY    . BREAST ENHANCEMENT SURGERY    . CARDIOVERSION N/A 12/09/2015   Procedure: CARDIOVERSION;  Surgeon: Jerline Pain, MD;  Location: [place];  Service: Cardiovascular;  Laterality: N/A;  . CHOLECYSTECTOMY  03/11/2012   Procedure: LAPAROSCOPIC CHOLECYSTECTOMY WITH INTRAOPERATIVE CHOLANGIOGRAM;  Surgeon: Joyice Faster. Cornett, MD;  Location: [place];  Service: General;  Laterality: N/A;  . CHOLECYSTECTOMY    . COLONOSCOPY    . CYST EXCISION  11/2014   left chest wall--nodular hidroadenoma  . HEMORRHOID SURGERY    . LUMBAR LAMINECTOMY N/A 09/29/2014   Procedure: L4-5 Decompression, Hemi-Laminectomy,  Removal Free Fragment;  Surgeon: Marybelle Killings, MD;  Location: [place];  Service: Orthopedics;  Laterality: N/A;  . OPERATIVE HYSTEROSCOPY    . ORIF PATELLA Left 10/25/2014   Procedure: OPEN REDUCTION INTERNAL (ORIF) FIXATION LEFT PATELLA;  Surgeon: Marybelle Killings, MD;  Location: [place];  Service: Orthopedics;  Laterality: Left;  . TEE WITHOUT CARDIOVERSION N/A 12/09/2015   Procedure: TRANSESOPHAGEAL ECHOCARDIOGRAM (TEE);  Surgeon: Jerline Pain, MD;  Location: [facility] ENDOSCOPY;  Service: Cardiovascular;  Laterality: N/A;  . WISDOM TOOTH EXTRACTION      Family History  Problem Relation Age of Onset  . Stroke Mother   . Dementia Father   . Hypertension    . Arthritis    . Cancer Brother     lung  . Heart attack Neg Hx     Social History   Social History  . Marital status: Married    Spouse name: N/A  .  Number of children: N/A  . Years of education: N/A   Occupational History  . Not on file.   Social History Main Topics  . Smoking status: Former Smoker    Types: Cigarettes    Quit date: 09/30/2010  . Smokeless tobacco: Never Used  . Alcohol use No  . Drug use: No  . Sexual activity: Not Currently   Other Topics Concern  . Not on file   Social History Narrative   Married, lives with husband.  2 children--one in [place] and one in [place] (daughters)      Review of systems: Review of Systems  Constitutional: Negative for fever and chills.  HENT: Negative.   Eyes: Negative for blurred vision.  Respiratory: Negative for cough, shortness  of breath and wheezing.   Cardiovascular: Negative for chest pain and palpitations.  Gastrointestinal: as per HPI Genitourinary: Negative for dysuria, urgency, frequency and hematuria.  Musculoskeletal: Negative for myalgias, back pain and joint pain.  Skin: Negative for itching and rash.  Neurological: Negative for dizziness, tremors, focal weakness, seizures and loss of consciousness.  Endo/Heme/Allergies: Positive for seasonal allergies.  Psychiatric/Behavioral: Negative for depression, suicidal ideas and hallucinations.  All other systems reviewed and are negative.   Physical Exam: Vitals:   03/05/17 1530  BP: 120/88  Pulse: 64   Body mass index is 19.47 kg/m. Gen:      No acute distress HEENT:  EOMI, sclera anicteric Neck:     No masses; no thyromegaly Lungs:    Clear to auscultation bilaterally; normal respiratory effort CV:         Regular rate and rhythm; no murmurs Abd:      + bowel sounds; soft, non-tender; no palpable masses, no distension Ext:    No edema; adequate peripheral perfusion Skin:      Warm and dry; no rash Neuro: alert and oriented x 3 Psych: normal mood and affect Rectal exam: Normal anal sphincter tone, and no anal fissure . Multiple skin tags ,  Noted 2  anteriorly that are extremely tender to  touch , no visible ulcer or thrombosed hemorrhoids  Data Reviewed:  Reviewed labs, radiology imaging, old records and pertinent past GI work up   Assessment and Plan/Recommendations:  82 year old female with history of atrial fibrillation on Coumadin, CKD, COPD , CVA , here with complaints of peri anal pain  She underwent internal hemorrhoidal band ligation x 2 on 12/20/16 and 01/22/16, no longer having any bright red blood per rectum  Doesn't have any pain with defecation, no evidence of anal fissure or fistula  Patient has tenderness near the external skin tags, unclear why she has tenderness as did not find any pathology on exam. I reassured patient that I did not see any growth, nodule or fistula. Patient persistently requested to have the skin tags removed, I explained in detail to her that I cannot remove them and she may have to undergo surgery to excise them; also discussed in detail that the risks will outweigh the benefit, but she feels given her pain she would like to proceed with excision of the skin tags  Advise patient to avoid excessive hygiene and keep the perianal area dry We will not do any further banding of internal hemorrhoids as she is currently asymptomatic from them  No follow-up scheduled   We will refer to Dr. Michael Boston [facility] for further evaluation for possible excision of anal skin tags    25 minutes was spent face-to-face with the patient. Greater than 50% of the time used for counseling as well as treatment plan and follow-up. She had multiple questions which were answered to her satisfaction  Damaris Hippo , MD [phone] Mon-Fri 8a-5p [phone] after 5p, weekends, holidays  CC: Rita Ohara, MD

## 2017-03-06 ENCOUNTER — Other Ambulatory Visit: Payer: Self-pay | Admitting: *Deleted

## 2017-03-06 DIAGNOSIS — K644 Residual hemorrhoidal skin tags: Secondary | ICD-10-CM

## 2017-03-14 ENCOUNTER — Other Ambulatory Visit: Payer: Self-pay | Admitting: Gastroenterology

## 2017-03-14 NOTE — Telephone Encounter (Signed)
Patient states that she is out of this medication and is requesting this refill today.

## 2017-03-18 ENCOUNTER — Telehealth: Payer: Self-pay | Admitting: Gastroenterology

## 2017-03-18 NOTE — Telephone Encounter (Signed)
Patient was told we would refer her. Records faxed to [facility] for this.

## 2017-03-21 ENCOUNTER — Other Ambulatory Visit: Payer: Self-pay

## 2017-03-21 ENCOUNTER — Other Ambulatory Visit: Payer: Self-pay | Admitting: Cardiology

## 2017-03-21 DIAGNOSIS — I4891 Unspecified atrial fibrillation: Secondary | ICD-10-CM

## 2017-03-21 DIAGNOSIS — E039 Hypothyroidism, unspecified: Secondary | ICD-10-CM

## 2017-03-21 MED ORDER — FLECAINIDE ACETATE 50 MG PO TABS: 50.0000 mg | ORAL_TABLET | Freq: Two times a day (BID) | ORAL | 9 refills | Status: AC

## 2017-03-25 ENCOUNTER — Other Ambulatory Visit: Payer: Self-pay | Admitting: Gastroenterology

## 2017-03-27 ENCOUNTER — Ambulatory Visit (INDEPENDENT_AMBULATORY_CARE_PROVIDER_SITE_OTHER): Payer: Medicare Other | Admitting: *Deleted

## 2017-03-27 DIAGNOSIS — Z5181 Encounter for therapeutic drug level monitoring: Secondary | ICD-10-CM

## 2017-03-27 DIAGNOSIS — I4891 Unspecified atrial fibrillation: Secondary | ICD-10-CM

## 2017-03-27 LAB — POCT INR: INR: 2.7

## 2017-03-28 ENCOUNTER — Other Ambulatory Visit: Payer: Self-pay | Admitting: Family Medicine

## 2017-03-28 ENCOUNTER — Encounter: Payer: Self-pay | Admitting: *Deleted

## 2017-03-28 NOTE — Telephone Encounter (Signed)
Appointment with Dr Johney Maine scheduled on 04/08/2017 at 1:45pm   [facility] informed pt left voicemail

## 2017-04-08 ENCOUNTER — Ambulatory Visit: Payer: Self-pay | Admitting: Surgery

## 2017-04-08 DIAGNOSIS — Z01818 Encounter for other preprocedural examination: Secondary | ICD-10-CM | POA: Diagnosis not present

## 2017-04-08 DIAGNOSIS — Z7901 Long term (current) use of anticoagulants: Secondary | ICD-10-CM | POA: Diagnosis not present

## 2017-04-08 DIAGNOSIS — K641 Second degree hemorrhoids: Secondary | ICD-10-CM | POA: Diagnosis not present

## 2017-04-08 DIAGNOSIS — K644 Residual hemorrhoidal skin tags: Secondary | ICD-10-CM | POA: Diagnosis not present

## 2017-04-08 NOTE — H&P (Signed)
Lisa Lucero 04/08/2017 1:52 PM Location: [facility] Patient #: [id number] DOB: 1935-01-16 Married / Language: English / Race: White Female  History of Present Illness Lisa Hector MD; 04/08/2017 2:44 PM) The patient is a 82 year old female who presents with anal lesions. Note for "Anal lesions": ` ` ` Patient sent for surgical consultation the request of her gastroenterologist, Dr. Silverio Decamp. Concern for paced painful anal tags/external hemorrhoid  82 year old females who struggle with constipation. She's had some intermittent hemorrhoid issues usually managed with hemorrhoidal banding's. Was followed by Dr. Ernest Haber with [facility] gastroenterology. Transition care to [facility] gastroenterology. Had internal hemorrhoidal bandings last year 2. However she notes that she has persistent anal pain and irritation. She has some anal tags bothering her for almost a year. Stinging is constant. Usually worse after she is walking her usual 1-2 mile walk. Cannot sit for prolonged periods of time. Not particularly bothersome to the bowel movements. Occasionally feels something falling out but not out all the time. He moves her bowels about every other day and needs to take MiraLAX or laxatives to help things move. She does not recall having a colonoscopy recently denies any history of any other bowel issues.  No personal nor family history of GI/colon cancer, inflammatory bowel disease, irritable bowel syndrome, allergy such as Celiac Sprue, dietary/dairy problems, colitis, ulcers nor gastritis. No recent sick contacts/gastroenteritis. No travel outside the country. No changes in diet. No dysphagia to solids or liquids. No significant heartburn or reflux. No hematochezia, hematemesis, coffee ground emesis. No evidence of prior gastric/peptic ulceration.  She is fully anticoagulated on warfarin and followed by Dr. Luther Parody. She believe she had a mini stroke in the past 5 years. Goals  needing a Lovenox bridge after orthopedic surgery by Dr. Lorin Mercy. She comes today by herself.   Past Surgical History Illene Lucero, CMA; 04/08/2017 1:52 PM) Gallbladder Surgery - Laparoscopic Hysterectomy (not due to cancer) - Partial Knee Surgery Left. Spinal Surgery - Lower Back  Diagnostic Studies History Illene Lucero, CMA; 04/08/2017 1:52 PM) Mammogram >3 years ago Pap Smear >5 years ago  Allergies Illene Lucero, CMA; 04/08/2017 1:53 PM) CeleBREX *ANALGESICS - ANTI-INFLAMMATORY* Lipitor *ANTIHYPERLIPIDEMICS*  Medication History (Lisa Lucero, CMA; 04/08/2017 1:58 PM) Zolpidem Tartrate ER (6.25MG  Tablet ER, Oral) Active. Warfarin Sodium (5MG  Tablet, Oral) Active. Pravastatin Sodium (20MG  Tablet, Oral) Active. Potassium Chloride Crys ER (10MEQ Tablet ER, Oral) Active. Lisinopril (20MG  Tablet, Oral) Active. Synthroid (25MCG Tablet, Oral) Active. Hydrocortisone Acetate (25MG  Suppository, Rectal) Active. HydroCHLOROthiazide (25MG  Tablet, Oral) Active. Gabapentin (100MG  Capsule, Oral) Active. Flecainide Acetate (50MG  Tablet, Oral) Active. Ferrex 150 (150MG  Capsule, Oral) Active. Tums (500MG  Tablet Chewable, Oral) Active. Magnesium (300MG  Capsule, Oral) Active. Metoprolol Succinate ER (25MG  Tablet ER 24HR, Oral) Active. Multivitamin Adults (Oral) Active. Medications Reconciled  Social History Illene Lucero, CMA; 04/08/2017 1:52 PM) Caffeine use Coffee. No alcohol use No drug use Tobacco use Former smoker.  Family History Illene Lucero, CMA; 04/08/2017 1:52 PM) Diabetes Mellitus Family Members In General.  Pregnancy / Birth History Illene Lucero, [place]; 04/08/2017 1:52 PM) Age at menarche 85 years. Gravida 2 Length (months) of breastfeeding 3-6 Maternal age 66-25 Para 2  Other Problems Illene Lucero, CMA; 04/08/2017 1:52 PM) Atrial Fibrillation Back Pain Cerebrovascular Accident Hemorrhoids High blood  pressure Hypercholesterolemia Thyroid Disease     Review of Systems (Lisa Lucero CMA; 04/08/2017 1:52 PM) General Not Present- Appetite Loss, Chills, Fatigue, Fever, Night Sweats, Weight Gain and Weight Loss. Skin Not Present- Change in Wart/Mole, Dryness, Hives,  Jaundice, New Lesions, Non-Healing Wounds, Rash and Ulcer. HEENT Not Present- Earache, Hearing Loss, Hoarseness, Nose Bleed, Oral Ulcers, Ringing in the Ears, Seasonal Allergies, Sinus Pain, Sore Throat, Visual Disturbances, Wears glasses/contact lenses and Yellow Eyes. Respiratory Not Present- Bloody sputum, Chronic Cough, Difficulty Breathing, Snoring and Wheezing. Breast Not Present- Breast Mass, Breast Pain, Nipple Discharge and Skin Changes. Cardiovascular Not Present- Chest Pain, Difficulty Breathing Lying Down, Leg Cramps, Palpitations, Rapid Heart Rate, Shortness of Breath and Swelling of Extremities. Gastrointestinal Present- Hemorrhoids and Rectal Pain. Not Present- Abdominal Pain, Bloating, Bloody Stool, Change in Bowel Habits, Chronic diarrhea, Constipation, Difficulty Swallowing, Excessive gas, Gets full quickly at meals, Indigestion, Nausea and Vomiting. Female Genitourinary Not Present- Frequency, Nocturia, Painful Urination, Pelvic Pain and Urgency. Musculoskeletal Not Present- Back Pain, Joint Pain, Joint Stiffness, Muscle Pain, Muscle Weakness and Swelling of Extremities. Neurological Not Present- Decreased Memory, Fainting, Headaches, Numbness, Seizures, Tingling, Tremor, Trouble walking and Weakness. Psychiatric Not Present- Anxiety, Bipolar, Change in Sleep Pattern, Depression, Fearful and Frequent crying. Endocrine Not Present- Cold Intolerance, Excessive Hunger, Hair Changes, Heat Intolerance, Hot flashes and New Diabetes. Hematology Present- Blood Thinners. Not Present- Easy Bruising, Excessive bleeding, Gland problems, HIV and Persistent Infections.  Vitals (Lisa Lucero CMA; 04/08/2017 1:53 PM) 04/08/2017  1:52 PM Weight: 117 lb Height: 66in Body Surface Area: 1.59 m Body Mass Index: 18.88 kg/m  Pulse: 62 (Regular)  BP: 104/74 (Sitting, Left Arm, Standard)     Results Lisa Hector MD; 04/08/2017 2:26 PM) Procedures  Name Value Date Hemorrhoids Procedure Anal exam: External Hemorrhoid Bleeding Skin tag Internal exam: Internal Hemorroids ( non-bleeding) Internal Hemorrhoids (bleeding) Other: Right anterior and left anterior external hemorrhoids. Suspicious for probable grade 4 internal hemorrhoids with external component. Some irritation and friability. Lateral internal hemorrhoid with some friability as well. At least grade 2. Possibly grade 3. Perianal skin clean with good hygiene. He uses clear topical cream ?Vaseline No pruritis ani. No pilonidal disease. No fissure. No abscess/fistula. Normal sphincter tone.  No condyloma warts. Tolerates digital and anoscopic rectal exam. No rectal masses. Exam done with assistance of female Medical Assistant in the room.  Performed: 04/08/2017 2:21 PM    Assessment & Plan Lisa Hector MD; 04/08/2017 2:26 PM)  EXTERNAL HEMORRHOIDS WITH COMPLICATION (H60.7) Impression: Irritating external hemorrhoids with some possible grade 3 prolapsing at least grade 2 internal hemorrhoids with occasional bleeding as well. Worsening symptoms of itching and irritation. Cannot walk or sit for any medical. Time. It is affecting quality of life. He has been refractory to banding since the past year.  I recommended examination under anesthesia with internal hemorrhoid ligation and external hemorrhoidectomy. She is interested in proceeding.  Would need to hold anticoagulation perioperatively.  I strongly recommend she get on a fiber bowel regimen and take it daily to help override her moderate constipation that is making her hemorrhoids gradually worsen.  The anatomy & physiology of the anorectal region was discussed. The  pathophysiology of hemorrhoids and differential diagnosis was discussed. Natural history progression was discussed. I stressed the importance of a bowel regimen to have daily soft bowel movements to minimize progression of disease. Goal of one BM / day ideal. Use of wet wipes, warm baths, avoiding straining, etc were emphasized.  Educational handouts further explaining the pathology, treatment options, and bowel regimen were given as well. The patient expressed understanding.  Current Plans Pt Education - [facility] Hemorrhoids (Arrayah Connors): discussed with patient and provided information. Pt Education - Pamphlet Given - The Hemorrhoid Book:  discussed with patient and provided information. PROLAPSED INTERNAL HEMORRHOIDS, GRADE 2 (K64.1) Impression: At least grade 2 internal hemorrhoids, especially left lateral. Would benefit from internal hemorrhoid and ligation and pexing.  Current Plans ANOSCOPY, DIAGNOSTIC (00712) ENCOUNTER FOR PREOPERATIVE EXAMINATION FOR GENERAL SURGICAL PROCEDURE (Z01.818)  Current Plans You are being scheduled for surgery- Our schedulers will call you.  You should hear from our office's scheduling department within 5 working days about the location, date, and time of surgery. We try to make accommodations for patient's preferences in scheduling surgery, but sometimes the OR schedule or the surgeon's schedule prevents [place] from making those accommodations.  If you have not heard from our office [phone]) in 5 working days, call the office and ask for your surgeon's nurse.  If you have other questions about your diagnosis, plan, or surgery, call the office and ask for your surgeon's nurse.  The anatomy and the physiology was discussed. The pathophysiology and natural history of the disease was discussed. Options were discussed and recommendations were made. Technique, risks, benefits, & alternatives were discussed. Risks such as stroke, heart attack, bleeding, indection,  death, and other risks discussed. Questions answered. The patient agrees to proceed. Pt Education - [facility] Rectal Prep for Anorectal outpatient/office surgery: discussed with patient and provided information. Pt Education - [facility] Rectal Surgery HCI (Peni Rupard): discussed with patient and provided information. Pt Education - [place] (Jonica Bickhart) ANTICOAGULATED (Z79.01) Impression: Fully anticoagulated on warfarin for atrial fibrillation with a partial history of a TIA or at least a stroke.  I would need her to hold her anticoagulation perioperatively. We'll recheck to her cardiologist and see if Dr. Ethlyn Daniels feel it is safe to come off it. She recalls needing a Lovenox bridge after orthopedic surgery. We'll see if it is needed again.  Current Plans I recommended obtaining preoperative cardiac clearance. I am concerned about the health of the patient and the ability to tolerate the operation. Therefore, we will request clearance by cardiology to better assess operative risk & see if a reevaluation, further workup, etc is needed. Also recommendations on how medications such as for anticoagulation and blood pressure should be managed/held/restarted after surgery. Pt Education - [facility] Hold anticoagulation preoperatively

## 2017-04-09 ENCOUNTER — Encounter: Payer: Medicare Other | Admitting: Gastroenterology

## 2017-04-10 ENCOUNTER — Telehealth: Payer: Self-pay | Admitting: Gastroenterology

## 2017-04-11 NOTE — Telephone Encounter (Signed)
Spoke with Dr Silverio Decamp about this. She wants the patient to direct these questions to the surgeon, Dr Johney Maine. Called the patient back but got her voicemail. I left her a message stating this information. DPR on file.

## 2017-04-12 ENCOUNTER — Ambulatory Visit (INDEPENDENT_AMBULATORY_CARE_PROVIDER_SITE_OTHER): Payer: Medicare Other | Admitting: *Deleted

## 2017-04-12 ENCOUNTER — Encounter: Payer: Self-pay | Admitting: Cardiology

## 2017-04-12 ENCOUNTER — Ambulatory Visit (INDEPENDENT_AMBULATORY_CARE_PROVIDER_SITE_OTHER): Payer: Medicare Other | Admitting: Cardiology

## 2017-04-12 VITALS — BP 124/70 | HR 74 | Ht 66.0 in | Wt 117.2 lb

## 2017-04-12 DIAGNOSIS — E7800 Pure hypercholesterolemia, unspecified: Secondary | ICD-10-CM | POA: Diagnosis not present

## 2017-04-12 DIAGNOSIS — E049 Nontoxic goiter, unspecified: Secondary | ICD-10-CM | POA: Diagnosis not present

## 2017-04-12 DIAGNOSIS — I480 Paroxysmal atrial fibrillation: Secondary | ICD-10-CM | POA: Diagnosis not present

## 2017-04-12 DIAGNOSIS — I119 Hypertensive heart disease without heart failure: Secondary | ICD-10-CM | POA: Diagnosis not present

## 2017-04-12 DIAGNOSIS — Z5181 Encounter for therapeutic drug level monitoring: Secondary | ICD-10-CM

## 2017-04-12 DIAGNOSIS — I4891 Unspecified atrial fibrillation: Secondary | ICD-10-CM

## 2017-04-12 LAB — POCT INR: INR: 2.2

## 2017-04-12 NOTE — Patient Instructions (Signed)
Medication Instructions:  The current medical regimen is effective;  continue present plan and medications.  Follow-Up: Follow up in 6 months with Lori Gerhardt, NP.  You will receive a letter in the mail 2 months before you are due.  Please call us when you receive this letter to schedule your follow up appointment.  Follow up in 1 year with Dr. Skains.  You will receive a letter in the mail 2 months before you are due.  Please call us when you receive this letter to schedule your follow up appointment.  If you need a refill on your cardiac medications before your next appointment, please call your pharmacy.  Thank you for choosing [facility]!!     

## 2017-04-12 NOTE — Progress Notes (Signed)
Cardiology Office Note    Date:  04/12/2017   ID:  Lisa Lucero, DOB [birth year], MRN [id number]  PCP:  Vikki Ports, MD  Cardiologist:   Candee Furbish, MD     History of Present Illness:  Lisa Lucero is a 82 y.o. female former patient of Dr. Sherryl Barters here for office follow-up  History of paroxysmal atrial fibrillation last hospitalized in March 2010 on long-term Coumadin for anticoagulation. Previously, she had gone off of Coumadin and was maintaining sinus rhythm when she was hospitalized with embolic stroke and then placed back on long-term Coumadin. In October 2011 she was admitted with a GI bleed secondary to erosive duodenitis. Since. He, has been doing well.  She has had back surgery by Dr. Inda Merlin as well as patellar fracture surgery in 2015.  In December 2016 she went back into atrial fibrillation and underwent a TEE cardioversion by myself which was successful. She feels better in sinus rhythm. Decreased exertional dyspnea.  In July 2017 she underwent thyroid biopsy and had Lovenox bridge because of her prior history of stroke when coming off of Coumadin.  Sometimes at home her blood pressure monitor will show heart rates in the upper 40s.  Cecille Rubin Gerhardt's note from 02/27/17 was reviewed and she stated that other than feeling dizzy all the time she feels okay. Persistent. She was very unsure about her medicines. No chest pain. She has not been to see her primary care physician.  Has hemorrhoids. Dr. Johney Maine. Trying to avoid surgery. She notes that she's been taking her medications. She no longer is necessarily feeling the dizziness. Hard to obtain a clear history. Continue with current medication doses.  Past Medical History:  Diagnosis Date  . Anemia    Iron deficiency anemia  . Arthritis   . Arthropathy   . Back pain   . Blood in stool   . Chronic kidney disease   . COPD (chronic obstructive pulmonary disease) ([place])    pt denies on 09/27/14  . CVA (cerebral  vascular accident) ([place])   . Erosive gastritis    with GI bleed thought due to elevated INR and duodenitis  . External hemorrhoids without complication   . GERD (gastroesophageal reflux disease)   . H/O hypercholesterolemia    PMH: only  . H/O transfusion of packed red blood cells   . H/O: GI bleed   . Hemorrhage of rectum and anus   . History of embolic stroke 07/3381  . HTN (hypertension)   . Hyperthyroidism   . Hypothyroidism   . Hypothyroidism   . Insomnia    prev tried Trazadone (2014); ambien CR caused hangover; on ambien since 11/2013  . Iron deficiency anemia due to chronic blood loss   . Long-term (current) use of anticoagulants   . Osteoarthritis of both knees   . Osteoarthritis of lumbar spine   . Osteoporosis   . Paroxysmal atrial fibrillation (HCC)   . Peripheral edema   . Postmenopausal   . Primary osteoarthritis of both hips   . Pyuria   . Stroke [facility])     Past Surgical History:  Procedure Laterality Date  . ABDOMINAL HYSTERECTOMY    . BREAST ENHANCEMENT SURGERY    . CARDIOVERSION N/A 12/09/2015   Procedure: CARDIOVERSION;  Surgeon: Jerline Pain, MD;  Location: [place];  Service: Cardiovascular;  Laterality: N/A;  . CHOLECYSTECTOMY  03/11/2012   Procedure: LAPAROSCOPIC CHOLECYSTECTOMY WITH INTRAOPERATIVE CHOLANGIOGRAM;  Surgeon: Joyice Faster. Cornett, MD;  Location: [place];  Service: General;  Laterality: N/A;  . CHOLECYSTECTOMY    . COLONOSCOPY    . CYST EXCISION  11/2014   left chest wall--nodular hidroadenoma  . HEMORRHOID SURGERY    . LUMBAR LAMINECTOMY N/A 09/29/2014   Procedure: L4-5 Decompression, Hemi-Laminectomy,  Removal Free Fragment;  Surgeon: Marybelle Killings, MD;  Location: [place];  Service: Orthopedics;  Laterality: N/A;  . OPERATIVE HYSTEROSCOPY    . ORIF PATELLA Left 10/25/2014   Procedure: OPEN REDUCTION INTERNAL (ORIF) FIXATION LEFT PATELLA;  Surgeon: Marybelle Killings, MD;  Location: [place];  Service: Orthopedics;  Laterality: Left;  . TEE WITHOUT  CARDIOVERSION N/A 12/09/2015   Procedure: TRANSESOPHAGEAL ECHOCARDIOGRAM (TEE);  Surgeon: Jerline Pain, MD;  Location: [facility];  Service: Cardiovascular;  Laterality: N/A;  . WISDOM TOOTH EXTRACTION      Current Medications: Outpatient Medications Prior to Visit  Medication Sig Dispense Refill  . AMBULATORY NON FORMULARY MEDICATION Medication Name: Nitroglycerin ointment 0.125% Insert pea sized amount into rectum 3-4 times a day as needed 30 g 0  . calcium carbonate (TUMS - DOSED IN MG ELEMENTAL CALCIUM) 500 MG chewable tablet Chew 1 tablet by mouth 2 (two) times daily.    Marland Kitchen FERREX 150 150 MG capsule TAKE ONE CAPSULE BY MOUTH EVERY DAY 30 capsule 1  . flecainide (TAMBOCOR) 50 MG tablet Take 1 tablet (50 mg total) by mouth 2 (two) times daily. 60 tablet 9  . gabapentin (NEURONTIN) 100 MG capsule TAKE 2 CAPSULES TWICE A DAY 360 capsule 0  . hydrochlorothiazide (HYDRODIURIL) 25 MG tablet Take 0.5 tablets (12.5 mg total) by mouth daily. 45 tablet 1  . hydrocortisone (ANUSOL-HC) 25 MG suppository PLACE 1 SUPPOSITORY (25 MG TOTAL) RECTALLY AT BEDTIME. 10 suppository 0  . levothyroxine (SYNTHROID, LEVOTHROID) 25 MCG tablet TAKE 1 TABLET DAILY BEFORE BREAKFAST 90 tablet 0  . lisinopril (PRINIVIL,ZESTRIL) 20 MG tablet Take 2 tablets (40 mg total) by mouth daily. 180 tablet 1  . Magnesium 400 MG CAPS Take 1 tablet by mouth daily.    . metoprolol (LOPRESSOR) 25 MG tablet Take 1 tablet (25 mg total) by mouth 2 (two) times daily. 180 tablet 3  . Multiple Vitamin (MULITIVITAMIN WITH MINERALS) TABS Take 1 tablet by mouth daily.    . potassium chloride (K-DUR,KLOR-CON) 10 MEQ tablet Take 10 mEq by mouth 2 (two) times daily.    . pravastatin (PRAVACHOL) 20 MG tablet TAKE 1 TABLET BY MOUTH EVERY EVENING 30 tablet 10  . warfarin (COUMADIN) 5 MG tablet TAKE AS DIRECTED BY COUMADIN CLINIC 80 tablet 1  . zolpidem (AMBIEN CR) 6.25 MG CR tablet TAKE 1 TABLET BY MOUTH EVERY EVENING 90 tablet 0   No  facility-administered medications prior to visit.      Allergies:   Celebrex [celecoxib] and Lipitor [atorvastatin calcium]   Social History   Social History  . Marital status: Married    Spouse name: N/A  . Number of children: N/A  . Years of education: N/A   Social History Main Topics  . Smoking status: Former Smoker    Types: Cigarettes    Quit date: 09/30/2010  . Smokeless tobacco: Never Used  . Alcohol use No  . Drug use: No  . Sexual activity: Not Currently   Other Topics Concern  . Not on file   Social History Narrative   Married, lives with husband.  2 children--one in [place] and one in [place] (daughters)     Family History:  The patient's family  history includes Dementia in her father; Lung cancer in her brother; Stroke in her mother.   ROS:   Please see the history of present illness.    ROS All other systems reviewed and are negative.   PHYSICAL EXAM:   VS:  BP 124/70   Pulse 74   Ht 5\' 6"  (1.676 m)   Wt 117 lb 4 oz (53.2 kg)   SpO2 95%   BMI 18.92 kg/m    GEN: Thin, in no acute distress  HEENT: normal  Neck: no JVD, carotid bruits, or masses Cardiac: RRR; no murmurs, rubs, or gallops,no edema . She does have diminished peripheral pulses with normal ABIs/arterial Dopplers Respiratory:  clear to auscultation bilaterally, normal work of breathing GI: soft, nontender, nondistended, + BS MS: no deformity or atrophy  Skin: warm and dry, no rash Neuro:  Alert and Oriented x 3, Strength and sensation are intact Psych: euthymic mood, full affect  Wt Readings from Last 3 Encounters:  04/12/17 117 lb 4 oz (53.2 kg)  03/05/17 117 lb (53.1 kg)  02/27/17 119 lb 12.8 oz (54.3 kg)      Studies/Labs Reviewed:   EKG:   EKG from 02/27/17 personally reviewed shows sinus rhythm with first-degree AV block heart rate 62 bpm.  Recent Labs: 05/17/2016: ALT 12 10/11/2016: Hemoglobin 12.5 02/27/2017: BUN 19; Creatinine, Ser 0.98; Platelets 216; Potassium  3.9; Sodium 138; TSH 1.160   Lipid Panel    Component Value Date/Time   CHOL 188 05/17/2016 0750   TRIG 110 05/17/2016 0750   HDL 56 05/17/2016 0750   CHOLHDL 3.4 05/17/2016 0750   VLDL 22 05/17/2016 0750   LDLCALC 110 05/17/2016 0750    Additional studies/ records that were reviewed today include:   48 Hour HolterStudy Highlights 09/2016    Rare PACs premature atrial contractions, 22  Average heart rate 70 bpm  No pauses  No adverse arrhythmias  No atrial fibrillation during this 48 hour monitoring.  Candee Furbish, MD     GXT Study Highlights 10/2016    Blood pressure demonstrated a hypertensive response to exercise.  Clinically negative, electrically nondiagnostic for ischemia as pt only achieved 65% maximal HR For HR achieved there were no ST changes to suggest ischemia.        ASSESSMENT:    1. PAF (paroxysmal atrial fibrillation) ([place])   2. Hypercholesterolemia   3. Benign hypertensive heart disease without heart failure   4. Goiter      PLAN:  In order of problems listed above:  Paroxysmal atrial fibrillation  - Last cardioversion in December 2016, single shock with restoration of sinus rhythm.  - Symptomatic atrial fibrillation in the past but she states that sometimes she does not truly feel her atrial fibrillation.  - OK to continue with low-dose flecainide 50 mg twice a day.  - Seems stable without any changes.   Chronic anticoagulation  - On Coumadin therapy.  - Use Lovenox bridge for procedures.  - Has history of embolic stroke off of Coumadin. Very important for her to continue. No bleeding.  History of stroke  - This occurred when she was in sinus rhythm off of Coumadin. Once again reviewed.  Hypertensive heart disease without heart failure  - Doing well, overall well controlled. Medications reviewed. Reducing metoprolol. She monitors at home. Sometimes her home monitor does not pick up.  Decreased peripheral pulses   - Arterial Dopplers showed normal circulation to lower external knees bilaterally in 06/10/15. Leg pain result of back  pain.  Right neck mass  - Once again strongly encouraged her to follow-up with Dr. Tomi Bamberger  Medication Adjustments/Labs and Tests Ordered: Current medicines are reviewed at length with the patient today.  Concerns regarding medicines are outlined above.  Medication changes, Labs and Tests ordered today are listed in the Patient Instructions below. Patient Instructions  Medication Instructions:  The current medical regimen is effective;  continue present plan and medications.  Follow-Up: Follow up in 6 months with Truitt Merle, NP.  You will receive a letter in the mail 2 months before you are due.  Please call us when you receive this letter to schedule your follow up appointment.  Follow up in 1 year with Dr. Marlou Porch.  You will receive a letter in the mail 2 months before you are due.  Please call us when you receive this letter to schedule your follow up appointment.  If you need a refill on your cardiac medications before your next appointment, please call your pharmacy.  Thank you for choosing [facility]!!        Signed, Candee Furbish, MD  04/12/2017 10:36 AM    [facility] [address], [place], [place]  [address] Phone: [phone]; Fax: [phone]

## 2017-04-18 ENCOUNTER — Other Ambulatory Visit: Payer: Self-pay | Admitting: Family Medicine

## 2017-04-22 ENCOUNTER — Other Ambulatory Visit: Payer: Medicare Other

## 2017-04-22 DIAGNOSIS — Z7901 Long term (current) use of anticoagulants: Secondary | ICD-10-CM | POA: Diagnosis not present

## 2017-04-22 DIAGNOSIS — E785 Hyperlipidemia, unspecified: Secondary | ICD-10-CM | POA: Diagnosis not present

## 2017-04-22 DIAGNOSIS — Z5181 Encounter for therapeutic drug level monitoring: Secondary | ICD-10-CM

## 2017-04-22 DIAGNOSIS — I10 Essential (primary) hypertension: Secondary | ICD-10-CM

## 2017-04-22 LAB — LIPID PANEL
CHOLESTEROL: 178 mg/dL (ref <200)
HDL: 63 mg/dL (ref >50)
LDL Cholesterol: 96 mg/dL (ref <100)
TRIGLYCERIDES: 94 mg/dL (ref <150)
Total CHOL/HDL Ratio: 2.8 Ratio (ref <5.0)
VLDL: 19 mg/dL (ref <30)

## 2017-04-22 LAB — COMPREHENSIVE METABOLIC PANEL
ALBUMIN: 4.2 g/dL (ref 3.6–5.1)
ALK PHOS: 50 U/L (ref 33–130)
ALT: 12 U/L (ref 6–29)
AST: 24 U/L (ref 10–35)
BUN: 16 mg/dL (ref 7–25)
CO2: 28 mmol/L (ref 20–31)
CREATININE: 0.89 mg/dL (ref 0.60–0.93)
Calcium: 9.8 mg/dL (ref 8.6–10.4)
Chloride: 99 mmol/L (ref 98–110)
Glucose, Bld: 90 mg/dL (ref 65–99)
POTASSIUM: 3.8 mmol/L (ref 3.5–5.3)
SODIUM: 137 mmol/L (ref 135–146)
TOTAL PROTEIN: 6.6 g/dL (ref 6.1–8.1)
Total Bilirubin: 0.5 mg/dL (ref 0.2–1.2)

## 2017-04-22 LAB — CBC WITH DIFFERENTIAL/PLATELET
BASOS PCT: 1 %
Basophils Absolute: 50 cells/uL (ref 0–200)
Eosinophils Absolute: 350 cells/uL (ref 15–500)
Eosinophils Relative: 7 %
HEMATOCRIT: 42.1 % (ref 35.0–45.0)
Hemoglobin: 13.8 g/dL (ref 11.7–15.5)
LYMPHS PCT: 27 %
Lymphs Abs: 1350 cells/uL (ref 850–3900)
MCH: 31 pg (ref 27.0–33.0)
MCHC: 32.8 g/dL (ref 32.0–36.0)
MCV: 94.6 fL (ref 80.0–100.0)
MONO ABS: 650 {cells}/uL (ref 200–950)
MONOS PCT: 13 %
MPV: 9.4 fL (ref 7.5–12.5)
NEUTROS PCT: 52 %
Neutro Abs: 2600 cells/uL (ref 1500–7800)
PLATELETS: 196 10*3/uL (ref 140–400)
RBC: 4.45 MIL/uL (ref 3.80–5.10)
RDW: 14.2 % (ref 11.0–15.0)
WBC: 5 10*3/uL (ref 4.0–10.5)

## 2017-04-24 NOTE — Progress Notes (Signed)
Chief Complaint  Patient presents with  . Medication Management    Thyroid, HTN, Insomnia. had labs 04/22/2017. no concerns. Had shingrix at [facility].    "I'm fine, in a way".  Has times where she needs to rest her head, feels tired, but other times her energy is good.  Sometimes she lays down to read or takes a nap. Does feel a little more tired in the morning, and stumbling/staggering a little.  Denies any lightheadedness.  Hypothyroidism and thyroid nodules--she no longer sees Dr. Chalmers Cater. Compliant with taking her thyroid medication daily--takes with her blood pressure medications. Takes the calcium at night.  Thyroid ultrasound was last done 06/2016. IMPRESSION: 4.8 cm solid nodule is seen in right thyroid lobe which appears to be enlarged compared to prior exam. Findings meet consensus criteria for biopsy. Ultrasound-guided fine needle aspiration should be considered, as per the consensus statement: Management of Thyroid Nodules Detected at [place]: Society of Radiologists in [place]. Radiology 2005; [id number].  She had thyroid biopsy in 07/2016: CONSISTENT WITH BENIGN FOLLICULAR NODULE ([place] CATEGORY II).  Denies Denies hair/skin changes, no changes in bowels (has chronic constipation, relieved by daily miralax). Denies any changes to the size of the nodule, or any difficulty swallowing--just slight, feels like it is easier to swallow if she looks down a little. Lab Results  Component Value Date   TSH 1.160 02/27/2017   Rectal pain and hemorrhoids:  She has been seeing Dr.Nandigam (GI), who treated her for hemorrhoids.  She was seen last month with worsening pain--was noted to have some tender skin tags, and is sending her to Dr. Michael Boston for surgery. She continues to have rectal pain.  She is s/p banding, without any benefit (couldn't treat all of them, per pt). Denies any rectal bleeding.  Hypertension follow-up: Blood pressures are checked at home  sometimes, running up to 145/90 at the highest; this morning it was 130/84. Calcium channel blocker (diltiazem) was stopped by the cardiologist at her visit 2 months ago, due to complaints of dizziness.  Currently she denies any dizziness.  Sometimes she still finds she staggers slightly, especially in the mornings.  She previously saw PT to help with her gait and vertigo.   Denies headaches, chest pain. Denies side effects of medications. Denies tachycardia or palpitations, doing well on the flecainide. She has atrial fibrillation and is under the care of the cardiologist. She is followed in the coumadin clinic.  Denies any bleeding  Hyperlipidemia follow-up: Patient is reportedly following a low-fat, low cholesterol diet. Compliant with medications and denies medication side effects. She had fasting labs prior to her visit, see below.  Insomnia--requires zolpidem every night. Can't fall asleep without it. Takes it every night, and sleeps well through the night.  She someimes feels tired still in the mornings.  Takes the pill after she is reading in bed, sometimes around midnight.  Wakes up 8-8:30. Denies unusual behavior.   She takes gabapentin 200mg  BID.  She has been on this (or Lyrica) since 03/2015 when having a lot of problems with burning pain in vaginal and rectal area, thinking that perhaps it could represent a neuropathy. She is using estrace from the gynecologist.  Sometimes it causes some burning.   She gets "pricklies"/tingling/burning in her buttocks and thighs. No longer having the severe vaginal burning. She feels like the gabapentin helps reduce this neuropathic pain.  She reports she stopped taking gabapentin during the day.  Taking 2 pills/day, sometimes 3 if discomfort  is "real bad", up to 4 max, in the later afternoon and evening.Marland Kitchen   She would like to see a female gynecologist.   Lost 5# since January, 10-# in the last year. She reports not being as hungry, not eating as  much.  No abdominal pain, earlier satiety or diarrhea.   Got shingrix 04/20/17  PMH, PSH, SH reviewed  Outpatient Encounter Prescriptions as of 04/25/2017  Medication Sig Note  . AMBULATORY NON FORMULARY MEDICATION Medication Name: Nitroglycerin ointment 0.125% Insert pea sized amount into rectum 3-4 times a day as needed   . calcium carbonate (TUMS - DOSED IN MG ELEMENTAL CALCIUM) 500 MG chewable tablet Chew 1 tablet by mouth 2 (two) times daily.   Marland Kitchen ESTRACE VAGINAL 0.1 MG/GM vaginal cream    . FERREX 150 150 MG capsule TAKE ONE CAPSULE BY MOUTH EVERY DAY   . flecainide (TAMBOCOR) 50 MG tablet Take 1 tablet (50 mg total) by mouth 2 (two) times daily.   Marland Kitchen gabapentin (NEURONTIN) 100 MG capsule TAKE 2 CAPSULES TWICE A DAY   . hydrochlorothiazide (HYDRODIURIL) 25 MG tablet Take 0.5 tablets (12.5 mg total) by mouth daily.   . hydrocortisone (ANUSOL-HC) 25 MG suppository PLACE 1 SUPPOSITORY (25 MG TOTAL) RECTALLY AT BEDTIME.   Marland Kitchen levothyroxine (SYNTHROID, LEVOTHROID) 25 MCG tablet TAKE 1 TABLET DAILY BEFORE BREAKFAST   . lisinopril (PRINIVIL,ZESTRIL) 20 MG tablet Take 2 tablets (40 mg total) by mouth daily.   . Magnesium 400 MG CAPS Take 1 tablet by mouth daily.   . metoprolol (LOPRESSOR) 25 MG tablet Take 1 tablet (25 mg total) by mouth 2 (two) times daily.   . Multiple Vitamin (MULITIVITAMIN WITH MINERALS) TABS Take 1 tablet by mouth daily.   . potassium chloride (K-DUR,KLOR-CON) 10 MEQ tablet Take 10 mEq by mouth 2 (two) times daily.   . pravastatin (PRAVACHOL) 20 MG tablet TAKE 1 TABLET BY MOUTH EVERY EVENING   . warfarin (COUMADIN) 5 MG tablet TAKE AS DIRECTED BY COUMADIN CLINIC 01/24/2017: M,W and Fri 5mg  and 2.5mg  other days.  Marland Kitchen zolpidem (AMBIEN CR) 6.25 MG CR tablet TAKE 1 TABLET BY MOUTH EVERY EVENING    No facility-administered encounter medications on file as of 04/25/2017.    Allergies  Allergen Reactions  . Celebrex [Celecoxib] Diarrhea  . Lipitor [Atorvastatin Calcium] Other (See  Comments)    myalgias   ROS:  See HPI.  +weight loss, decreased appetite, rectal and vaginal pain.  Denies URI symptoms, headaches, dizziness, fever, chills, cough, shortness of breath, palpitations, chest pain, nausea, vomiting, urinary complaints.  Denies bleeding. Moods are good.   PHYSICAL EXAM:  BP 140/70   Pulse 68   Resp 16   Ht 5\' 6"  (1.676 m)   Wt 115 lb (52.2 kg)   SpO2 96%   BMI 18.56 kg/m   Wt Readings from Last 3 Encounters:  04/25/17 115 lb (52.2 kg)  04/12/17 117 lb 4 oz (53.2 kg)  03/05/17 117 lb (53.1 kg)   Well appearing, thin, elderly female in no distress.  She is in good spirits.  She isn't the best historian--sometimes seems confused, but if the same question is asked again, she seems to be able to focus and give a more reliable answer (a specific blood pressure, rather than being very vague, for example).  While at times she seems confused (thought the CCB was stopped earlier, but in truth, she had 2 different doses, and one had been stopped earlier), other times she is quite clear in  when she takes her medications, just needs to be asked a few different times/ways. HEENT: conjunctiva and sclera are clear, EOMI, OP clear Neck: right sided thyromegaly/nodules, nontender, unchanged in consistency of gland.  No lymphadenopathy or carotid bruit Heart: regular rate and rhythm Lungs: clear bilaterally Back: no cVA tenderness Abdomen: soft, nontender, no organomegaly or mass Extremities: no edema, normal pulses Skin: normal turgor, no visible rash Psych: normal mood, affect, hygiene and grooming.  Alert, oriented.     Chemistry      Component Value Date/Time   NA 137 04/22/2017 0919   NA 138 02/27/2017 1057   K 3.8 04/22/2017 0919   CL 99 04/22/2017 0919   CO2 28 04/22/2017 0919   BUN 16 04/22/2017 0919   BUN 19 02/27/2017 1057   CREATININE 0.89 04/22/2017 0919      Component Value Date/Time   CALCIUM 9.8 04/22/2017 0919   ALKPHOS 50 04/22/2017 0919    AST 24 04/22/2017 0919   ALT 12 04/22/2017 0919   BILITOT 0.5 04/22/2017 0919     Fasting glucose 90  Lab Results  Component Value Date   WBC 5.0 04/22/2017   HGB 13.8 04/22/2017   HCT 42.1 04/22/2017   MCV 94.6 04/22/2017   PLT 196 04/22/2017   Lab Results  Component Value Date   CHOL 178 04/22/2017   HDL 63 04/22/2017   LDLCALC 96 04/22/2017   TRIG 94 04/22/2017   CHOLHDL 2.8 04/22/2017   ASSESSMENT/PLAN:  Essential hypertension, benign - borderline; continue current meds and monitoring at home  Hypothyroidism, unspecified type - on correct dose.  recheck [place] this summer to f/u on nodues to ensure no change  Dyslipidemia - controlled  Atrial fibrillation, unspecified type ([place]) - in NSR on flecainide, doing well; continue anticoag  Neuropathic pain - gabapentin seems efffective, avoiding during the day to minimize sedation  Insomnia, unspecified type - requires daily ambien CR--her taking it so late at night may affect her energy and "staggering" in the morning--advised to take it earlier  Multiple thyroid nodules - Plan: US THYROID   f/u US 07/2017   F/u AWV 6 mos  Reminded to get second Shingrix dose in June (from pharmacy).    I want you to take the ambien (zolpidem) earlier in the evening--midnight is too late.  Take it by 9:30-10 (BEFORE getting into bed).  It stays in your system for a good 8 hours, so taking it too late at night may affect your thinking in the morning (groggy, confused).    It is important that you eat enough food throughout the day so that you don't continue to lose weight. Even if you aren't hungry, you still need to eat at least 5 times/day (vs 3 larger meals with snack).  If not eating a meal, at least have an Ensure shake or a protein bar.    2nd Shingrix will be due 06/20/17--get from the pharmacy.  Female gynecologists--not sure who takes your insurance, but here are some names: Dr. Juluis Rainier [facility]). Dr.  Helane Rima Dr. Quincy Simmonds, Dr. Sabra Heck (same [place], near [facility]). Dr. Dellis Filbert is another female option (moving office soon, to be with [place] on [address]).

## 2017-04-25 ENCOUNTER — Encounter: Payer: Self-pay | Admitting: Family Medicine

## 2017-04-25 ENCOUNTER — Ambulatory Visit (INDEPENDENT_AMBULATORY_CARE_PROVIDER_SITE_OTHER): Payer: Medicare Other | Admitting: Family Medicine

## 2017-04-25 VITALS — BP 140/70 | HR 68 | Resp 16 | Ht 66.0 in | Wt 115.0 lb

## 2017-04-25 DIAGNOSIS — E042 Nontoxic multinodular goiter: Secondary | ICD-10-CM | POA: Diagnosis not present

## 2017-04-25 DIAGNOSIS — G4700 Insomnia, unspecified: Secondary | ICD-10-CM | POA: Diagnosis not present

## 2017-04-25 DIAGNOSIS — M792 Neuralgia and neuritis, unspecified: Secondary | ICD-10-CM | POA: Diagnosis not present

## 2017-04-25 DIAGNOSIS — E785 Hyperlipidemia, unspecified: Secondary | ICD-10-CM | POA: Diagnosis not present

## 2017-04-25 DIAGNOSIS — I10 Essential (primary) hypertension: Secondary | ICD-10-CM

## 2017-04-25 DIAGNOSIS — E039 Hypothyroidism, unspecified: Secondary | ICD-10-CM | POA: Diagnosis not present

## 2017-04-25 DIAGNOSIS — I4891 Unspecified atrial fibrillation: Secondary | ICD-10-CM | POA: Diagnosis not present

## 2017-04-25 NOTE — Patient Instructions (Addendum)
Continue all of your current medications.  I want you to take the ambien (zolpidem) earlier in the evening--midnight is too late.  Take it by 9:30-10 (BEFORE getting into bed).  It stays in your system for a good 8 hours, so taking it too late at night may affect your thinking in the morning (groggy, confused).   It is important that you eat enough food throughout the day so that you don't continue to lose weight. Even if you aren't hungry, you still need to eat at least 5 times/day (vs 3 larger meals with snack).  If not eating a meal, at least have an Ensure shake or a protein bar.    2nd Shingrix will be due 06/20/17--get from the pharmacy.  Female gynecologists--not sure who takes your insurance, but here are some names: Dr. Juluis Rainier [facility]). Dr. Helane Rima Dr. Quincy Simmonds, Dr. Sabra Heck (same [place], near [facility]). Dr. Dellis Filbert is another female option (moving office soon, to be with [place] on [address]).

## 2017-04-26 ENCOUNTER — Telehealth: Payer: Self-pay | Admitting: Family Medicine

## 2017-04-26 NOTE — Telephone Encounter (Signed)
Pt called insisting that she is supposed to make an appointment with Dr Tomi Bamberger to remove a spot on her neck. Asked if this is related to her thyroid since notes did indicate pt has order to get thyroid ultrasound so asking if she was possibly attempting to schedule thyroid appointments but she was supposed to see Dr Tomi Bamberger to have a spot removed. Pt is scheduled on 05/01/17. Is this correct?

## 2017-04-26 NOTE — Telephone Encounter (Signed)
I believe she just needs appointment for thyroid ultrasound in AUGUST (a year from the last one).   We did not discuss anything to be removed or any "spot", just the thyroid nodules. Unless it is something new that she didn't discuss with me, I don't think she needs that 5/2 appt

## 2017-04-28 ENCOUNTER — Emergency Department (HOSPITAL_COMMUNITY): Payer: Medicare Other

## 2017-04-28 ENCOUNTER — Encounter (HOSPITAL_COMMUNITY): Payer: Self-pay | Admitting: Emergency Medicine

## 2017-04-28 ENCOUNTER — Emergency Department (HOSPITAL_COMMUNITY)
Admission: EM | Admit: 2017-04-28 | Discharge: 2017-04-30 | Disposition: E | Payer: Medicare Other | Attending: Emergency Medicine | Admitting: Emergency Medicine

## 2017-04-28 DIAGNOSIS — I611 Nontraumatic intracerebral hemorrhage in hemisphere, cortical: Secondary | ICD-10-CM

## 2017-04-28 DIAGNOSIS — Z7901 Long term (current) use of anticoagulants: Secondary | ICD-10-CM | POA: Insufficient documentation

## 2017-04-28 DIAGNOSIS — Z87891 Personal history of nicotine dependence: Secondary | ICD-10-CM | POA: Insufficient documentation

## 2017-04-28 DIAGNOSIS — J449 Chronic obstructive pulmonary disease, unspecified: Secondary | ICD-10-CM | POA: Insufficient documentation

## 2017-04-28 DIAGNOSIS — I639 Cerebral infarction, unspecified: Secondary | ICD-10-CM | POA: Insufficient documentation

## 2017-04-28 DIAGNOSIS — J984 Other disorders of lung: Secondary | ICD-10-CM | POA: Diagnosis not present

## 2017-04-28 DIAGNOSIS — Z79899 Other long term (current) drug therapy: Secondary | ICD-10-CM | POA: Insufficient documentation

## 2017-04-28 DIAGNOSIS — R4182 Altered mental status, unspecified: Secondary | ICD-10-CM | POA: Diagnosis not present

## 2017-04-28 DIAGNOSIS — R29810 Facial weakness: Secondary | ICD-10-CM | POA: Diagnosis not present

## 2017-04-28 DIAGNOSIS — I6789 Other cerebrovascular disease: Secondary | ICD-10-CM | POA: Diagnosis not present

## 2017-04-28 DIAGNOSIS — I619 Nontraumatic intracerebral hemorrhage, unspecified: Secondary | ICD-10-CM | POA: Diagnosis not present

## 2017-04-28 DIAGNOSIS — E039 Hypothyroidism, unspecified: Secondary | ICD-10-CM | POA: Diagnosis not present

## 2017-04-28 LAB — COMPREHENSIVE METABOLIC PANEL
ALT: 13 U/L — ABNORMAL LOW (ref 14–54)
ANION GAP: 16 — AB (ref 5–15)
AST: 29 U/L (ref 15–41)
Albumin: 4.7 g/dL (ref 3.5–5.0)
Alkaline Phosphatase: 55 U/L (ref 38–126)
BILIRUBIN TOTAL: 0.6 mg/dL (ref 0.3–1.2)
BUN: 15 mg/dL (ref 6–20)
CHLORIDE: 96 mmol/L — AB (ref 101–111)
CO2: 21 mmol/L — ABNORMAL LOW (ref 22–32)
Calcium: 9.8 mg/dL (ref 8.9–10.3)
Creatinine, Ser: 0.97 mg/dL (ref 0.44–1.00)
GFR, EST NON AFRICAN AMERICAN: 54 mL/min — AB (ref >60)
Glucose, Bld: 169 mg/dL — ABNORMAL HIGH (ref 65–99)
POTASSIUM: 3.1 mmol/L — AB (ref 3.5–5.1)
Sodium: 133 mmol/L — ABNORMAL LOW (ref 135–145)
TOTAL PROTEIN: 7 g/dL (ref 6.5–8.1)

## 2017-04-28 LAB — CBC
HEMATOCRIT: 44.7 % (ref 36.0–46.0)
Hemoglobin: 15.1 g/dL — ABNORMAL HIGH (ref 12.0–15.0)
MCH: 31.3 pg (ref 26.0–34.0)
MCHC: 33.8 g/dL (ref 30.0–36.0)
MCV: 92.5 fL (ref 78.0–100.0)
Platelets: 215 10*3/uL (ref 150–400)
RBC: 4.83 MIL/uL (ref 3.87–5.11)
RDW: 14.1 % (ref 11.5–15.5)
WBC: 10.3 10*3/uL (ref 4.0–10.5)

## 2017-04-28 LAB — DIFFERENTIAL
BASOS ABS: 0.1 10*3/uL (ref 0.0–0.1)
BASOS PCT: 1 %
EOS ABS: 0.4 10*3/uL (ref 0.0–0.7)
EOS PCT: 4 %
LYMPHS ABS: 1.7 10*3/uL (ref 0.7–4.0)
Lymphocytes Relative: 17 %
MONO ABS: 0.4 10*3/uL (ref 0.1–1.0)
MONOS PCT: 4 %
Neutro Abs: 7.7 10*3/uL (ref 1.7–7.7)
Neutrophils Relative %: 74 %

## 2017-04-28 LAB — I-STAT CHEM 8, ED
BUN: 18 mg/dL (ref 6–20)
CREATININE: 0.9 mg/dL (ref 0.44–1.00)
Calcium, Ion: 1.05 mmol/L — ABNORMAL LOW (ref 1.15–1.40)
Chloride: 100 mmol/L — ABNORMAL LOW (ref 101–111)
Glucose, Bld: 182 mg/dL — ABNORMAL HIGH (ref 65–99)
HCT: 46 % (ref 36.0–46.0)
HEMOGLOBIN: 15.6 g/dL — AB (ref 12.0–15.0)
Potassium: 3 mmol/L — ABNORMAL LOW (ref 3.5–5.1)
Sodium: 135 mmol/L (ref 135–145)
TCO2: 26 mmol/L (ref 0–100)

## 2017-04-28 LAB — I-STAT TROPONIN, ED: TROPONIN I, POC: 0 ng/mL (ref 0.00–0.08)

## 2017-04-28 LAB — PROTIME-INR
INR: 2.23
Prothrombin Time: 25.1 seconds — ABNORMAL HIGH (ref 11.4–15.2)

## 2017-04-28 LAB — APTT: APTT: 56 s — AB (ref 24–36)

## 2017-04-28 MED ORDER — NICARDIPINE HCL IN NACL 20-0.86 MG/200ML-% IV SOLN
INTRAVENOUS | Status: AC
  Administered 2017-04-28: 4 mg
  Filled 2017-04-28: qty 200

## 2017-04-28 MED ORDER — MANNITOL 25 % IV SOLN
25.0000 g | Freq: Once | INTRAVENOUS | Status: AC
  Administered 2017-04-28: 25 g via INTRAVENOUS
  Filled 2017-04-28: qty 100

## 2017-04-28 MED ORDER — ETOMIDATE 2 MG/ML IV SOLN
INTRAVENOUS | Status: AC | PRN
  Administered 2017-04-28: 20 mg via INTRAVENOUS

## 2017-04-28 MED ORDER — SUCCINYLCHOLINE CHLORIDE 20 MG/ML IJ SOLN
INTRAMUSCULAR | Status: AC | PRN
  Administered 2017-04-28: 100 mg via INTRAVENOUS

## 2017-04-30 NOTE — ED Notes (Addendum)
Pt to ED via [facility] from home with ems stating that pt was last seen normal at 2000 last night, husband was unable to awaken pt normally today-- on arrival- pt was sitting up per ems, leaning to right-- EMS states that pt was responsive to verbal stimuli initially, now unresponsive

## 2017-04-30 NOTE — Progress Notes (Signed)
   May 14, 2017 1400  Clinical Encounter Type  Visited With Patient and family together  Visit Type Follow-up;Spiritual support;Social support;Death  Spiritual Encounters  Spiritual Needs Prayer;Emotional;Grief support  Stress Factors  Family Stress Factors Loss    Prayed with family, provided emotional support and helped to get information for patient placement. Shianna Bally L. Brownie Nehme, MDiv.

## 2017-04-30 NOTE — Progress Notes (Signed)
   May 21, 2017 1200  Clinical Encounter Type  Visited With Patient and family together;Health care provider  Visit Type Follow-up;Spiritual support;Social support  Referral From Physician  Spiritual Encounters  Spiritual Needs Prayer;Emotional  Stress Factors  Patient Stress Factors None identified  Family Stress Factors Family relationships;Health changes    Facilitated updated update with practitioner. Escorted family to trauma C. Provided ministry of presence and prayer. Jaxan Michel L. Volanda Napoleon, MDiv

## 2017-04-30 NOTE — ED Notes (Signed)
Husband arrived and have called chaplain to take him to the family room.

## 2017-04-30 NOTE — Procedures (Signed)
Extubation Procedure Note  Patient Details:   Name: Lisa Lucero DOB: 1935/07/13 MRN: [id number]   Airway Documentation:  Airway 7.5 mm (Active)  Secured at (cm) 25 cm May 15, 2017 11:17 AM  Measured From Teeth 05-15-17 11:17 AM  Secured Location Right May 15, 2017 11:17 AM  Secured By [employer] 05/15/17 11:17 AM  Tube Holder Repositioned Yes May 15, 2017 11:17 AM  Site Condition Dry 05/15/2017 11:17 AM    Evaluation  O2 sats: stable throughout Complications: No apparent complications Patient did tolerate procedure well. Bilateral Breath Sounds: Rhonchi   No   Pt. Was terminally extubated to room air per MD order. Family & RN were present during extubation.   Claretta Fraise 15-May-2017, 12:50 PM

## 2017-04-30 NOTE — Progress Notes (Signed)
   05-26-17 1100  Clinical Encounter Type  Visited With Family;Health care provider  Visit Type Initial;Spiritual support;Social support;Code  Referral From Nurse  Spiritual Encounters  Spiritual Needs Prayer;Emotional  Stress Factors  Patient Stress Factors None identified  Family Stress Factors Health changes;Lack of knowledge    Chaplain responded to page for patient being intubated in Trauma C. IP:PNDL Stroke. Escorted husband to Consult B, provided ministry of presence and emotional support. Malillany Kazlauskas L. Volanda Napoleon, page [phone]

## 2017-04-30 NOTE — ED Notes (Signed)
[place] donor services notified -- message left for call back.

## 2017-04-30 NOTE — ED Notes (Signed)
Pt has no pulses, no spontaneous resp -- Pronounced deceased per Dr. Zenia Resides.

## 2017-04-30 NOTE — Consult Note (Signed)
Neurology Consultation Reason for Consult: Altered mental status Referring Physician: Zenia Resides, a  CC: Altered mental status  History is obtained from: Family  HPI: Lisa Lucero is a 82 y.o. female he was last her normal state of health last night. This morning she was groggy, and then became progressively less responsive. She was brought into the emergency department where she was noted to be lethargic, prior to CT scan she continued to worsen and was intubated. She has a GCS of 5.  Following the patient, she was taken for a stat CT which revealed a massive intracranial hemorrhage. She is on Coumadin.   LKW: 4/28 prior to bed tpa given?: no, ich ICH Score: 3    ROS: Unable to obtain due to altered mental status.   Past Medical History:  Diagnosis Date  . Anemia    Iron deficiency anemia  . Arthritis   . Arthropathy   . Back pain   . Blood in stool   . Chronic kidney disease   . COPD (chronic obstructive pulmonary disease) ([place])    pt denies on 09/27/14  . CVA (cerebral vascular accident) ([place])   . Erosive gastritis    with GI bleed thought due to elevated INR and duodenitis  . External hemorrhoids without complication   . GERD (gastroesophageal reflux disease)   . H/O hypercholesterolemia    PMH: only  . H/O transfusion of packed red blood cells   . H/O: GI bleed   . Hemorrhage of rectum and anus   . History of embolic stroke 08/7988  . HTN (hypertension)   . Hyperthyroidism   . Hypothyroidism   . Hypothyroidism   . Insomnia    prev tried Trazadone (2014); ambien CR caused hangover; on ambien since 11/2013  . Iron deficiency anemia due to chronic blood loss   . Long-term (current) use of anticoagulants   . Osteoarthritis of both knees   . Osteoarthritis of lumbar spine   . Osteoporosis   . Paroxysmal atrial fibrillation (HCC)   . Peripheral edema   . Postmenopausal   . Primary osteoarthritis of both hips   . Pyuria   . Stroke [facility])      Family History   Problem Relation Age of Onset  . Stroke Mother   . Dementia Father   . Hypertension    . Arthritis    . Lung cancer Brother   . Heart attack Neg Hx      Social History:  reports that she quit smoking about 6 years ago. Her smoking use included Cigarettes. She has never used smokeless tobacco. She reports that she does not drink alcohol or use drugs.   Exam: Current vital signs: BP (!) 247/161   Pulse 61   Resp (!) 22   SpO2 100%  Vital signs in last 24 hours: Pulse Rate:  [61] 61 (04/29 1117) Resp:  [22] 22 (04/29 1117) BP: (247)/(161) 247/161 (04/29 1117) SpO2:  [100 %] 100 % (04/29 1117) FiO2 (%):  [100 %] 100 % (04/29 1117)   Physical Exam  Constitutional: Appears well-developed and well-nourished.  Psych: Affect appropriate to situation Eyes: No scleral injection HENT: ET tube in place Head: Normocephalic.  Cardiovascular: Normal rate and regular rhythm.  Respiratory: Ventilated GI: Soft.  No distension. There is no tenderness.  Skin: WDI  Neuro: Mental Status: Patient is obtunded, no purposeful movement, does flex versus withdrawal. Does not follow commands. Cranial Nerves: II: She does not blink to threat, right pupil is  3 mm and reactive, left pupil is 7 mm and sluggish but with some reaction III,IV, VI: Eyes are midline V: VII: Corneals present on the right, absent on the left.  Motor: She has flexion to noxious stimuli and left upper extremity, flexion versus withdrawal in bilateral lower extremities Sensory: As above Cerebellar: Does not perform     I have reviewed labs in epic and the results pertinent to this consultation are: Hypokalemia  I have reviewed the images obtained: CT head-massive ICH with herniation  Impression: 82 year old female with non-survivable ICH. I discussed this with the family and they agreed to make her DO NOT RESUSCITATE. They're waiting for [place] from [place] but then wish to proceed with extubation  and comfort care.  Recommendations: 1) comfort only care   Roland Rack, MD [facility] [phone]  If 7pm- 7am, please page neurology on call as listed in [place].

## 2017-04-30 NOTE — ED Provider Notes (Signed)
[facility] DEPT Provider Note   CSN: [id number] Arrival date & time: 05/13/17  1055     History   Chief Complaint Chief Complaint  Patient presents with  . Cerebrovascular Accident    HPI Lisa Lucero is a 82 y.o. female.  82 year old female presents with altered mental status which began this morning. Patient was confused upon awakening and then EMS was called and this became unresponsive. She does have a history of atrial fibrillation and is on Coumadin. On arrival, patient was protecting her airway but noncommunicative. No further history obtainable due to her current situation      Past Medical History:  Diagnosis Date  . Anemia    Iron deficiency anemia  . Arthritis   . Arthropathy   . Back pain   . Blood in stool   . Chronic kidney disease   . COPD (chronic obstructive pulmonary disease) ([place])    pt denies on 09/27/14  . CVA (cerebral vascular accident) ([place])   . Erosive gastritis    with GI bleed thought due to elevated INR and duodenitis  . External hemorrhoids without complication   . GERD (gastroesophageal reflux disease)   . H/O hypercholesterolemia    PMH: only  . H/O transfusion of packed red blood cells   . H/O: GI bleed   . Hemorrhage of rectum and anus   . History of embolic stroke 0/0867  . HTN (hypertension)   . Hyperthyroidism   . Hypothyroidism   . Hypothyroidism   . Insomnia    prev tried Trazadone (2014); ambien CR caused hangover; on ambien since 11/2013  . Iron deficiency anemia due to chronic blood loss   . Long-term (current) use of anticoagulants   . Osteoarthritis of both knees   . Osteoarthritis of lumbar spine   . Osteoporosis   . Paroxysmal atrial fibrillation (H[facility])   . Peripheral edema   . Postmenopausal   . Primary osteoarthritis of both hips   . Pyuria   . Stroke [facility])     Patient Active Problem List   Diagnosis Date Noted  . Hypothyroidism 12/18/2015  . Multiple thyroid nodules 12/18/2015  . Essential  hypertension, benign 11/15/2015  . Chronic leg pain 06/09/2015  . Aortic atherosclerosis ([place]) 01/13/2015  . Left patella fracture 10/25/2014  . Encounter for therapeutic drug monitoring 02/19/2014  . Gallstone 03/08/2012  . Hemorrhagic cystitis 03/08/2012  . Leukocytosis 03/08/2012  . Hyponatremia 03/08/2012  . Hypochloremia 03/08/2012  . Hypokalemia 03/08/2012  . Benign hypertensive heart disease without heart failure 05/17/2011  . Dyslipidemia 05/17/2011  . History of embolic stroke   . Hyperthyroidism   . Long-term (current) use of anticoagulants   . Atrial fibrillation ([place]) 04/23/2011    Past Surgical History:  Procedure Laterality Date  . ABDOMINAL HYSTERECTOMY    . BREAST ENHANCEMENT SURGERY    . CARDIOVERSION N/A 12/09/2015   Procedure: CARDIOVERSION;  Surgeon: Jerline Pain, MD;  Location: [place];  Service: Cardiovascular;  Laterality: N/A;  . CHOLECYSTECTOMY  03/11/2012   Procedure: LAPAROSCOPIC CHOLECYSTECTOMY WITH INTRAOPERATIVE CHOLANGIOGRAM;  Surgeon: Joyice Faster. Cornett, MD;  Location: [place];  Service: General;  Laterality: N/A;  . CHOLECYSTECTOMY    . COLONOSCOPY    . CYST EXCISION  11/2014   left chest wall--nodular hidroadenoma  . HEMORRHOID SURGERY    . LUMBAR LAMINECTOMY N/A 09/29/2014   Procedure: L4-5 Decompression, Hemi-Laminectomy,  Removal Free Fragment;  Surgeon: Marybelle Killings, MD;  Location: [facility];  Service: Orthopedics;  Laterality: N/A;  . OPERATIVE HYSTEROSCOPY    . ORIF PATELLA Left 10/25/2014   Procedure: OPEN REDUCTION INTERNAL (ORIF) FIXATION LEFT PATELLA;  Surgeon: Marybelle Killings, MD;  Location: [place] [facility];  Service: Orthopedics;  Laterality: Left;  . TEE WITHOUT CARDIOVERSION N/A 12/09/2015   Procedure: TRANSESOPHAGEAL ECHOCARDIOGRAM (TEE);  Surgeon: Jerline Pain, MD;  Location[facility];  Service: Cardiovascular;  Laterality: N/A;  . WISDOM TOOTH EXTRACTION      OB History    No data available       Home Medications    Prior to  Admission medications   Medication Sig Start Date End Date Taking? Authorizing Provider  AMBULATORY NON FORMULARY MEDICATION Medication Name: Nitroglycerin ointment 0.125% Insert pea sized amount into rectum 3-4 times a day as needed 01/08/17   Gatha Mayer, MD  calcium carbonate (TUMS - DOSED IN MG ELEMENTAL CALCIUM) 500 MG chewable tablet Chew 1 tablet by mouth 2 (two) times daily.    Historical Provider, MD  ESTRACE VAGINAL 0.1 MG/GM vaginal cream  04/24/17   Historical Provider, MD  FERREX 150 150 MG capsule TAKE ONE CAPSULE BY MOUTH EVERY DAY 01/01/17   Rita Ohara, MD  flecainide (TAMBOCOR) 50 MG tablet Take 1 tablet (50 mg total) by mouth 2 (two) times daily. 03/21/17   Jerline Pain, MD  gabapentin (NEURONTIN) 100 MG capsule TAKE 2 CAPSULES TWICE A DAY 04/22/17   Rita Ohara, MD  hydrochlorothiazide (HYDRODIURIL) 25 MG tablet Take 0.5 tablets (12.5 mg total) by mouth daily. 01/24/17   Rita Ohara, MD  hydrocortisone (ANUSOL-HC) 25 MG suppository PLACE 1 SUPPOSITORY (25 MG TOTAL) RECTALLY AT BEDTIME. 03/25/17   Mauri Pole, MD  levothyroxine (SYNTHROID, LEVOTHROID) 25 MCG tablet TAKE 1 TABLET DAILY BEFORE BREAKFAST 02/25/17   Rita Ohara, MD  lisinopril (PRINIVIL,ZESTRIL) 20 MG tablet Take 2 tablets (40 mg total) by mouth daily. 11/01/16   Isaiah Serge, NP  Magnesium 400 MG CAPS Take 1 tablet by mouth daily.    Historical Provider, MD  metoprolol (LOPRESSOR) 25 MG tablet Take 1 tablet (25 mg total) by mouth 2 (two) times daily. 08/29/16   Jerline Pain, MD  Multiple Vitamin (MULITIVITAMIN WITH MINERALS) TABS Take 1 tablet by mouth daily.    Historical Provider, MD  potassium chloride (K-DUR,KLOR-CON) 10 MEQ tablet Take 10 mEq by mouth 2 (two) times daily.    Historical Provider, MD  pravastatin (PRAVACHOL) 20 MG tablet TAKE 1 TABLET BY MOUTH EVERY EVENING 11/20/16   Jerline Pain, MD  warfarin (COUMADIN) 5 MG tablet TAKE AS DIRECTED BY COUMADIN CLINIC 11/09/16   Jerline Pain, MD  zolpidem (AMBIEN  CR) 6.25 MG CR tablet TAKE 1 TABLET BY MOUTH EVERY EVENING 03/28/17   Rita Ohara, MD    Family History Family History  Problem Relation Age of Onset  . Stroke Mother   . Dementia Father   . Hypertension    . Arthritis    . Lung cancer Brother   . Heart attack Neg Hx     Social History Social History  Substance Use Topics  . Smoking status: Former Smoker    Types: Cigarettes    Quit date: 09/30/2010  . Smokeless tobacco: Never Used  . Alcohol use No     Allergies   Celebrex [celecoxib] and Lipitor [atorvastatin calcium]   Review of Systems Review of Systems  Unable to perform ROS: Acuity of condition     Physical Exam Updated Vital Signs There  were no vitals taken for this visit.  Physical Exam  Constitutional: She appears cachectic. She has a sickly appearance. She appears ill.  HENT:  Head: Normocephalic and atraumatic.  Eyes: Conjunctivae, EOM and lids are normal. Pupils are equal, round, and reactive to light.  Neck: Normal range of motion. Neck supple. No tracheal deviation present. No thyroid mass present.  Cardiovascular: Normal rate, regular rhythm and normal heart sounds.  Exam reveals no gallop.   No murmur heard. Pulmonary/Chest: Effort normal and breath sounds normal. No stridor. No respiratory distress. She has no decreased breath sounds. She has no wheezes. She has no rhonchi. She has no rales.  Abdominal: Soft. Normal appearance and bowel sounds are normal. She exhibits no distension. There is no tenderness. There is no rebound and no CVA tenderness.  Musculoskeletal: Normal range of motion. She exhibits no edema or tenderness.  Neurological: She is unresponsive. She exhibits normal muscle tone. GCS eye subscore is 1. GCS verbal subscore is 1. GCS motor subscore is 2.  Skin: Skin is warm and dry. No abrasion and no rash noted.  Psychiatric: She has a normal mood and affect. Her speech is normal and behavior is normal.  Nursing note and vitals  reviewed.    ED Treatments / Results  Labs (all labs ordered are listed, but only abnormal results are displayed) Labs Reviewed  PROTIME-INR  APTT  CBC  DIFFERENTIAL  COMPREHENSIVE METABOLIC PANEL  I-STAT [place], ED  I-STAT CHEM 8, ED  CBG MONITORING, ED    EKG  EKG Interpretation None       Radiology No results found.  Procedures Procedures (including critical care time)  Medications Ordered in ED Medications  niCARdipine in saline (CARDENE-IV) 20-0.86 MG/200ML-% infusion SOLN (not administered)  mannitol 25 % injection 25 g (not administered)     Initial Impression / Assessment and Plan / ED Course  I have reviewed the triage vital signs and the nursing notes.  Pertinent labs & imaging results that were available during my care of the patient were reviewed by me and considered in my medical decision making (see chart for details).     Patient initially arrived in the ER, she was initially protecting her airway and went to have a CT scan of her head. While in the CT scanner, patient became unresponsive with agonal respirations. She was taken back to the trauma room where she was intubated for airway protection. She was noted to have unequal pupils at this time with her left pupil being approximately 4 mm in her right pupil being approximately 2 mm. She was ordered mannitol and then settings were given. She was noted to have bleeding from her nose. She went back and had a CT scan of her head which showed a very large intracranial bleed with evidence of a herniation. I discussed the patient's case with the neurologist as well as with the family. The decision was date to make her comfort care. She was extubated by respiratory and pronounced deceased at 1:37 PM. I spoke with the patient's primary care doctor, Dr. Rita Ohara, and she will sign the death certificate   CRITICAL CARE Performed by: Leota Jacobsen Total critical care time: 80 minutes Critical care time  was exclusive of separately billable procedures and treating other patients. Critical care was necessary to treat or prevent imminent or life-threatening deterioration. Critical care was time spent personally by me on the following activities: development of treatment plan with patient and/or surrogate as well as nursing,  discussions with consultants, evaluation of patient's response to treatment, examination of patient, obtaining history from patient or surrogate, ordering and performing treatments and interventions, ordering and review of laboratory studies, ordering and review of radiographic studies, pulse oximetry and re-evaluation of patient's condition.   Final Clinical Impressions(s) / ED Diagnoses   Final diagnoses:  None    New Prescriptions New Prescriptions   No medications on file     Lacretia Leigh, MD May 11, 2017 1357

## 2017-04-30 NOTE — ED Triage Notes (Signed)
See neuro assessment and initial rapid response notes

## 2017-04-30 NOTE — Progress Notes (Signed)
MD states that pt. Is now a DNR & there will not be any escalation of care. Waiting on family to come in for terminal extubation.

## 2017-04-30 DEATH — deceased

## 2017-05-01 ENCOUNTER — Telehealth: Payer: Self-pay | Admitting: Family Medicine

## 2017-05-01 ENCOUNTER — Ambulatory Visit: Payer: Medicare Other | Admitting: Family Medicine

## 2017-05-01 NOTE — Telephone Encounter (Signed)
Nurse, learning disability for [employer] and Barbarann Ehlers dropped of death certificate to be completed. I will place on Dr. Johnsie Kindred desk for completion. Please call number on letter attached when complete.

## 2017-05-02 MED FILL — Medication: Qty: 1 | Status: AC

## 2017-10-16 ENCOUNTER — Ambulatory Visit: Payer: Medicare Other | Admitting: Family Medicine
